# Patient Record
Sex: Female | Born: 1990 | Hispanic: No | Marital: Single | Smoking: Current every day smoker
Health system: Southern US, Community
[De-identification: ages and names within clinical notes are randomized; demographics above are authoritative.]

## PROBLEM LIST (undated history)

## (undated) DIAGNOSIS — Z87898 Personal history of other specified conditions: Secondary | ICD-10-CM

## (undated) DIAGNOSIS — F419 Anxiety disorder, unspecified: Secondary | ICD-10-CM

## (undated) DIAGNOSIS — K219 Gastro-esophageal reflux disease without esophagitis: Secondary | ICD-10-CM

## (undated) DIAGNOSIS — T7840XA Allergy, unspecified, initial encounter: Secondary | ICD-10-CM

## (undated) DIAGNOSIS — I459 Conduction disorder, unspecified: Secondary | ICD-10-CM

## (undated) DIAGNOSIS — J45909 Unspecified asthma, uncomplicated: Secondary | ICD-10-CM

## (undated) DIAGNOSIS — N12 Tubulo-interstitial nephritis, not specified as acute or chronic: Secondary | ICD-10-CM

## (undated) DIAGNOSIS — M1990 Unspecified osteoarthritis, unspecified site: Secondary | ICD-10-CM

## (undated) HISTORY — DX: Unspecified osteoarthritis, unspecified site: M19.90

## (undated) HISTORY — DX: Allergy, unspecified, initial encounter: T78.40XA

## (undated) HISTORY — DX: Personal history of other specified conditions: Z87.898

---

## 2015-01-01 ENCOUNTER — Emergency Department: Payer: Self-pay | Admitting: Emergency Medicine

## 2015-01-01 LAB — CBC WITH DIFFERENTIAL/PLATELET
BASOS ABS: 0 10*3/uL (ref 0.0–0.1)
BASOS PCT: 0.3 %
Eosinophil #: 0.1 10*3/uL (ref 0.0–0.7)
Eosinophil %: 1.1 %
HCT: 43.9 % (ref 35.0–47.0)
HGB: 14.1 g/dL (ref 12.0–16.0)
LYMPHS PCT: 11.7 %
Lymphocyte #: 1.1 10*3/uL (ref 1.0–3.6)
MCH: 26.8 pg (ref 26.0–34.0)
MCHC: 32 g/dL (ref 32.0–36.0)
MCV: 84 fL (ref 80–100)
Monocyte #: 0.8 x10 3/mm (ref 0.2–0.9)
Monocyte %: 8.2 %
NEUTROS PCT: 78.7 %
Neutrophil #: 7.4 10*3/uL — ABNORMAL HIGH (ref 1.4–6.5)
Platelet: 220 10*3/uL (ref 150–440)
RBC: 5.25 10*6/uL — ABNORMAL HIGH (ref 3.80–5.20)
RDW: 15.1 % — AB (ref 11.5–14.5)
WBC: 9.4 10*3/uL (ref 3.6–11.0)

## 2015-01-01 LAB — COMPREHENSIVE METABOLIC PANEL
ALBUMIN: 3.3 g/dL — AB (ref 3.4–5.0)
ALT: 34 U/L (ref 14–63)
AST: 24 U/L (ref 15–37)
Alkaline Phosphatase: 70 U/L (ref 46–116)
Anion Gap: 8 (ref 7–16)
BILIRUBIN TOTAL: 0.3 mg/dL (ref 0.2–1.0)
BUN: 10 mg/dL (ref 7–18)
CO2: 23 mmol/L (ref 21–32)
Calcium, Total: 8.6 mg/dL (ref 8.5–10.1)
Chloride: 107 mmol/L (ref 98–107)
Creatinine: 0.55 mg/dL — ABNORMAL LOW (ref 0.60–1.30)
EGFR (Non-African Amer.): >60
Glucose: 109 mg/dL — ABNORMAL HIGH (ref 65–99)
Osmolality: 275 (ref 275–301)
Potassium: 4.1 mmol/L (ref 3.5–5.1)
SODIUM: 138 mmol/L (ref 136–145)
Total Protein: 7.6 g/dL (ref 6.4–8.2)

## 2015-01-01 LAB — URINALYSIS, COMPLETE
Bacteria: NONE SEEN
Bilirubin,UR: NEGATIVE
Glucose,UR: NEGATIVE mg/dL (ref 0–75)
Ketone: NEGATIVE
NITRITE: NEGATIVE
Ph: 5 (ref 4.5–8.0)
Protein: 100
RBC,UR: 7779 /HPF (ref 0–5)
SPECIFIC GRAVITY: 1.032 (ref 1.003–1.030)
Squamous Epithelial: 6

## 2015-01-01 LAB — LIPASE, BLOOD: Lipase: 91 U/L (ref 73–393)

## 2015-04-30 ENCOUNTER — Emergency Department
Admission: EM | Admit: 2015-04-30 | Discharge: 2015-04-30 | Disposition: A | Discharge status: Home / Self Care | Payer: Self-pay | Attending: Emergency Medicine | Admitting: Emergency Medicine

## 2015-04-30 DIAGNOSIS — J029 Acute pharyngitis, unspecified: Secondary | ICD-10-CM | POA: Insufficient documentation

## 2015-04-30 DIAGNOSIS — R300 Dysuria: Secondary | ICD-10-CM | POA: Insufficient documentation

## 2015-04-30 DIAGNOSIS — Z3202 Encounter for pregnancy test, result negative: Secondary | ICD-10-CM | POA: Insufficient documentation

## 2015-04-30 DIAGNOSIS — Z79899 Other long term (current) drug therapy: Secondary | ICD-10-CM | POA: Insufficient documentation

## 2015-04-30 DIAGNOSIS — R109 Unspecified abdominal pain: Secondary | ICD-10-CM | POA: Insufficient documentation

## 2015-04-30 LAB — URINALYSIS COMPLETE WITH MICROSCOPIC (ARMC ONLY)
BILIRUBIN URINE: NEGATIVE
GLUCOSE, UA: NEGATIVE mg/dL
Hgb urine dipstick: NEGATIVE
Ketones, ur: NEGATIVE mg/dL
Nitrite: NEGATIVE
PH: 5 (ref 5.0–8.0)
Protein, ur: NEGATIVE mg/dL
SPECIFIC GRAVITY, URINE: 1.025 (ref 1.005–1.030)

## 2015-04-30 LAB — POCT RAPID STREP A: STREPTOCOCCUS, GROUP A SCREEN (DIRECT): NEGATIVE

## 2015-04-30 LAB — PREGNANCY, URINE: Preg Test, Ur: NEGATIVE

## 2015-04-30 MED ORDER — IBUPROFEN 800 MG PO TABS
800.0000 mg | ORAL_TABLET | Freq: Once | ORAL | Status: AC
  Administered 2015-04-30: 800 mg via ORAL

## 2015-04-30 MED ORDER — METHOCARBAMOL 500 MG PO TABS
1000.0000 mg | ORAL_TABLET | Freq: Once | ORAL | Status: AC
  Administered 2015-04-30: 1000 mg via ORAL

## 2015-04-30 MED ORDER — IBUPROFEN 800 MG PO TABS
ORAL_TABLET | ORAL | Status: AC
  Administered 2015-04-30: 800 mg via ORAL
  Filled 2015-04-30: qty 1

## 2015-04-30 MED ORDER — IBUPROFEN 800 MG PO TABS: 800.0000 mg | ORAL_TABLET | Freq: Three times a day (TID) | ORAL | Status: DC | PRN

## 2015-04-30 MED ORDER — METHOCARBAMOL 500 MG PO TABS
ORAL_TABLET | ORAL | Status: AC
  Administered 2015-04-30: 1000 mg via ORAL
  Filled 2015-04-30: qty 2

## 2015-04-30 MED ORDER — CYCLOBENZAPRINE HCL 10 MG PO TABS: 10.0000 mg | ORAL_TABLET | Freq: Three times a day (TID) | ORAL | Status: DC | PRN

## 2015-04-30 MED ORDER — MAGIC MOUTHWASH W/LIDOCAINE: 5.0000 mL | Freq: Four times a day (QID) | ORAL | Status: DC

## 2015-04-30 MED ORDER — MAGIC MOUTHWASH
10.0000 mL | Freq: Once | ORAL | Status: AC
  Administered 2015-04-30: 10 mL via ORAL
  Filled 2015-04-30: qty 10

## 2015-04-30 NOTE — ED Provider Notes (Signed)
[facility] Emergency Department Provider Note  ____________________________________________  Time seen: Approximately 10:42 PM  I have reviewed the triage vital signs and the nursing notes.   HISTORY  Chief Complaint Abdominal Pain    HPI Samantha Villegas is a 24 y.o. female patient complaining of sore throat chills right flank pain with urinary frequency and intermittent fever. Patient state preschool she is diagnosed bacterial vaginitis and treated. Patient states since then she isn't having increased frequency and mild dysuria. Patient states onset of sore throat for 2 days no other cold symptoms. She denies any nausea vomiting diarrhea. States sore throat increases with solid food but able to tolerate fluids.   No past medical history on file.  There are no active problems to display for this patient.   No past surgical history on file.  Current Outpatient Rx  Name  Route  Sig  Dispense  Refill  . Alum & Mag Hydroxide-Simeth (MAGIC MOUTHWASH W/LIDOCAINE) SOLN   Oral   Take 5 mLs by mouth 4 (four) times daily.   100 mL   0   . cyclobenzaprine (FLEXERIL) 10 MG tablet   Oral   Take 1 tablet (10 mg total) by mouth every 8 (eight) hours as needed for muscle spasms.   15 tablet   0   . ibuprofen (ADVIL,MOTRIN) 800 MG tablet   Oral   Take 1 tablet (800 mg total) by mouth every 8 (eight) hours as needed for moderate pain.   15 tablet   0     Allergies Review of patient's allergies indicates no known allergies.  No family history on file.  Social History History  Substance Use Topics  . Smoking status: Not on file  . Smokeless tobacco: Not on file  . Alcohol Use: Not on file    Review of Systems Constitutional: States chills. Eyes: No visual changes. ENT:  sore throat. Cardiovascular: Denies chest pain. Respiratory: Denies shortness of breath. Gastrointestinal: No abdominal pain.  No nausea, no vomiting.  No diarrhea.  No  constipation. Genitourinary: Positive for dysuria. Musculoskeletal: Positive for right flank pain. Skin: Negative for rash. Neurological: Negative for headaches, focal weakness or numbness. { 10-point ROS otherwise negative.  ____________________________________________   PHYSICAL EXAM:  VITAL SIGNS: ED Triage Vitals  Enc Vitals Group     BP 04/30/15 2102 126/99 mmHg     Pulse Rate 04/30/15 2102 94     Resp 04/30/15 2102 18     Temp 04/30/15 2102 98.9 F (37.2 C)     Temp src --      SpO2 04/30/15 2102 100 %     Weight 04/30/15 2102 263 lb (119.296 kg)     Height 04/30/15 2102  (1.676 m)     Head Cir --      Peak Flow --      Pain Score 04/30/15 2103 8     Pain Loc --      Pain Edu? --      Excl. in GC? --     Constitutional: Alert and oriented. Well appearing and in no acute distress. Eyes: Conjunctivae are normal. PERRL. EOMI. Head: Atraumatic. Nose: No congestion/rhinnorhea. Mouth/Throat: Mucous membranes are moist.  Oropharynx non-erythematous. Edematous tonsils without exudate. Neck: No stridor.  No spinal deformity no guarding with palpation free and equal range of motion. Hematological/Lymphatic/Immunilogical: No cervical lymphadenopathy. Cardiovascular: Normal rate, regular rhythm. Grossly normal heart sounds.  Good peripheral circulation. Respiratory: Normal respiratory effort.  No retractions. Lungs  CTAB. Gastrointestinal: Soft and nontender. No distention. No abdominal bruits. No CVA tenderness. Genitourinary: Mild guarding palpation right CVA area Musculoskeletal: No lower extremity tenderness nor edema.  No joint effusions. Neurologic:  Normal speech and language. No gross focal neurologic deficits are appreciated. Speech is normal. No gait instability. Skin:  Skin is warm, dry and intact. No rash noted. Psychiatric: Mood and affect are normal. Speech and behavior are normal.  ____________________________________________   LABS (all labs ordered  are listed, but only abnormal results are displayed)  Labs Reviewed  URINALYSIS COMPLETEWITH MICROSCOPIC ([facility])  - Abnormal; Notable for the following:    Color, Urine YELLOW (*)    APPearance CLEAR (*)    Leukocytes, UA 1+ (*)    Bacteria, UA RARE (*)    Squamous Epithelial / LPF 0-5 (*)    All other components within normal limits  PREGNANCY, URINE  POCT RAPID STREP A   ____________________________________________  EKG   ____________________________________________  RADIOLOGY   ____________________________________________   PROCEDURES  Procedure(s) performed: None  Critical Care performed: No  ____________________________________________   INITIAL IMPRESSION / ASSESSMENT AND PLAN / ED COURSE  Pertinent labs & imaging results that were available during my care of the patient were reviewed by me and considered in my medical decision making (see chart for details).  Pharyngitis Back pain ____________________________________________   FINAL CLINICAL IMPRESSION(S) / ED DIAGNOSES  Final diagnoses:  Acute pharyngitis, unspecified pharyngitis type  Right flank pain      Joni ReiningRonald K Smith, PA-C 04/30/15 2303  Loleta Roseory Forbach, MD 04/30/15 [phone]

## 2015-04-30 NOTE — ED Notes (Signed)
Pt in with co right flank pain, chills, sore throat, urinary frequency and fever.

## 2015-04-30 NOTE — Discharge Instructions (Signed)
Take medications as directed

## 2015-05-09 LAB — CULTURE, GROUP A STREP (THRC)

## 2015-07-11 LAB — HM HIV SCREENING LAB: HM HIV Screening: NEGATIVE

## 2016-08-23 ENCOUNTER — Emergency Department: Payer: Self-pay

## 2016-08-23 ENCOUNTER — Emergency Department
Admission: EM | Admit: 2016-08-23 | Discharge: 2016-08-23 | Disposition: A | Discharge status: Home / Self Care | Payer: Self-pay | Attending: Emergency Medicine | Admitting: Emergency Medicine

## 2016-08-23 ENCOUNTER — Encounter: Payer: Self-pay | Admitting: Emergency Medicine

## 2016-08-23 DIAGNOSIS — R0789 Other chest pain: Secondary | ICD-10-CM

## 2016-08-23 DIAGNOSIS — Z791 Long term (current) use of non-steroidal anti-inflammatories (NSAID): Secondary | ICD-10-CM | POA: Insufficient documentation

## 2016-08-23 DIAGNOSIS — F17210 Nicotine dependence, cigarettes, uncomplicated: Secondary | ICD-10-CM | POA: Insufficient documentation

## 2016-08-23 DIAGNOSIS — J40 Bronchitis, not specified as acute or chronic: Secondary | ICD-10-CM | POA: Insufficient documentation

## 2016-08-23 HISTORY — DX: Tubulo-interstitial nephritis, not specified as acute or chronic: N12

## 2016-08-23 LAB — BASIC METABOLIC PANEL
Anion gap: 5 (ref 5–15)
BUN: 10 mg/dL (ref 6–20)
CALCIUM: 8.4 mg/dL — AB (ref 8.9–10.3)
CHLORIDE: 108 mmol/L (ref 101–111)
CO2: 24 mmol/L (ref 22–32)
CREATININE: 0.71 mg/dL (ref 0.44–1.00)
GFR calc Af Amer: >60 mL/min (ref >60)
GFR calc non Af Amer: >60 mL/min (ref >60)
GLUCOSE: 102 mg/dL — AB (ref 65–99)
Potassium: 3.7 mmol/L (ref 3.5–5.1)
Sodium: 137 mmol/L (ref 135–145)

## 2016-08-23 LAB — CBC
HCT: 37.6 % (ref 35.0–47.0)
Hemoglobin: 13 g/dL (ref 12.0–16.0)
MCH: 27.4 pg (ref 26.0–34.0)
MCHC: 34.5 g/dL (ref 32.0–36.0)
MCV: 79.5 fL — AB (ref 80.0–100.0)
Platelets: 201 10*3/uL (ref 150–440)
RBC: 4.73 MIL/uL (ref 3.80–5.20)
RDW: 16.2 % — ABNORMAL HIGH (ref 11.5–14.5)
WBC: 4.5 10*3/uL (ref 3.6–11.0)

## 2016-08-23 LAB — TROPONIN I

## 2016-08-23 LAB — URINALYSIS COMPLETE WITH MICROSCOPIC (ARMC ONLY)
BILIRUBIN URINE: NEGATIVE
Glucose, UA: NEGATIVE mg/dL
HGB URINE DIPSTICK: NEGATIVE
Ketones, ur: NEGATIVE mg/dL
Nitrite: NEGATIVE
Protein, ur: NEGATIVE mg/dL
Specific Gravity, Urine: 1.024 (ref 1.005–1.030)
pH: 6 (ref 5.0–8.0)

## 2016-08-23 LAB — LIPASE, BLOOD: LIPASE: 22 U/L (ref 11–51)

## 2016-08-23 LAB — POCT PREGNANCY, URINE: Preg Test, Ur: NEGATIVE

## 2016-08-23 MED ORDER — AZITHROMYCIN 250 MG PO TABS: ORAL_TABLET | ORAL | 0 refills | Status: AC

## 2016-08-23 MED ORDER — KETOROLAC TROMETHAMINE 30 MG/ML IJ SOLN
30.0000 mg | Freq: Once | INTRAMUSCULAR | Status: AC
  Administered 2016-08-23: 30 mg via INTRAVENOUS
  Filled 2016-08-23: qty 1

## 2016-08-23 MED ORDER — ACETAMINOPHEN 325 MG PO TABS
ORAL_TABLET | ORAL | Status: AC
  Filled 2016-08-23: qty 2

## 2016-08-23 MED ORDER — TRAMADOL HCL 50 MG PO TABS: 50.0000 mg | ORAL_TABLET | Freq: Four times a day (QID) | ORAL | 0 refills | Status: DC | PRN

## 2016-08-23 MED ORDER — ACETAMINOPHEN 325 MG PO TABS
650.0000 mg | ORAL_TABLET | Freq: Once | ORAL | Status: AC | PRN
  Administered 2016-08-23: 650 mg via ORAL

## 2016-08-23 MED ORDER — OXYCODONE-ACETAMINOPHEN 5-325 MG PO TABS
1.0000 | ORAL_TABLET | Freq: Once | ORAL | Status: AC
  Administered 2016-08-23: 1 via ORAL
  Filled 2016-08-23: qty 1

## 2016-08-23 MED ORDER — SODIUM CHLORIDE 0.9 % IV BOLUS (SEPSIS)
1000.0000 mL | Freq: Once | INTRAVENOUS | Status: AC
  Administered 2016-08-23: 1000 mL via INTRAVENOUS

## 2016-08-23 NOTE — ED Notes (Signed)
Pt c/o chest pain, midsternal, worse with inspiration, and back pain crossing L to R over where pt states she had a spinal tap done in March 2017. Pt denies any c/o abd pain at this time.

## 2016-08-23 NOTE — ED Provider Notes (Signed)
[facility] Emergency Department Provider Note  ____________________________________________   I have reviewed the triage vital signs and the nursing notes.   HISTORY  Chief Complaint Back Pain and Abdominal Pain    HPI Samantha Villegas is a 25 y.o. female who is healthy aside from morbid obesity, is a smoker. States that she started having a cough a few days ago which is productive of greenish mucus and rhinorrhea. She started having a fever today. She states that when she coughed "hard" this morning she began to have pain in her ribs and the right back. She states that she has not no leg swelling no recent travel no personal or family history of PE or DVT she is not on birth control denies pregnancy.  The pain is sharp, began this point with a cough, to the right ribs. It is worse when she changes position.   Past Medical History:  Diagnosis Date  . Pyelonephritis     There are no active problems to display for this patient.   Past Surgical History:  Procedure Laterality Date  . CESAREAN SECTION      Prior to Admission medications   Medication Sig Start Date End Date Taking? Authorizing Provider  Alum & Mag Hydroxide-Simeth (MAGIC MOUTHWASH W/LIDOCAINE) SOLN Take 5 mLs by mouth 4 (four) times daily. 04/30/15   Joni Reiningonald K Smith, PA-C  cyclobenzaprine (FLEXERIL) 10 MG tablet Take 1 tablet (10 mg total) by mouth every 8 (eight) hours as needed for muscle spasms. 04/30/15   Joni Reiningonald K Smith, PA-C  ibuprofen (ADVIL,MOTRIN) 800 MG tablet Take 1 tablet (800 mg total) by mouth every 8 (eight) hours as needed for moderate pain. 04/30/15   Joni Reiningonald K Smith, PA-C    Allergies Review of patient's allergies indicates no known allergies.  No family history on file.  Social History Social History  Substance Use Topics  . Smoking status: Current Every Day Smoker    Packs/day: 1.00    Types: Cigarettes  . Smokeless tobacco: Never Used  . Alcohol use  No    Review of Systems Constitutional: Positive fever no chills  Eyes: No visual changes. ENT: No sore throat. No stiff neck no neck pain Cardiovascular: Positive chest pain Respiratory: Denies shortness of breath. Positive cough Gastrointestinal:   no vomiting.  No diarrhea.  No constipation. Genitourinary: Negative for dysuria. Musculoskeletal: Negative lower extremity swelling Skin: Negative for rash. Neurological: Negative for severe headaches, focal weakness or numbness. 10-point ROS otherwise negative.  ____________________________________________   PHYSICAL EXAM:  VITAL SIGNS: ED Triage Vitals  Enc Vitals Group     BP 08/23/16 1749 121/69     Pulse Rate 08/23/16 1747 (!) 105     Resp 08/23/16 1747 16     Temp 08/23/16 1747 (!) 103 F (39.4 C)     Temp Source 08/23/16 1747 Oral     SpO2 08/23/16 1747 100 %     Weight 08/23/16 1747 250 lb (113.4 kg)     Height 08/23/16 1747 5\' 5"  (1.651 m)     Head Circumference --      Peak Flow --      Pain Score 08/23/16 1748 9     Pain Loc --      Pain Edu? --      Excl. in GC? --     Constitutional: Alert and oriented. Well appearing and in no acute distress.Walking to the room playing her cell phone and when I enter Eyes: Conjunctivae are  normal. PERRL. EOMI. Head: Atraumatic. Nose: Mild clear congestion/rhinnorhea. Mouth/Throat: Mucous membranes are moist.  Oropharynx non-erythematous. Neck: No stridor.   Nontender with no meningismus Chest: Tender to palpation in the ribs on the right upper mid back. I touch this area patient states "ouch that's the pain right there". There is no CVA tenderness. There is no flail chest. There is no crepitus. Cardiovascular: Normal rate, regular rhythm. Grossly normal heart sounds.  Good peripheral circulation. Respiratory: Normal respiratory effort.  No retractions. Lungs CTAB. Abdominal: Soft and nontender. No distention. No guarding no rebound Back:  See above,  there is no midline  tenderness there are no lesions noted. there is no CVA tenderness Musculoskeletal: No lower extremity tenderness, no upper extremity tenderness. No joint effusions, no DVT signs strong distal pulses no edema Neurologic:  Normal speech and language. No gross focal neurologic deficits are appreciated.  Skin:  Skin is warm, dry and intact. No rash noted. Psychiatric: Mood and affect are normal. Speech and behavior are normal.  ____________________________________________   LABS (all labs ordered are listed, but only abnormal results are displayed)  Labs Reviewed  BASIC METABOLIC PANEL - Abnormal; Notable for the following:       Result Value   Glucose, Bld 102 (*)    Calcium 8.4 (*)    All other components within normal limits  CBC - Abnormal; Notable for the following:    MCV 79.5 (*)    RDW 16.2 (*)    All other components within normal limits  URINALYSIS COMPLETEWITH MICROSCOPIC ([facility] ONLY) - Abnormal; Notable for the following:    Color, Urine YELLOW (*)    APPearance CLEAR (*)    Leukocytes, UA TRACE (*)    Bacteria, UA RARE (*)    Squamous Epithelial / LPF 0-5 (*)    All other components within normal limits  URINE CULTURE  LIPASE, BLOOD  TROPONIN I  POC URINE PREG, ED  POCT PREGNANCY, URINE   ____________________________________________  EKG  I personally interpreted any EKGs ordered by me or triage Normal sinus rhythm at 92 bpm no acute ST elevation or acute ST depression normal axis unremarkable EKG ____________________________________________  RADIOLOGY  I reviewed any imaging ordered by me or triage that were performed during my shift and, if possible, patient and/or family made aware of any abnormal findings. ____________________________________________   PROCEDURES  Procedure(s) performed: None  Procedures  Critical Care performed: None  ____________________________________________   INITIAL IMPRESSION / ASSESSMENT AND PLAN / ED  COURSE  Pertinent labs & imaging results that were available during my care of the patient were reviewed by me and considered in my medical decision making (see chart for details).  Patient with very reproducible chest wall pain in the context of fever rhinorrhea and cough.At this time, there does not appear to be clinical evidence to support the diagnosis of pulmonary embolus, dissection, myocarditis, endocarditis, pericarditis, pericardial tamponade, acute coronary syndrome, pneumothorax, pneumonia, or any other acute intrathoracic pathology that will require admission or acute intervention. Nor is there evidence of any significant intra-abdominal pathology causing this discomfort. Chest x-ray is reassuring. No evidence of pyelonephritis. I do not believe the patient is a blood clot. She is very clear cough fever origin to this refusal chest wall pain. She does have a slight elevated heart rate without the context of a fever. No other obvious origin of fevers noted. Patient does not have any evidence of pyelonephritis her abdomen is completely benign to me and the nurses here  she adamantly denies having any abdominal pain. Serial abdominal exams are reassuring. She also denies vaginal discharge. She is a smoker with a productive cough and high fever will treat her for atypical pneumonia with Zithromax and fever control return precautions and follow-up given and understood.  Clinical Course   ____________________________________________   FINAL CLINICAL IMPRESSION(S) / ED DIAGNOSES  Final diagnoses:  None      This chart was dictated using voice recognition software.  Despite best efforts to proofread,  errors can occur which can change meaning.      [name], MD 08/23/16 [phone]

## 2016-08-23 NOTE — ED Triage Notes (Addendum)
Woke up this morning with low back pain and abdominal pain.  Denies dysuria.  Had urine checked at PCP for about 3 weeks ago, urine negative.  Also c/o cough and pain to chest with coughing and deep breathing.

## 2016-08-25 LAB — URINE CULTURE

## 2016-10-10 ENCOUNTER — Emergency Department
Admission: EM | Admit: 2016-10-10 | Discharge: 2016-10-10 | Disposition: A | Discharge status: Home / Self Care | Payer: Medicaid - Out of State | Attending: Emergency Medicine | Admitting: Emergency Medicine

## 2016-10-10 ENCOUNTER — Encounter: Payer: Self-pay | Admitting: Urgent Care

## 2016-10-10 DIAGNOSIS — N390 Urinary tract infection, site not specified: Secondary | ICD-10-CM | POA: Insufficient documentation

## 2016-10-10 DIAGNOSIS — N3289 Other specified disorders of bladder: Secondary | ICD-10-CM

## 2016-10-10 DIAGNOSIS — Z791 Long term (current) use of non-steroidal anti-inflammatories (NSAID): Secondary | ICD-10-CM | POA: Insufficient documentation

## 2016-10-10 DIAGNOSIS — Z79899 Other long term (current) drug therapy: Secondary | ICD-10-CM | POA: Insufficient documentation

## 2016-10-10 DIAGNOSIS — F17210 Nicotine dependence, cigarettes, uncomplicated: Secondary | ICD-10-CM | POA: Insufficient documentation

## 2016-10-10 LAB — PREGNANCY, URINE: Preg Test, Ur: NEGATIVE

## 2016-10-10 LAB — URINALYSIS COMPLETE WITH MICROSCOPIC (ARMC ONLY)
BILIRUBIN URINE: NEGATIVE
Bacteria, UA: NONE SEEN
GLUCOSE, UA: NEGATIVE mg/dL
NITRITE: NEGATIVE
Protein, ur: NEGATIVE mg/dL
Specific Gravity, Urine: 1.02 (ref 1.005–1.030)
pH: 5 (ref 5.0–8.0)

## 2016-10-10 LAB — GLUCOSE, CAPILLARY: Glucose-Capillary: 81 mg/dL (ref 65–99)

## 2016-10-10 MED ORDER — PHENAZOPYRIDINE HCL 100 MG PO TABS: 95.0000 mg | ORAL_TABLET | Freq: Once | ORAL | Status: DC

## 2016-10-10 MED ORDER — PHENAZOPYRIDINE HCL 200 MG PO TABS
ORAL_TABLET | ORAL | Status: AC
  Filled 2016-10-10: qty 1

## 2016-10-10 MED ORDER — AMOXICILLIN-POT CLAVULANATE 875-125 MG PO TABS: 1.0000 | ORAL_TABLET | Freq: Two times a day (BID) | ORAL | 0 refills | Status: DC

## 2016-10-10 MED ORDER — PHENAZOPYRIDINE HCL 200 MG PO TABS
200.0000 mg | ORAL_TABLET | Freq: Once | ORAL | Status: AC
  Administered 2016-10-10: 200 mg via ORAL

## 2016-10-10 MED ORDER — AMOXICILLIN-POT CLAVULANATE 875-125 MG PO TABS
1.0000 | ORAL_TABLET | Freq: Once | ORAL | Status: DC
  Filled 2016-10-10: qty 1

## 2016-10-10 NOTE — ED Notes (Signed)
Patient reports earlier this evening had lower abdominal pain and urinary pressure and dysuria.  Reports approximately 10 minutes after using the rest room had lower abdomen pain (similar as when she had her IUD placed) and lower back pain.  Patient denies nausea, vomiting or diarrhea.

## 2016-10-10 NOTE — ED Triage Notes (Signed)
Patient presents with dysuria that started yesterday; denies gross hematuria. (+) lower abdominal and back pain that worsened x 30 mins PTA.

## 2016-10-10 NOTE — ED Provider Notes (Signed)
[facility] Emergency Department Provider Note  ____________________________________________   First MD Initiated Contact with Patient 10/10/16 2217     (approximate)  I have reviewed the triage vital signs and the nursing notes.   HISTORY  Chief Complaint Dysuria    HPI Samantha Villegas is a 25 y.o. female this at work today, she went to urinate and noticed it burned when she urinated and then shortly thereafter she developed the severe pain in her lower abdomen with radiation towards her back. She began feeling a slight discomfort with urination yesterday. No blood in her urine. This went away after about 10 minutes, but she reports burning with urination and discomfort and feels she might have a "bladder infection"  Denies nausea or vomiting. No fevers or chills. Not having any pain or discomfort now  No chest pain or shortness of breath. No cough.   Past Medical History:  Diagnosis Date  . Pyelonephritis     There are no active problems to display for this patient.   Past Surgical History:  Procedure Laterality Date  . CESAREAN SECTION      Prior to Admission medications   Medication Sig Start Date End Date Taking? Authorizing Provider  Alum & Mag Hydroxide-Simeth (MAGIC MOUTHWASH W/LIDOCAINE) SOLN Take 5 mLs by mouth 4 (four) times daily. 04/30/15   Joni Reiningonald K Smith, PA-C  amoxicillin-clavulanate (AUGMENTIN) 875-125 MG tablet Take 1 tablet by mouth 2 (two) times daily. 10/10/16   Sharyn CreamerMark Kroy Sprung, MD  cyclobenzaprine (FLEXERIL) 10 MG tablet Take 1 tablet (10 mg total) by mouth every 8 (eight) hours as needed for muscle spasms. 04/30/15   Joni Reiningonald K Smith, PA-C  ibuprofen (ADVIL,MOTRIN) 800 MG tablet Take 1 tablet (800 mg total) by mouth every 8 (eight) hours as needed for moderate pain. 04/30/15   Joni Reiningonald K Smith, PA-C  traMADol (ULTRAM) 50 MG tablet Take 1 tablet (50 mg total) by mouth every 6 (six) hours as needed. 08/23/16 08/23/17  Jeanmarie PlantJames A McShane,  MD    Allergies Review of patient's allergies indicates no known allergies.  No family history on file.  Social History Social History  Substance Use Topics  . Smoking status: Current Every Day Smoker    Packs/day: 1.00    Types: Cigarettes  . Smokeless tobacco: Never Used  . Alcohol use No    Review of Systems Constitutional: No fever/chills Eyes: No visual changes. ENT: No sore throat. Cardiovascular: Denies chest pain. Respiratory: Denies shortness of breath. Gastrointestinal: No nausea, no vomiting.  No diarrhea.  No constipation. Genitourinary: See history of present illness Musculoskeletal: Negative for back pain. Skin: Negative for rash. Neurological: Negative for headaches, focal weakness or numbness.  10-point ROS otherwise negative.  ____________________________________________   PHYSICAL EXAM:  VITAL SIGNS: ED Triage Vitals  Enc Vitals Group     BP 10/10/16 2106 (!) 135/97     Pulse Rate 10/10/16 2106 75     Resp 10/10/16 2106 18     Temp 10/10/16 2106 98.6 F (37 C)     Temp Source 10/10/16 2106 Oral     SpO2 10/10/16 2106 100 %     Weight 10/10/16 2107 250 lb (113.4 kg)     Height 10/10/16 2107 5\' 4"  (1.626 m)     Head Circumference --      Peak Flow --      Pain Score 10/10/16 2107 6     Pain Loc --      Pain Edu? --  Excl. in GC? --     Constitutional: Alert and oriented. Well appearing and in no acute distress. Eyes: Conjunctivae are normal. PERRL. EOMI. Head: Atraumatic. Nose: No congestion/rhinnorhea. Mouth/Throat: Mucous membranes are moist.  Neck: No stridor.   Cardiovascular: Normal rate, regular rhythm. Grossly normal heart sounds.  Good peripheral circulation. Respiratory: Normal respiratory effort.  No retractions. Lungs CTAB. Gastrointestinal: Soft and nontender. No distention. No CVA tenderness. Negative Murphy. No pain at McBurney's point Musculoskeletal: No lower extremity tenderness nor edema.  No joint  effusions. Neurologic:  Normal speech and language. No gross focal neurologic deficits are appreciated. No gait instability. Skin:  Skin is warm, dry and intact. No rash noted. Psychiatric: Mood and affect are normal. Speech and behavior are normal.  ____________________________________________   LABS (all labs ordered are listed, but only abnormal results are displayed)  Labs Reviewed  URINALYSIS COMPLETEWITH MICROSCOPIC ([facility] ONLY) - Abnormal; Notable for the following:       Result Value   Color, Urine YELLOW (*)    APPearance HAZY (*)    Ketones, ur TRACE (*)    Hgb urine dipstick 1+ (*)    Leukocytes, UA 1+ (*)    Squamous Epithelial / LPF 0-5 (*)    All other components within normal limits  URINE CULTURE  PREGNANCY, URINE  GLUCOSE, CAPILLARY  CBG MONITORING, ED   ____________________________________________  EKG   ____________________________________________  RADIOLOGY   ____________________________________________   PROCEDURES  Procedure(s) performed: None  Procedures  Critical Care performed: No  ____________________________________________   INITIAL IMPRESSION / ASSESSMENT AND PLAN / ED COURSE  Pertinent labs & imaging results that were available during my care of the patient were reviewed by me and considered in my medical decision making (see chart for details).  Patient presents for evaluation after 1 day of dysuria, then after urinating today had an episode of severe pain lasted about 10 minutes in the lower abdomen. Appears most consistent with acute urinary tract infection, there is pyuria and the patient reports obvious dysuria. In addition, I suspect she likely had an episode of bladder spasm. No pain in all symptoms now resolved. We'll treat with Augmentin, Azo over-the-counter.  No CVA tenderness. Afebrile in no distress. No evidence of pyelonephritis, sepsis, or overwhelming infection. Patient presently not having any abdominal  pain.  Clinical Course     ____________________________________________   FINAL CLINICAL IMPRESSION(S) / ED DIAGNOSES  Final diagnoses:  Urinary tract infection, acute  Bladder spasm      NEW MEDICATIONS STARTED DURING THIS VISIT:  New Prescriptions   AMOXICILLIN-CLAVULANATE (AUGMENTIN) 875-125 MG TABLET    Take 1 tablet by mouth 2 (two) times daily.     Note:  This document was prepared using Dragon voice recognition software and may include unintentional dictation errors.     Sharyn CreamerMark Chesnee Floren, MD 10/10/16 2259

## 2016-10-10 NOTE — Discharge Instructions (Signed)
You have been seen in the Emergency Department (ED) today for pain when urinating.  Your workup today suggests that you have a urinary tract infection (UTI). ° ° °Call your regular doctor to schedule the next available appointment to follow up on today’s ED visit, or return immediately to the ED if your pain worsens, you have decreased urine production, develop fever, persistent vomiting, or other symptoms that concern you. ° °

## 2016-10-13 LAB — URINE CULTURE
Culture: >=100000 — AB
Special Requests: NORMAL

## 2017-01-04 ENCOUNTER — Emergency Department
Admission: EM | Admit: 2017-01-04 | Discharge: 2017-01-04 | Disposition: A | Discharge status: Home / Self Care | Payer: Self-pay | Attending: Emergency Medicine | Admitting: Emergency Medicine

## 2017-01-04 ENCOUNTER — Encounter: Payer: Self-pay | Admitting: Emergency Medicine

## 2017-01-04 DIAGNOSIS — H6981 Other specified disorders of Eustachian tube, right ear: Secondary | ICD-10-CM

## 2017-01-04 DIAGNOSIS — H6991 Unspecified Eustachian tube disorder, right ear: Secondary | ICD-10-CM | POA: Insufficient documentation

## 2017-01-04 DIAGNOSIS — F17210 Nicotine dependence, cigarettes, uncomplicated: Secondary | ICD-10-CM | POA: Insufficient documentation

## 2017-01-04 MED ORDER — FLUTICASONE PROPIONATE 50 MCG/ACT NA SUSP: 2.0000 | Freq: Every day | NASAL | 0 refills | Status: DC

## 2017-01-04 MED ORDER — PREDNISONE 10 MG PO TABS: ORAL_TABLET | ORAL | 0 refills | Status: DC

## 2017-01-04 NOTE — ED Provider Notes (Signed)
[facility] Emergency Department Provider Note   ____________________________________________   First MD Initiated Contact with Patient 01/04/17 1433     (approximate)  I have reviewed the triage vital signs and the nursing notes.   HISTORY  Chief Complaint Abscess   HPI Samantha Villegas is a 26 y.o. female  is here with complaint of right-sided neck pain. Patient states that she had a toothache approximately one week ago and that that resolved on its own. She continues to have right-sided neck pain. She denies any fever, chills, nausea or vomiting. There is been no sore throat. She has not taken any over-the-counter medication that has helped any. She denies any previous sensations such as this. She does have TMJ but states that this pain is different.   Past Medical History:  Diagnosis Date  . Pyelonephritis     There are no active problems to display for this patient.   Past Surgical History:  Procedure Laterality Date  . CESAREAN SECTION      Prior to Admission medications   Medication Sig Start Date End Date Taking? Authorizing Provider  fluticasone (FLONASE) 50 MCG/ACT nasal spray Place 2 sprays into both nostrils daily. 01/04/17 01/04/18  Tommi Rumps, PA-C  predniSONE (DELTASONE) 10 MG tablet Take 6 tablets  today, on day 2 take 5 tablets, day 3 take 4 tablets, day 4 take 3 tablets, day 5 take  2 tablets and 1 tablet the last day 01/04/17   Tommi Rumps, PA-C    Allergies Patient has no known allergies.  No family history on file.  Social History Social History  Substance Use Topics  . Smoking status: Current Every Day Smoker    Packs/day: 1.00    Types: Cigarettes  . Smokeless tobacco: Never Used  . Alcohol use No    Review of Systems Constitutional: No fever/chills ENT: No sore throat.Questionable right earache. Cardiovascular: Denies chest pain. Respiratory: Denies shortness of breath. Gastrointestinal:   No  nausea, no vomiting.  Skin: Negative for rash. Neurological: Negative for headaches, focal weakness or numbness.  10-point ROS otherwise negative.  ____________________________________________   PHYSICAL EXAM:  VITAL SIGNS: ED Triage Vitals  Enc Vitals Group     BP 01/04/17 1423 132/77     Pulse Rate 01/04/17 1423 78     Resp 01/04/17 1423 18     Temp 01/04/17 1423 97.4 F (36.3 C)     Temp Source 01/04/17 1423 Oral     SpO2 01/04/17 1423 99 %     Weight 01/04/17 1424 250 lb (113.4 kg)     Height 01/04/17 1424 5\' 5"  (1.651 m)     Head Circumference --      Peak Flow --      Pain Score --      Pain Loc --      Pain Edu? --      Excl. in GC? --     Constitutional: Alert and oriented. Well appearing and in no acute distress. Eyes: Conjunctivae are normal. PERRL. EOMI. Head: Atraumatic. Nose: No congestion/rhinnorhea. Left EAC and TM are clear. Right TM is dull with fluid level present. No erythema or injection seen. Patient was unable to get her ears to pop on the right side. Mouth/Throat: Mucous membranes are moist.  Oropharynx non-erythematous. No dental abscess present. Neck: No stridor.   Hematological/Lymphatic/Immunilogical: No cervical lymphadenopathy. Cardiovascular: Normal rate, regular rhythm. Grossly normal heart sounds.  Good peripheral circulation. Respiratory: Normal respiratory effort.  No retractions. Lungs CTAB. Musculoskeletal: Moves upper and lower extremities without any difficulty. Normal gait was noted. Neurologic:  Normal speech and language. No gross focal neurologic deficits are appreciated. No gait instability. Skin:  Skin is warm, dry and intact. No rash noted. Psychiatric: Mood and affect are normal. Speech and behavior are normal.  ____________________________________________   LABS (all labs ordered are listed, but only abnormal results are displayed)  Labs Reviewed - No data to display  PROCEDURES  Procedure(s) performed:  None  Procedures  Critical Care performed: No  ____________________________________________   INITIAL IMPRESSION / ASSESSMENT AND PLAN / ED COURSE  Pertinent labs & imaging results that were available during my care of the patient were reviewed by me and considered in my medical decision making (see chart for details).   Patient was started on prednisone taper along with Flonase nasal spray. Patient is also to get over-the-counter decongestant such as Sudafed if there is any congestion. She is to follow-up with [facility] if any continued problems.    ___________________________________________   FINAL CLINICAL IMPRESSION(S) / ED DIAGNOSES  Final diagnoses:  Eustachian tube dysfunction, right      NEW MEDICATIONS STARTED DURING THIS VISIT:  Discharge Medication List as of 01/04/2017  3:01 PM    START taking these medications   Details  fluticasone (FLONASE) 50 MCG/ACT nasal spray Place 2 sprays into both nostrils daily., Starting Mon 01/04/2017, Until Tue 01/04/2018, Print    predniSONE (DELTASONE) 10 MG tablet Take 6 tablets  today, on day 2 take 5 tablets, day 3 take 4 tablets, day 4 take 3 tablets, day 5 take  2 tablets and 1 tablet the last day, Print         Note:  This document was prepared using Dragon voice recognition software and may include unintentional dictation errors.    Tommi Rumpshonda L Summers, PA-C 01/04/17 1612    Jene Everyobert Kinner, MD 01/05/17 [phone]

## 2017-01-04 NOTE — ED Triage Notes (Signed)
States she had a toothache about 1 week ago  Tooth pain went away  But now having some tenderness to right side of neck

## 2017-01-04 NOTE — Discharge Instructions (Signed)
Follow up with DR. Juengel if any continued greater than 5-7 days Begin prednisone as directed today and flonase nasal spray as directed Tylenol as needed for pain

## 2017-01-16 ENCOUNTER — Emergency Department
Admission: EM | Admit: 2017-01-16 | Discharge: 2017-01-16 | Disposition: A | Discharge status: Home / Self Care | Payer: Self-pay | Attending: Emergency Medicine | Admitting: Emergency Medicine

## 2017-01-16 ENCOUNTER — Encounter: Payer: Self-pay | Admitting: Emergency Medicine

## 2017-01-16 DIAGNOSIS — K047 Periapical abscess without sinus: Secondary | ICD-10-CM | POA: Insufficient documentation

## 2017-01-16 DIAGNOSIS — K029 Dental caries, unspecified: Secondary | ICD-10-CM | POA: Insufficient documentation

## 2017-01-16 DIAGNOSIS — F17210 Nicotine dependence, cigarettes, uncomplicated: Secondary | ICD-10-CM | POA: Insufficient documentation

## 2017-01-16 MED ORDER — PENICILLIN V POTASSIUM 500 MG PO TABS: 500.0000 mg | ORAL_TABLET | Freq: Four times a day (QID) | ORAL | 0 refills | Status: DC

## 2017-01-16 MED ORDER — PENICILLIN V POTASSIUM 500 MG PO TABS
500.0000 mg | ORAL_TABLET | Freq: Once | ORAL | Status: AC
  Administered 2017-01-16: 500 mg via ORAL
  Filled 2017-01-16: qty 1

## 2017-01-16 NOTE — ED Notes (Signed)
Pt verbalized understanding of discharge instructions. NAD at this time. 

## 2017-01-16 NOTE — ED Triage Notes (Signed)
Pt to ed with c/o toothache,  States she was seen here last week for same.  Pt states she was started on prednisone and swelling went away but now swelling has returned.

## 2017-01-16 NOTE — Discharge Instructions (Signed)
Take the antibiotic as directed. Rinse with warm, salty water after every meal. Use a soft-bristled toothbrush twice daily. Consider using a temporary filling to close the broken tooth until you can be seen by one of the dental clinics listed below: OPTIONS FOR DENTAL FOLLOW UP CARE  [place] Department of Health and Human Services - Local Safety Net Dental Clinics TripDoors.comhttp://www.ncdhhs.gov/dph/oralhealth/services/safetynetclinics.htm   [facility] [phone])  [facility] [phone])  [facility] [phone]  [facility] [phone])  [facility] [phone]) This clinic caters to the indigent population and is on a lottery system. Location: [employer], [address], [address], [place] [facility] Hours: Wednesdays from 6pm - 9pm, patients seen by a lottery system. For dates, call or go to [email or web address] Services: Cleanings, fillings and simple extractions. Payment Options: DENTAL WORK IS FREE OF CHARGE. Bring proof of income or support. Best way to get seen: Arrive at 5:15 pm - this is a lottery, NOT first come/first serve, so arriving earlier will not increase your chances of being seen.     [facility] [phone] Select option 1 for emergencies   Location: [facility], [address], [place] [facility] Hours: No walk-ins accepted - call the day before to schedule an appointment. Check in times are 9:30 am and 1:30 pm. Services: Simple extractions, temporary fillings, pulpectomy/pulp debridement, uncomplicated abscess drainage. Payment Options: PAYMENT IS DUE AT THE TIME OF SERVICE.  Fee is usually $100-200, additional surgical procedures (e.g. abscess drainage) may be extra. Cash, checks, Visa/MasterCard accepted.  Can file Medicaid if patient is covered for dental - patient should call case worker  to check. No discount for [facility] patients. Best way to get seen: MUST call the day before and get onto the schedule. Can usually be seen the next 1-2 days. No walk-ins accepted.     [facility] [phone]   Location: [facility], [address], [place] [facility] Hours: M, W, Th, F 8am or 1:30pm, Tues 9a or 1:30 - first come/first served. Services: Simple extractions, temporary fillings, uncomplicated abscess drainage.  You do not need to be an [place] resident. Payment Options: PAYMENT IS DUE AT THE TIME OF SERVICE. Dental insurance, otherwise sliding scale - bring proof of income or support. Depending on income and treatment needed, cost is usually $50-200. Best way to get seen: Arrive early as it is first come/first served.     [facility] [phone]   Location: [address] Clinic Hours: Mon-Thu 8a-5p Services: Most basic dental services including extractions and fillings. Payment Options: PAYMENT IS DUE AT THE TIME OF SERVICE. Sliding scale, up to 50% off - bring proof if income or support. Medicaid with dental option accepted. Best way to get seen: Call to schedule an appointment, can usually be seen within 2 weeks OR they will try to see walk-ins - show up at 8a or 2p (you may have to wait).     [facility] [phone] [place] RESIDENTS ONLY   Location: [facility], [address], [place], [place] [address] Clinic Hours: By appointment only. Monday - Thursday 8am-5pm, Friday 8am-12pm Services: Cleanings, fillings, extractions. Payment Options: PAYMENT IS DUE AT THE TIME OF SERVICE. Cash, Visa or MasterCard. Sliding scale - $30 minimum per service. Best way to get seen: Come in to office, complete packet and make an appointment -  need proof of income or support monies for each household member and proof of [facility] [place]  [place] residence. Usually takes about a month to get in.     [facility] [phone]   Location: [address] [facility] Hours: Walk-in Urgent Care Dental Services are offered Monday-Friday mornings only. The numbers of emergencies accepted daily is limited to the number of providers available. Maximum 15 - Mondays, Wednesdays & Thursdays Maximum 10 - Tuesdays & Fridays Services: You do not need to be a [place] resident to be seen for a dental emergency. Emergencies are defined as pain, swelling, abnormal bleeding, or dental trauma. Walkins will receive x-rays if needed. NOTE: Dental cleaning is not an emergency. Payment Options: PAYMENT IS DUE AT THE TIME OF SERVICE. Minimum co-pay is $40.00 for uninsured patients. Minimum co-pay is $3.00 for Medicaid with dental coverage. Dental Insurance is accepted and must be presented at time of visit. Medicare does not cover dental. Forms of payment: Cash, credit card, checks. Best way to get seen: If not previously registered with the clinic, walk-in dental registration begins at 7:15 am and is on a first come/first serve basis. If previously registered with the clinic, call to make an appointment.     [facility] [phone] [place] RESIDENTS ONLY   Location: [address], [place], [place] Clinic Hours: Mon-Thu 10a-2p Services: Extractions only! Payment Options: FREE (donations accepted) - bring proof of income or support Best way to get seen: Call and schedule an appointment OR come at 8am on the 1st Monday of every month (except for holidays) when it is first come/first served.     [facility] [phone]   Location: [address], [place] [facility] Hours: Friday mornings Services, Payment Options, Best way to get seen: Call for info

## 2017-01-16 NOTE — ED Provider Notes (Signed)
[facility] Emergency Department Provider Note ____________________________________________  Time seen: 1035  I have reviewed the triage vital signs and the nursing notes.  HISTORY  Chief Complaint  Dental Pain   HPI Samantha Villegas is a 26 y.o. female presents to the ED for evalation of dental abscess to the right lower jaw. She confirms a chronically broken premolar. She has noted swelling to the lower jaw for 2 days. She recently completed a 5-day prednisone course, and the swelling started a day later. She denies fever, chills, or spontaneous drainage.   Past Medical History:  Diagnosis Date  . Pyelonephritis     There are no active problems to display for this patient.   Past Surgical History:  Procedure Laterality Date  . CESAREAN SECTION      Prior to Admission medications   Medication Sig Start Date End Date Taking? Authorizing Provider  fluticasone (FLONASE) 50 MCG/ACT nasal spray Place 2 sprays into both nostrils daily. 01/04/17 01/04/18  Tommi Rumpshonda L Summers, PA-C  penicillin v potassium (VEETID) 500 MG tablet Take 1 tablet (500 mg total) by mouth 4 (four) times daily. 01/16/17   Alexus Michael V Bacon Beckett Hickmon, PA-C  predniSONE (DELTASONE) 10 MG tablet Take 6 tablets  today, on day 2 take 5 tablets, day 3 take 4 tablets, day 4 take 3 tablets, day 5 take  2 tablets and 1 tablet the last day 01/04/17   Tommi Rumpshonda L Summers, PA-C    Allergies Patient has no known allergies.  History reviewed. No pertinent family history.  Social History Social History  Substance Use Topics  . Smoking status: Current Every Day Smoker    Packs/day: 1.00    Types: Cigarettes  . Smokeless tobacco: Never Used  . Alcohol use No    Review of Systems  Constitutional: Negative for fever. Eyes: Negative for visual changes. ENT: Negative for sore throat. Dental pain as above.  Cardiovascular: Negative for chest pain. Respiratory: Negative for shortness of  breath. Gastrointestinal: Negative for abdominal pain, vomiting and diarrhea. ____________________________________________  PHYSICAL EXAM:  VITAL SIGNS: ED Triage Vitals [01/16/17 0920]  Enc Vitals Group     BP 130/86     Pulse Rate 85     Resp 20     Temp 98.2 F (36.8 C)     Temp Source Oral     SpO2 100 %     Weight 250 lb (113.4 kg)     Height      Head Circumference      Peak Flow      Pain Score 7     Pain Loc      Pain Edu?      Excl. in GC?     Constitutional: Alert and oriented. Well appearing and in no distress. Head: Normocephalic and atraumatic. Eyes: Conjunctivae are normal. PERRL. Normal extraocular movements Ears: Canals clear. TMs intact bilaterally. Mouth/Throat: Mucous membranes are moist. Uvula is midline. Tonsils are flat. Oropharyngeal edema, erythema, or exudates. No brawny sublingual swelling. Right lower 2nd premolar with a chronic fracture noted. Local swelling noted to the buckle mucosa.  Neck: Supple. No thyromegaly. Hematological/Lymphatic/Immunological: No cervical lymphadenopathy. Cardiovascular: Normal rate, regular rhythm. Normal distal pulses. Respiratory: Normal respiratory effort. No wheezes/rales/rhonchi. Skin:  Skin is warm, dry and intact. No rash noted. ____________________________________________  PROCEDURES  Pen VK 500 mg PO ____________________________________________  INITIAL IMPRESSION / ASSESSMENT AND PLAN / ED COURSE  Patient with acute dental pain due to caries and local abscess. She is  discharged with a prescription for Pen VK. She will follow-up with one of the local dental clinic for definitive management. Return precautions are reviewed.  ____________________________________________  FINAL CLINICAL IMPRESSION(S) / ED DIAGNOSES  Final diagnoses:  Dental caries  Dental abscess     Lissa Hoard, PA-C 01/16/17 1114    Myrna Blazer, MD 01/16/17 [phone]

## 2017-12-09 ENCOUNTER — Emergency Department
Admission: EM | Admit: 2017-12-09 | Discharge: 2017-12-09 | Disposition: A | Discharge status: Home / Self Care | Payer: Self-pay | Attending: Emergency Medicine | Admitting: Emergency Medicine

## 2017-12-09 ENCOUNTER — Other Ambulatory Visit: Payer: Self-pay

## 2017-12-09 DIAGNOSIS — R55 Syncope and collapse: Secondary | ICD-10-CM | POA: Insufficient documentation

## 2017-12-09 DIAGNOSIS — F17210 Nicotine dependence, cigarettes, uncomplicated: Secondary | ICD-10-CM | POA: Insufficient documentation

## 2017-12-09 DIAGNOSIS — J45909 Unspecified asthma, uncomplicated: Secondary | ICD-10-CM | POA: Insufficient documentation

## 2017-12-09 DIAGNOSIS — N390 Urinary tract infection, site not specified: Secondary | ICD-10-CM | POA: Insufficient documentation

## 2017-12-09 HISTORY — DX: Unspecified asthma, uncomplicated: J45.909

## 2017-12-09 LAB — CBC
HEMATOCRIT: 42 % (ref 35.0–47.0)
HEMOGLOBIN: 13.9 g/dL (ref 12.0–16.0)
MCH: 28.1 pg (ref 26.0–34.0)
MCHC: 33.1 g/dL (ref 32.0–36.0)
MCV: 85.2 fL (ref 80.0–100.0)
Platelets: 280 10*3/uL (ref 150–440)
RBC: 4.94 MIL/uL (ref 3.80–5.20)
RDW: 14.2 % (ref 11.5–14.5)
WBC: 8.9 10*3/uL (ref 3.6–11.0)

## 2017-12-09 LAB — BASIC METABOLIC PANEL
ANION GAP: 7 (ref 5–15)
BUN: 8 mg/dL (ref 6–20)
CO2: 21 mmol/L — AB (ref 22–32)
Calcium: 8.7 mg/dL — ABNORMAL LOW (ref 8.9–10.3)
Chloride: 108 mmol/L (ref 101–111)
Creatinine, Ser: 0.54 mg/dL (ref 0.44–1.00)
GFR calc Af Amer: >60 mL/min (ref >60)
GFR calc non Af Amer: >60 mL/min (ref >60)
GLUCOSE: 93 mg/dL (ref 65–99)
POTASSIUM: 3.8 mmol/L (ref 3.5–5.1)
Sodium: 136 mmol/L (ref 135–145)

## 2017-12-09 LAB — URINALYSIS, COMPLETE (UACMP) WITH MICROSCOPIC
BACTERIA UA: NONE SEEN
Bilirubin Urine: NEGATIVE
Glucose, UA: NEGATIVE mg/dL
Hgb urine dipstick: NEGATIVE
Ketones, ur: NEGATIVE mg/dL
Nitrite: NEGATIVE
PH: 5 (ref 5.0–8.0)
Protein, ur: NEGATIVE mg/dL
RBC / HPF: NONE SEEN RBC/hpf (ref 0–5)
SPECIFIC GRAVITY, URINE: 1.017 (ref 1.005–1.030)

## 2017-12-09 LAB — POCT PREGNANCY, URINE: PREG TEST UR: NEGATIVE

## 2017-12-09 MED ORDER — CEPHALEXIN 500 MG PO CAPS: 500.0000 mg | ORAL_CAPSULE | Freq: Three times a day (TID) | ORAL | 0 refills | Status: DC

## 2017-12-09 NOTE — ED Notes (Signed)
Urine preg NEGATIVE. 

## 2017-12-09 NOTE — ED Triage Notes (Signed)
Pt states she was walking and remembers the entire event. States she had a near syncopal episode where she hit head on dryer. Alert, oriented, ambulatory. No distress noted. Pt states she ate and drank this morning. Pt also c/o chronic back pain that she thinks "might be work related."

## 2017-12-09 NOTE — ED Provider Notes (Signed)
[facility] Emergency Department Provider Note  Time seen: 3:31 PM  I have reviewed the triage vital signs and the nursing notes.   HISTORY  Chief Complaint Near Syncope    HPI Samantha Villegas is a 27 y.o. female with a past medical history of asthma who presents to the emergency department after near syncopal episode.  According to the patient she states she just tilted her head backwards to get the last few drops out of her drink, began walking and then felt very lightheaded, felt herself falling to the ground but was unable to prevent it did hit her head on the close dryer.  Patient states she never completely passed out and remembers the event.  Denies any history of near syncope or syncope in the past.  Patient has a largely negative review of systems otherwise, does state mild back pain over the past week or so.  Denies any vomiting, diarrhea, chest pain or shortness of breath.  States she did have a sinus infection proximal me 3 weeks ago which she took 1 week of amoxicillin.  States she had a headache after hitting her head but that resolved the proximal 1 hour after the incident which occurred around 11:00 this morning.  Denies any symptoms at this time.   Past Medical History:  Diagnosis Date  . Asthma   . Pyelonephritis     There are no active problems to display for this patient.   Past Surgical History:  Procedure Laterality Date  . CESAREAN SECTION      Prior to Admission medications   Medication Sig Start Date End Date Taking? Authorizing Provider  fluticasone (FLONASE) 50 MCG/ACT nasal spray Place 2 sprays into both nostrils daily. 01/04/17 01/04/18  Tommi Rumps, PA-C  penicillin v potassium (VEETID) 500 MG tablet Take 1 tablet (500 mg total) by mouth 4 (four) times daily. 01/16/17   Menshew, Charlesetta Ivory, PA-C  predniSONE (DELTASONE) 10 MG tablet Take 6 tablets  today, on day 2 take 5 tablets, day 3 take 4 tablets, day 4 take 3  tablets, day 5 take  2 tablets and 1 tablet the last day 01/04/17   Bridget Hartshorn L, PA-C    No Known Allergies  History reviewed. No pertinent family history.  Social History Social History   Tobacco Use  . Smoking status: Current Every Day Smoker    Packs/day: 1.00    Types: Cigarettes  . Smokeless tobacco: Never Used  Substance Use Topics  . Alcohol use: Yes  . Drug use: No    Review of Systems Constitutional: Negative for fever.  Positive for near syncopal event. Eyes: Negative for visual changes. ENT: Negative for congestion Cardiovascular: Negative for chest pain. Respiratory: Negative for shortness of breath. Gastrointestinal: Negative for abdominal pain, vomiting and diarrhea. Genitourinary: Negative for dysuria. Musculoskeletal: Mild lower back pain Skin: Negative for rash. Neurological: Moderate headache, now resolved.  Denies focal weakness or numbness. All other ROS negative  ____________________________________________   PHYSICAL EXAM:  VITAL SIGNS: ED Triage Vitals [12/09/17 1230]  Enc Vitals Group     BP 113/87     Pulse Rate 74     Resp 18     Temp 98.5 F (36.9 C)     Temp Source Oral     SpO2 98 %     Weight 250 lb (113.4 kg)     Height 5\' 5"  (1.651 m)     Head Circumference  Peak Flow      Pain Score 7     Pain Loc      Pain Edu?      Excl. in GC?     Constitutional: Alert and oriented. Well appearing and in no distress. Eyes: Normal exam ENT   Head: Normocephalic and atraumatic.   Mouth/Throat: Mucous membranes are moist. Cardiovascular: Normal rate, regular rhythm. No murmur Respiratory: Normal respiratory effort without tachypnea nor retractions. Breath sounds are clear  Gastrointestinal: Soft and nontender. No distention. Musculoskeletal: Nontender with normal range of motion in all extremities. No lower extremity tenderness Neurologic:  Normal speech and language. No gross focal neurologic deficits.  Normal grip  strength.  No pronator drift.  Cranial nerves intact. Skin:  Skin is warm, dry and intact.  Psychiatric: Mood and affect are normal. Speech and behavior are normal.   ____________________________________________    EKG  EKG reviewed and interpreted by myself shows normal sinus rhythm at 75 bpm with a narrow QRS, normal axis, normal intervals, no concerning ST changes.  Patient does appear to have Q waves in 1 and aVL however this is unchanged from last EKG 08/23/16.  ____________________________________________   INITIAL IMPRESSION / ASSESSMENT AND PLAN / ED COURSE  Pertinent labs & imaging results that were available during my care of the patient were reviewed by me and considered in my medical decision making (see chart for details).  Patient presents emergency department after near syncopal episode and hitting her head.  Differential would include near syncope, syncope, electrolyte or metabolic abnormality, infectious etiology, pregnancy.  Overall the patient appears very well, normal physical examination.  Patient's lab work has resulted largely within normal limits besides a possible mild urinary tract infection, we will cover with antibiotics and send a urine culture.  Patient's EKG is unchanged from prior.  Overall the patient appears very well I discussed with the patient increasing oral hydration taking her antibiotic and following up with the primary care doctor.  I also provided return precautions for any further lightheadedness or syncopal events.  Patient agreeable to plan of care.  ____________________________________________   FINAL CLINICAL IMPRESSION(S) / ED DIAGNOSES  Near syncope Urinary tract infection    Minna AntisPaduchowski, Abree Romick, MD 12/09/17 1534

## 2017-12-09 NOTE — Discharge Instructions (Signed)
As we discussed please drink plenty of fluids over the next 24-48 hours and obtain plenty of rest.  Please take antibiotics as prescribed for their entire course.  Return to the emergency department for any worsening symptoms, or if you feel like you are going to pass out or pass out again.  Otherwise please follow-up with the primary care doctor in the next 2-3 days for recheck/reevaluation.

## 2017-12-10 LAB — URINE CULTURE

## 2018-03-18 ENCOUNTER — Ambulatory Visit (INDEPENDENT_AMBULATORY_CARE_PROVIDER_SITE_OTHER): Payer: Managed Care, Other (non HMO) | Admitting: Family Medicine

## 2018-03-18 VITALS — BP 103/71 | HR 69 | Temp 97.8°F | Ht 66.0 in | Wt 293.1 lb

## 2018-03-18 DIAGNOSIS — B9689 Other specified bacterial agents as the cause of diseases classified elsewhere: Secondary | ICD-10-CM | POA: Diagnosis not present

## 2018-03-18 DIAGNOSIS — H0263 Xanthelasma of right eye, unspecified eyelid: Secondary | ICD-10-CM | POA: Diagnosis not present

## 2018-03-18 DIAGNOSIS — H0266 Xanthelasma of left eye, unspecified eyelid: Secondary | ICD-10-CM | POA: Diagnosis not present

## 2018-03-18 DIAGNOSIS — N760 Acute vaginitis: Secondary | ICD-10-CM

## 2018-03-18 DIAGNOSIS — J309 Allergic rhinitis, unspecified: Secondary | ICD-10-CM

## 2018-03-18 DIAGNOSIS — Z7689 Persons encountering health services in other specified circumstances: Secondary | ICD-10-CM | POA: Diagnosis not present

## 2018-03-18 LAB — WET PREP FOR TRICH, YEAST, CLUE
Clue Cell Exam: POSITIVE — AB
Trichomonas Exam: NEGATIVE
Yeast Exam: NEGATIVE

## 2018-03-18 MED ORDER — MONTELUKAST SODIUM 5 MG PO CHEW: 10.0000 mg | CHEWABLE_TABLET | Freq: Every day | ORAL | 11 refills | Status: DC

## 2018-03-18 MED ORDER — METRONIDAZOLE 500 MG PO TABS: 500.0000 mg | ORAL_TABLET | Freq: Two times a day (BID) | ORAL | 0 refills | Status: DC

## 2018-03-18 MED ORDER — FLUTICASONE PROPIONATE 50 MCG/ACT NA SUSP: 2.0000 | Freq: Two times a day (BID) | NASAL | 11 refills | Status: DC

## 2018-03-18 NOTE — Progress Notes (Signed)
BP 103/71 (BP Location: Left Arm, Patient Position: Sitting, Cuff Size: Large)   Pulse 69   Temp 97.8 F (36.6 C) (Oral)   Ht 5\' 6"  (1.676 m)   Wt 293 lb 1.6 oz (132.9 kg)   SpO2 99%   BMI 47.31 kg/m    Subjective:    Patient ID: Samantha Villegas, female    DOB: 01/06/1991, 27 y.o.   MRN: [id number]  HPI: Samantha Villegas is a 27 y.o. female  Chief Complaint  Patient presents with  . Establish Care    Patient needs new PCP. Also wants to /d for CPE as soon as possible.  . Bumps On Eyes    Went and saw odometrist. Written Rx that states Xunthelasma LUL-Check Lipid Levels   Pt here today to establish care.   Recently got off mirena as she was having lots of vaginal changes and issues with UTIs. Switching over to sprintec, started that a little over a month ago and so far doing well. Does not some lingering odor and vaginal discharge/irritation. Feels it's consistent with bouts she's had in the past with BV. Denies dysuria, hematuria, concern for STIs, fevers, abdominal pain.   Also wanting cholesterol checked as her eye doctor recently told her she had xanthelasma deposits on her eyelids. Is due for a CPE, wanting one ASAP to get all of her levels checked. Denies Cp, SOB, claudication, known hx of hyperlipidemia.   Only known medical hx is allergies, which are flaring currently with the pollen season. Not taking anything at the moment. Rhinorrhea, eye itching and drainage, post nasal drip.   Past Medical History:  Diagnosis Date  . Asthma   . History of itching   . Pyelonephritis    Social History   Socioeconomic History  . Marital status: Single    Spouse name: Not on file  . Number of children: Not on file  . Years of education: Not on file  . Highest education level: Not on file  Occupational History  . Not on file  Social Needs  . Financial resource strain: Not on file  . Food insecurity:    Worry: Not on file    Inability: Not on file  .  Transportation needs:    Medical: Not on file    Non-medical: Not on file  Tobacco Use  . Smoking status: Current Every Day Smoker    Packs/day: 1.00    Types: Cigarettes  . Smokeless tobacco: Never Used  Substance and Sexual Activity  . Alcohol use: Yes  . Drug use: No  . Sexual activity: Not on file  Lifestyle  . Physical activity:    Days per week: Not on file    Minutes per session: Not on file  . Stress: Not on file  Relationships  . Social connections:    Talks on phone: Not on file    Gets together: Not on file    Attends religious service: Not on file    Active member of club or organization: Not on file    Attends meetings of clubs or organizations: Not on file    Relationship status: Not on file  . Intimate partner violence:    Fear of current or ex partner: Not on file    Emotionally abused: Not on file    Physically abused: Not on file    Forced sexual activity: Not on file  Other Topics Concern  . Not on file  Social History Narrative  .  Not on file    Relevant past medical, surgical, family and social history reviewed and updated as indicated. Interim medical history since our last visit reviewed. Allergies and medications reviewed and updated.  Review of Systems  Per HPI unless specifically indicated above     Objective:    BP 103/71 (BP Location: Left Arm, Patient Position: Sitting, Cuff Size: Large)   Pulse 69   Temp 97.8 F (36.6 C) (Oral)   Ht 5\' 6"  (1.676 m)   Wt 293 lb 1.6 oz (132.9 kg)   SpO2 99%   BMI 47.31 kg/m   Wt Readings from Last 3 Encounters:  03/18/18 293 lb 1.6 oz (132.9 kg)  12/09/17 250 lb (113.4 kg)  01/16/17 250 lb (113.4 kg)    Physical Exam  Constitutional: She is oriented to person, place, and time. She appears well-developed and well-nourished. No distress.  HENT:  Head: Atraumatic.  Right Ear: External ear normal.  Left Ear: External ear normal.  Oropharynx and nasal mucosa injected Rhinorrhea present  Eyes:  Pupils are equal, round, and reactive to light. Conjunctivae are normal.  Xanthelasma spots present on b/l eyelids  Neck: Normal range of motion. Neck supple.  Cardiovascular: Normal rate and regular rhythm.  Pulmonary/Chest: Effort normal and breath sounds normal.  Abdominal: Soft. Bowel sounds are normal.  Genitourinary: Vaginal discharge found.  Musculoskeletal: Normal range of motion.  Lymphadenopathy:    She has no cervical adenopathy.  Neurological: She is alert and oriented to person, place, and time.  Skin: Skin is warm and dry.  Psychiatric: She has a normal mood and affect. Her behavior is normal.  Nursing note and vitals reviewed.   Results for orders placed or performed in visit on 03/18/18  WET PREP FOR TRICH, YEAST, CLUE  Result Value Ref Range   Trichomonas Exam Negative Negative   Yeast Exam Negative Negative   Clue Cell Exam Positive (A) Negative      Assessment & Plan:   Problem List Items Addressed This Visit      Respiratory   Allergic rhinitis    Start good allergy regimen with daily singulair, flonase, and OTC antihistamine. Sinus rinses, humidifier prn.        Other Visit Diagnoses    BV (bacterial vaginosis)    -  Primary   Wet prep pos, will tx with flagyl, probiotics, good vaginal hygiene. F/u if no improvement in sxs   Relevant Medications   metroNIDAZOLE (FLAGYL) 500 MG tablet   Other Relevant Orders   WET PREP FOR TRICH, YEAST, CLUE (Completed)   Encounter to establish care       Xanthelasma of eyelid, bilateral       Will check lipids at upcoming CPE and tx depending on results. Lifestyle habits reviewed with diet and exercise, fish oil supplements       Follow up plan: Return for CPE ASAP.

## 2018-03-20 DIAGNOSIS — J309 Allergic rhinitis, unspecified: Secondary | ICD-10-CM | POA: Insufficient documentation

## 2018-03-20 NOTE — Assessment & Plan Note (Signed)
Start good allergy regimen with daily singulair, flonase, and OTC antihistamine. Sinus rinses, humidifier prn.

## 2018-03-20 NOTE — Patient Instructions (Signed)
Follow up for CPE 

## 2018-03-25 ENCOUNTER — Encounter: Payer: Managed Care, Other (non HMO) | Admitting: Family Medicine

## 2018-03-28 ENCOUNTER — Ambulatory Visit (INDEPENDENT_AMBULATORY_CARE_PROVIDER_SITE_OTHER): Payer: Managed Care, Other (non HMO) | Admitting: Family Medicine

## 2018-03-28 ENCOUNTER — Encounter: Payer: Self-pay | Admitting: Family Medicine

## 2018-03-28 VITALS — BP 110/72 | HR 82 | Temp 98.7°F | Ht 65.5 in | Wt 293.0 lb

## 2018-03-28 DIAGNOSIS — J309 Allergic rhinitis, unspecified: Secondary | ICD-10-CM

## 2018-03-28 DIAGNOSIS — N941 Unspecified dyspareunia: Secondary | ICD-10-CM | POA: Diagnosis not present

## 2018-03-28 DIAGNOSIS — Z113 Encounter for screening for infections with a predominantly sexual mode of transmission: Secondary | ICD-10-CM | POA: Diagnosis not present

## 2018-03-28 DIAGNOSIS — R51 Headache: Secondary | ICD-10-CM | POA: Diagnosis not present

## 2018-03-28 DIAGNOSIS — R519 Headache, unspecified: Secondary | ICD-10-CM

## 2018-03-28 DIAGNOSIS — Z124 Encounter for screening for malignant neoplasm of cervix: Secondary | ICD-10-CM | POA: Diagnosis not present

## 2018-03-28 DIAGNOSIS — N898 Other specified noninflammatory disorders of vagina: Secondary | ICD-10-CM | POA: Diagnosis not present

## 2018-03-28 DIAGNOSIS — L0293 Carbuncle, unspecified: Secondary | ICD-10-CM | POA: Diagnosis not present

## 2018-03-28 DIAGNOSIS — Z0000 Encounter for general adult medical examination without abnormal findings: Secondary | ICD-10-CM

## 2018-03-28 DIAGNOSIS — Z0001 Encounter for general adult medical examination with abnormal findings: Secondary | ICD-10-CM | POA: Diagnosis not present

## 2018-03-28 LAB — UA/M W/RFLX CULTURE, ROUTINE
BILIRUBIN UA: NEGATIVE
Glucose, UA: NEGATIVE
Ketones, UA: NEGATIVE
Leukocytes, UA: NEGATIVE
NITRITE UA: NEGATIVE
PH UA: 6 (ref 5.0–7.5)
Protein, UA: NEGATIVE
RBC UA: NEGATIVE
Specific Gravity, UA: 1.02 (ref 1.005–1.030)
UUROB: 0.2 mg/dL (ref 0.2–1.0)

## 2018-03-28 LAB — WET PREP FOR TRICH, YEAST, CLUE
Clue Cell Exam: NEGATIVE
TRICHOMONAS EXAM: NEGATIVE
YEAST EXAM: NEGATIVE

## 2018-03-28 MED ORDER — CHLORHEXIDINE GLUCONATE 4 % EX LIQD: Freq: Every day | CUTANEOUS | 0 refills | Status: DC | PRN

## 2018-03-28 NOTE — Progress Notes (Signed)
BP 110/72 (BP Location: Right Arm, Patient Position: Sitting, Cuff Size: Normal)   Pulse 82   Temp 98.7 F (37.1 C) (Oral)   Ht 5' 5.5" (1.664 m)   Wt 293 lb (132.9 kg)   SpO2 100%   BMI 48.02 kg/m    Subjective:    Patient ID: Samantha Villegas, female    DOB: 01-Apr-1991, 27 y.o.   MRN: [id number]  HPI: Samantha Villegas is a 26 y.o. female presenting on 03/28/2018 for comprehensive medical examination. 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 Making her nauseated at times. Taking OTC pain relievers with no relief. Takes hours for it to go away.   Dysparuenia and lower abdominal pressure x 6 months. Has now been treated for UTI and BV with no improvement.   Also having frequent boils under breasts. No current infections. Not currently using anything OTC.   Depression Screen done today and results listed below:  Depression screen [facility] 2/9 03/28/2018  Decreased Interest 2  Down, Depressed, Hopeless 2  PHQ - 2 Score 4  Altered sleeping 2  Tired, decreased energy 2  Change in appetite 1  Feeling bad or failure about yourself  0  Trouble concentrating 0  Moving slowly or fidgety/restless 0  Suicidal thoughts 0  PHQ-9 Score 9    The patient does not have a history of falls. I did not complete a risk assessment for falls. A plan of care for falls was not documented.   Past Medical History:  Past Medical History:  Diagnosis Date  . Asthma   . History of itching   . Pyelonephritis     Surgical History:  Past Surgical History:  Procedure Laterality Date  . CESAREAN SECTION      Medications:  Current Outpatient Medications on File Prior to Visit  Medication Sig  . fluticasone (FLONASE) 50 MCG/ACT nasal spray Place 2 sprays into both nostrils 2 (two) times daily.  . montelukast (SINGULAIR) 5 MG chewable tablet Chew 2 tablets (10  mg total) by mouth at bedtime.  . SPRINTEC 28 0.25-35 MG-MCG tablet    No current facility-administered medications on file prior to visit.     Allergies:  No Known Allergies  Social History:  Social History   Socioeconomic History  . Marital status: Single    Spouse name: Not on file  . Number of children: Not on file  . Years of education: Not on file  . Highest education level: Not on file  Occupational History  . Not on file  Social Needs  . Financial resource strain: Not on file  . Food insecurity:    Worry: Not on file    Inability: Not on file  . Transportation needs:    Medical: Not on file    Non-medical: Not on file  Tobacco Use  . Smoking status: Current Every Day Smoker    Packs/day: 1.00    Types: Cigarettes  . Smokeless tobacco: Never Used  Substance and Sexual Activity  . Alcohol use: Yes  . Drug use: No  . Sexual activity: Not on file  Lifestyle  . Physical activity:    Days per week: Not on file    Minutes per session: Not on file  . Stress: Not on file  Relationships  . Social connections:    Talks on phone: Not on file  Gets together: Not on file    Attends religious service: Not on file    Active member of club or organization: Not on file    Attends meetings of clubs or organizations: Not on file    Relationship status: Not on file  . Intimate partner violence:    Fear of current or ex partner: Not on file    Emotionally abused: Not on file    Physically abused: Not on file    Forced sexual activity: Not on file  Other Topics Concern  . Not on file  Social History Narrative  . Not on file   Social History   Tobacco Use  Smoking Status Current Every Day Smoker  . Packs/day: 1.00  . Types: Cigarettes  Smokeless Tobacco Never Used   Social History   Substance and Sexual Activity  Alcohol Use Yes    Family History:  Family History  Problem Relation Age of Onset  . Arthritis Mother   . Asthma Mother   . Hyperlipidemia  Mother   . Hypertension Mother   . Stroke Mother   . Ulcers Mother   . Drug abuse Brother   . Early death Brother   . Cancer Maternal Grandmother   . Heart disease Paternal Grandmother   . Diabetes Paternal Aunt     Past medical history, surgical history, medications, allergies, family history and social history reviewed with patient today and changes made to appropriate areas of the chart.   Review of Systems - General ROS: negative Psychological ROS: negative Ophthalmic ROS: negative ENT ROS: negative Allergy and Immunology ROS: negative Breast ROS: negative for breast lumps Respiratory ROS: no cough, shortness of breath, or wheezing Cardiovascular ROS: no chest pain or dyspnea on exertion Gastrointestinal ROS: no abdominal pain, change in bowel habits, or black or bloody stools Genito-Urinary ROS: positive for - dyspareunia Musculoskeletal ROS: negative Neurological ROS: positive for - headaches and visual changes Dermatological ROS: positive for boils All other ROS negative except what is listed above and in the HPI.      Objective:    BP 110/72 (BP Location: Right Arm, Patient Position: Sitting, Cuff Size: Normal)   Pulse 82   Temp 98.7 F (37.1 C) (Oral)   Ht 5' 5.5" (1.664 m)   Wt 293 lb (132.9 kg)   SpO2 100%   BMI 48.02 kg/m   Wt Readings from Last 3 Encounters:  03/28/18 293 lb (132.9 kg)  03/18/18 293 lb 1.6 oz (132.9 kg)  12/09/17 250 lb (113.4 kg)    Physical Exam  Constitutional: She is oriented to person, place, and time. She appears well-developed and well-nourished. No distress.  HENT:  Head: Atraumatic.  Right Ear: External ear normal.  Left Ear: External ear normal.  Nose: Nose normal.  Mouth/Throat: Oropharynx is clear and moist. No oropharyngeal exudate.  Eyes: Pupils are equal, round, and reactive to light. Conjunctivae are normal. No scleral icterus.  Neck: Normal range of motion. Neck supple. No thyromegaly present.  Cardiovascular:  Normal rate, regular rhythm, normal heart sounds and intact distal pulses.  Pulmonary/Chest: Effort normal and breath sounds normal. No respiratory distress.  Abdominal: Soft. Bowel sounds are normal. She exhibits no mass. There is no tenderness.  Genitourinary: Vaginal discharge (minimal) found.  Genitourinary Comments: Vaginal mucosa mildly erythematous  Musculoskeletal: Normal range of motion. She exhibits no edema or tenderness.  Lymphadenopathy:    She has no cervical adenopathy.  Neurological: She is alert and oriented to person, place, and time.  No cranial nerve deficit.  Skin: Skin is warm and dry. No rash noted.  Psychiatric: She has a normal mood and affect. Her behavior is normal.  Nursing note and vitals reviewed.   Results for orders placed or performed in visit on 03/28/18  WET PREP FOR TRICH, YEAST, CLUE  Result Value Ref Range   Trichomonas Exam Negative Negative   Yeast Exam Negative Negative   Clue Cell Exam Negative Negative  GC/Chlamydia Probe Amp  Result Value Ref Range   Chlamydia trachomatis, NAA Negative Negative   Neisseria gonorrhoeae by PCR Negative Negative  CBC with Differential/Platelet  Result Value Ref Range   WBC 8.4 3.4 - 10.8 x10E3/uL   RBC 4.66 3.77 - 5.28 x10E6/uL   Hemoglobin 13.4 11.1 - 15.9 g/dL   Hematocrit 13.0 86.5 - 46.6 %   MCV 85 79 - 97 fL   MCH 28.8 26.6 - 33.0 pg   MCHC 33.8 31.5 - 35.7 g/dL   RDW 78.4 69.6 - 29.5 %   Platelets 283 150 - 379 x10E3/uL   Neutrophils 58 Not Estab. %   Lymphs 33 Not Estab. %   Monocytes 4 Not Estab. %   Eos 4 Not Estab. %   Basos 1 Not Estab. %   Neutrophils Absolute 4.8 1.4 - 7.0 x10E3/uL   Lymphocytes Absolute 2.8 0.7 - 3.1 x10E3/uL   Monocytes Absolute 0.3 0.1 - 0.9 x10E3/uL   EOS (ABSOLUTE) 0.3 0.0 - 0.4 x10E3/uL   Basophils Absolute 0.0 0.0 - 0.2 x10E3/uL   Immature Granulocytes 0 Not Estab. %   Immature Grans (Abs) 0.0 0.0 - 0.1 x10E3/uL  Comprehensive metabolic panel  Result Value Ref  Range   Glucose 91 65 - 99 mg/dL   BUN 9 6 - 20 mg/dL   Creatinine, Ser 2.84 0.57 - 1.00 mg/dL   GFR calc non Af Amer 119 >59 mL/min/1.73   GFR calc Af Amer 138 >59 mL/min/1.73   BUN/Creatinine Ratio 13 9 - 23   Sodium 142 134 - 144 mmol/L   Potassium 4.1 3.5 - 5.2 mmol/L   Chloride 106 96 - 106 mmol/L   CO2 21 20 - 29 mmol/L   Calcium 8.9 8.7 - 10.2 mg/dL   Total Protein 6.8 6.0 - 8.5 g/dL   Albumin 4.0 3.5 - 5.5 g/dL   Globulin, Total 2.8 1.5 - 4.5 g/dL   Albumin/Globulin Ratio 1.4 1.2 - 2.2   Bilirubin Total <0.2 0.0 - 1.2 mg/dL   Alkaline Phosphatase 54 39 - 117 IU/L   AST 13 0 - 40 IU/L   ALT 16 0 - 32 IU/L  Lipid Panel w/o Chol/HDL Ratio  Result Value Ref Range   Cholesterol, Total 161 100 - 199 mg/dL   Triglycerides 132 0 - 149 mg/dL   HDL 52 >44 mg/dL   VLDL Cholesterol Cal 24 5 - 40 mg/dL   LDL Calculated 85 0 - 99 mg/dL  TSH  Result Value Ref Range   TSH 1.360 0.450 - 4.500 uIU/mL  UA/M w/rflx Culture, Routine  Result Value Ref Range   Specific Gravity, UA 1.020 1.005 - 1.030   pH, UA 6.0 5.0 - 7.5   Color, UA Yellow Yellow   Appearance Ur Hazy (A) Clear   Leukocytes, UA Negative Negative   Protein, UA Negative Negative/Trace   Glucose, UA Negative Negative   Ketones, UA Negative Negative   RBC, UA Negative Negative   Bilirubin, UA Negative Negative   Urobilinogen, Ur 0.2 0.2 -  1.0 mg/dL   Nitrite, UA Negative Negative  HIV antibody  Result Value Ref Range   HIV Screen 4th Generation wRfx Non Reactive Non Reactive  RPR  Result Value Ref Range   RPR Ser Ql Non Reactive Non Reactive  HSV(herpes simplex vrs) 1+2 ab-IgG  Result Value Ref Range   HSV 1 Glycoprotein G Ab, IgG <0.91 0.00 - 0.90 index   HSV 2 IgG, Type Spec <0.91 0.00 - 0.90 index      Assessment & Plan:   Problem List Items Addressed This Visit      Respiratory   Allergic rhinitis    Just started on singulair and flonase, doing well so far on this regimen. Continue these medications,  humidifier and sinus rinses prn.        Other Visit Diagnoses    Recurrent boils    -  Primary   Hibiclens wash several times weekly for prevention, powders to keep area dry, and neosporin and warm compresses at first signs of a boil   Annual physical exam       Relevant Orders   CBC with Differential/Platelet (Completed)   Comprehensive metabolic panel (Completed)   Lipid Panel w/o Chol/HDL Ratio (Completed)   TSH (Completed)   UA/M w/rflx Culture, Routine (Completed)   Screening for cervical cancer       Relevant Orders   Pap Lb, rfx HPV ASCU   Vaginal discharge       BV appears to be resolved, and wet prep otherwise neg for yeast and trich. Probiotics, hyaluronic acid prn for hydration of vaginal tissue   Relevant Orders   WET PREP FOR TRICH, YEAST, CLUE (Completed)   Routine screening for STI (sexually transmitted infection)       Relevant Orders   HIV antibody (Completed)   RPR (Completed)   HSV(herpes simplex vrs) 1+2 ab-IgG (Completed)   GC/Chlamydia Probe Amp (Completed)   Dyspareunia, female       Will r/o current UTI or vaginal infections. Pt not wanting any imaging or referrals at this time for issue, just infection r/o. STI testing ordered as well   Nonintractable episodic headache, unspecified headache type       Pt feels it's related to staring at computer screen, awaiting glasses from eye doctor currently. Will try this first before trying any medications for her HAs       Follow up plan: Return in about 1 year (around 03/29/2019) for CPE.   LABORATORY TESTING:  - Pap smear: pap done  IMMUNIZATIONS:   - Tdap: Tetanus vaccination status reviewed: last tetanus booster within 10 years. - Influenza: Postponed to flu season  PATIENT COUNSELING:   Advised to take 1 mg of folate supplement per day if capable of pregnancy.   Sexuality: Discussed sexually transmitted diseases, partner selection, use of condoms, avoidance of unintended pregnancy  and contraceptive  alternatives.   Advised to avoid cigarette smoking.  I discussed with the patient that most people either abstain from alcohol or drink within safe limits (<=14/week and <=4 drinks/occasion for males, <=7/weeks and <= 3 drinks/occasion for females) and that the risk for alcohol disorders and other health effects rises proportionally with the number of drinks per week and how often a drinker exceeds daily limits.  Discussed cessation/primary prevention of drug use and availability of treatment for abuse.   Diet: Encouraged to adjust caloric intake to maintain  or achieve ideal body weight, to reduce intake of dietary saturated fat and total fat, to  limit sodium intake by avoiding high sodium foods and not adding table salt, and to maintain adequate dietary potassium and calcium preferably from fresh fruits, vegetables, and low-fat dairy products.    stressed the importance of regular exercise  Injury prevention: Discussed safety belts, safety helmets, smoke detector, smoking near bedding or upholstery.   Dental health: Discussed importance of regular tooth brushing, flossing, and dental visits.    NEXT PREVENTATIVE PHYSICAL DUE IN 1 YEAR. Return in about 1 year (around 03/29/2019) for CPE.

## 2018-03-29 LAB — CBC WITH DIFFERENTIAL/PLATELET
BASOS ABS: 0 10*3/uL (ref 0.0–0.2)
Basos: 1 %
EOS (ABSOLUTE): 0.3 10*3/uL (ref 0.0–0.4)
EOS: 4 %
HEMOGLOBIN: 13.4 g/dL (ref 11.1–15.9)
Hematocrit: 39.6 % (ref 34.0–46.6)
IMMATURE GRANULOCYTES: 0 %
Immature Grans (Abs): 0 10*3/uL (ref 0.0–0.1)
LYMPHS ABS: 2.8 10*3/uL (ref 0.7–3.1)
LYMPHS: 33 %
MCH: 28.8 pg (ref 26.6–33.0)
MCHC: 33.8 g/dL (ref 31.5–35.7)
MCV: 85 fL (ref 79–97)
MONOCYTES: 4 %
Monocytes Absolute: 0.3 10*3/uL (ref 0.1–0.9)
NEUTROS PCT: 58 %
Neutrophils Absolute: 4.8 10*3/uL (ref 1.4–7.0)
Platelets: 283 10*3/uL (ref 150–379)
RBC: 4.66 x10E6/uL (ref 3.77–5.28)
RDW: 14 % (ref 12.3–15.4)
WBC: 8.4 10*3/uL (ref 3.4–10.8)

## 2018-03-29 LAB — TSH: TSH: 1.36 u[IU]/mL (ref 0.450–4.500)

## 2018-03-29 LAB — RPR: RPR: NONREACTIVE

## 2018-03-29 LAB — COMPREHENSIVE METABOLIC PANEL
ALBUMIN: 4 g/dL (ref 3.5–5.5)
ALK PHOS: 54 IU/L (ref 39–117)
ALT: 16 IU/L (ref 0–32)
AST: 13 IU/L (ref 0–40)
Albumin/Globulin Ratio: 1.4 (ref 1.2–2.2)
BUN / CREAT RATIO: 13 (ref 9–23)
BUN: 9 mg/dL (ref 6–20)
Bilirubin Total: <0.2 mg/dL (ref 0.0–1.2)
CALCIUM: 8.9 mg/dL (ref 8.7–10.2)
CO2: 21 mmol/L (ref 20–29)
CREATININE: 0.68 mg/dL (ref 0.57–1.00)
Chloride: 106 mmol/L (ref 96–106)
GFR calc Af Amer: 138 mL/min/{1.73_m2} (ref >59)
GFR, EST NON AFRICAN AMERICAN: 119 mL/min/{1.73_m2} (ref >59)
GLUCOSE: 91 mg/dL (ref 65–99)
Globulin, Total: 2.8 g/dL (ref 1.5–4.5)
Potassium: 4.1 mmol/L (ref 3.5–5.2)
Sodium: 142 mmol/L (ref 134–144)
Total Protein: 6.8 g/dL (ref 6.0–8.5)

## 2018-03-29 LAB — LIPID PANEL W/O CHOL/HDL RATIO
CHOLESTEROL TOTAL: 161 mg/dL (ref 100–199)
HDL: 52 mg/dL (ref >39)
LDL Calculated: 85 mg/dL (ref 0–99)
TRIGLYCERIDES: 120 mg/dL (ref 0–149)
VLDL CHOLESTEROL CAL: 24 mg/dL (ref 5–40)

## 2018-03-29 LAB — HSV(HERPES SIMPLEX VRS) I + II AB-IGG: HSV 1 Glycoprotein G Ab, IgG: <0.91 index (ref 0.00–0.90)

## 2018-03-29 LAB — GC/CHLAMYDIA PROBE AMP
CHLAMYDIA, DNA PROBE: NEGATIVE
NEISSERIA GONORRHOEAE BY PCR: NEGATIVE

## 2018-03-29 LAB — HIV ANTIBODY (ROUTINE TESTING W REFLEX): HIV SCREEN 4TH GENERATION: NONREACTIVE

## 2018-03-31 ENCOUNTER — Telehealth: Payer: Self-pay | Admitting: Family Medicine

## 2018-03-31 ENCOUNTER — Telehealth: Payer: Self-pay

## 2018-03-31 NOTE — Telephone Encounter (Signed)
Called yesterday to give patient her results. Will try again today.

## 2018-03-31 NOTE — Telephone Encounter (Signed)
Routed results to RL to release through MyChart for patient.

## 2018-03-31 NOTE — Telephone Encounter (Signed)
Copied from CRM [id number]. Topic: Inquiry >> Mar 30, 2018  6:03 PM Alexander BergeronBarksdale, Harvey B wrote: Reason for CRM: pt called stating that a missed phone call from the practice was made and she was returning the call

## 2018-03-31 NOTE — Telephone Encounter (Signed)
See other telephone encounter for 03/31/2018

## 2018-03-31 NOTE — Assessment & Plan Note (Signed)
Just started on singulair and flonase, doing well so far on this regimen. Continue these medications, humidifier and sinus rinses prn.

## 2018-03-31 NOTE — Telephone Encounter (Signed)
Patient called to get lab results. She said she missed a call yesterday. She needs to be called before 3 if at all possible.  Thank you

## 2018-03-31 NOTE — Telephone Encounter (Signed)
Pt. Is calling again, has to go to work and can not answer her phone.  Please call before 4:15 today or can put on my chart

## 2018-03-31 NOTE — Patient Instructions (Signed)
Follow up for CPE 

## 2018-04-01 ENCOUNTER — Encounter: Payer: Self-pay | Admitting: Family Medicine

## 2018-04-01 LAB — PAP LB, RFX HPV ASCU: PAP SMEAR COMMENT: 0

## 2018-04-05 ENCOUNTER — Other Ambulatory Visit: Payer: Self-pay | Admitting: Family Medicine

## 2018-04-05 DIAGNOSIS — N941 Unspecified dyspareunia: Secondary | ICD-10-CM

## 2018-04-07 ENCOUNTER — Telehealth: Payer: Self-pay | Admitting: Obstetrics & Gynecology

## 2018-04-07 NOTE — Telephone Encounter (Signed)
[facility] referring Dyspareunia. Called and left voicemail for patient to call back.Marland KitchenMarland Kitchen

## 2018-04-15 ENCOUNTER — Encounter: Payer: Self-pay | Admitting: Obstetrics and Gynecology

## 2018-04-25 ENCOUNTER — Encounter: Payer: Self-pay | Admitting: Obstetrics and Gynecology

## 2018-05-05 ENCOUNTER — Encounter: Payer: Self-pay | Admitting: Unknown Physician Specialty

## 2018-05-05 ENCOUNTER — Ambulatory Visit (INDEPENDENT_AMBULATORY_CARE_PROVIDER_SITE_OTHER): Payer: Managed Care, Other (non HMO) | Admitting: Unknown Physician Specialty

## 2018-05-05 VITALS — BP 103/72 | HR 89 | Temp 98.5°F | Wt 289.6 lb

## 2018-05-05 DIAGNOSIS — R197 Diarrhea, unspecified: Secondary | ICD-10-CM | POA: Diagnosis not present

## 2018-05-05 MED ORDER — CIPROFLOXACIN HCL 500 MG PO TABS: 500.0000 mg | ORAL_TABLET | Freq: Two times a day (BID) | ORAL | 0 refills | Status: DC

## 2018-05-05 MED ORDER — METRONIDAZOLE 500 MG PO TABS: 500.0000 mg | ORAL_TABLET | Freq: Two times a day (BID) | ORAL | 0 refills | Status: DC

## 2018-05-05 NOTE — Progress Notes (Signed)
BP 103/72 (BP Location: Right Wrist, Patient Position: Sitting, Cuff Size: Normal)   Pulse 89   Temp 98.5 F (36.9 C) (Oral)   Wt 289 lb 9.6 oz (131.4 kg)   SpO2 97%   BMI 47.46 kg/m    Subjective:    Patient ID: [name], female    DOB: Jul 21, 1991, 27 y.o.   MRN: [id number]  HPI: Daysie Helf is a 27 y.o. female  Chief Complaint  Patient presents with  . Diarrhea    Patient states she's had diarrhea ongoing for 10 days.   . Hemorrhoids    States because of diarrhea, her hemorrhoids are painful.    Pt with 10 days of diarrhea.  States this started after eating [place] food.  Having multiple episodes of watery stool/fay.  Pt states she woke up 3 times last night with diarrhea.   No fever.  No nausea or vomiting.  No abdominal pain besides gas. Would prefer I don't look at her hemorrhoids.   No recent antibiotics.  No new medications besides Sprintec.    Relevant past medical, surgical, family and social history reviewed and updated as indicated. Interim medical history since our last visit reviewed. Allergies and medications reviewed and updated.  Review of Systems  Per HPI unless specifically indicated above     Objective:    BP 103/72 (BP Location: Right Wrist, Patient Position: Sitting, Cuff Size: Normal)   Pulse 89   Temp 98.5 F (36.9 C) (Oral)   Wt 289 lb 9.6 oz (131.4 kg)   SpO2 97%   BMI 47.46 kg/m   Wt Readings from Last 3 Encounters:  05/05/18 289 lb 9.6 oz (131.4 kg)  03/28/18 293 lb (132.9 kg)  03/18/18 293 lb 1.6 oz (132.9 kg)    Physical Exam  Constitutional: She is oriented to person, place, and time. She appears well-developed and well-nourished. No distress.  HENT:  Head: Normocephalic and atraumatic.  Eyes: Conjunctivae and lids are normal. Right eye exhibits no discharge. Left eye exhibits no discharge. No scleral icterus.  Neck: Normal range of motion. Neck supple. No JVD present. Carotid bruit is not present.    Cardiovascular: Normal rate, regular rhythm and normal heart sounds.  Pulmonary/Chest: Effort normal and breath sounds normal.  Abdominal: Soft. Normal appearance. She exhibits no distension. There is no splenomegaly or hepatomegaly. There is no tenderness. There is no guarding.  Musculoskeletal: Normal range of motion.  Neurological: She is alert and oriented to person, place, and time.  Skin: Skin is warm, dry and intact. No rash noted. No pallor.  Psychiatric: She has a normal mood and affect. Her behavior is normal. Judgment and thought content normal.    Results for orders placed or performed in visit on 03/28/18  WET PREP FOR TRICH, YEAST, CLUE  Result Value Ref Range   Trichomonas Exam Negative Negative   Yeast Exam Negative Negative   Clue Cell Exam Negative Negative  GC/Chlamydia Probe Amp  Result Value Ref Range   Chlamydia trachomatis, NAA Negative Negative   Neisseria gonorrhoeae by PCR Negative Negative  CBC with Differential/Platelet  Result Value Ref Range   WBC 8.4 3.4 - 10.8 x10E3/uL   RBC 4.66 3.77 - 5.28 x10E6/uL   Hemoglobin 13.4 11.1 - 15.9 g/dL   Hematocrit 40.9 81.1 - 46.6 %   MCV 85 79 - 97 fL   MCH 28.8 26.6 - 33.0 pg   MCHC 33.8 31.5 - 35.7 g/dL   RDW 91.4 78.2 -  15.4 %   Platelets 283 150 - 379 x10E3/uL   Neutrophils 58 Not Estab. %   Lymphs 33 Not Estab. %   Monocytes 4 Not Estab. %   Eos 4 Not Estab. %   Basos 1 Not Estab. %   Neutrophils Absolute 4.8 1.4 - 7.0 x10E3/uL   Lymphocytes Absolute 2.8 0.7 - 3.1 x10E3/uL   Monocytes Absolute 0.3 0.1 - 0.9 x10E3/uL   EOS (ABSOLUTE) 0.3 0.0 - 0.4 x10E3/uL   Basophils Absolute 0.0 0.0 - 0.2 x10E3/uL   Immature Granulocytes 0 Not Estab. %   Immature Grans (Abs) 0.0 0.0 - 0.1 x10E3/uL  Comprehensive metabolic panel  Result Value Ref Range   Glucose 91 65 - 99 mg/dL   BUN 9 6 - 20 mg/dL   Creatinine, Ser 1.61 0.57 - 1.00 mg/dL   GFR calc non Af Amer 119 >59 mL/min/1.73   GFR calc Af Amer 138 >59  mL/min/1.73   BUN/Creatinine Ratio 13 9 - 23   Sodium 142 134 - 144 mmol/L   Potassium 4.1 3.5 - 5.2 mmol/L   Chloride 106 96 - 106 mmol/L   CO2 21 20 - 29 mmol/L   Calcium 8.9 8.7 - 10.2 mg/dL   Total Protein 6.8 6.0 - 8.5 g/dL   Albumin 4.0 3.5 - 5.5 g/dL   Globulin, Total 2.8 1.5 - 4.5 g/dL   Albumin/Globulin Ratio 1.4 1.2 - 2.2   Bilirubin Total <0.2 0.0 - 1.2 mg/dL   Alkaline Phosphatase 54 39 - 117 IU/L   AST 13 0 - 40 IU/L   ALT 16 0 - 32 IU/L  Lipid Panel w/o Chol/HDL Ratio  Result Value Ref Range   Cholesterol, Total 161 100 - 199 mg/dL   Triglycerides 096 0 - 149 mg/dL   HDL 52 >04 mg/dL   VLDL Cholesterol Cal 24 5 - 40 mg/dL   LDL Calculated 85 0 - 99 mg/dL  TSH  Result Value Ref Range   TSH 1.360 0.450 - 4.500 uIU/mL  UA/M w/rflx Culture, Routine  Result Value Ref Range   Specific Gravity, UA 1.020 1.005 - 1.030   pH, UA 6.0 5.0 - 7.5   Color, UA Yellow Yellow   Appearance Ur Hazy (A) Clear   Leukocytes, UA Negative Negative   Protein, UA Negative Negative/Trace   Glucose, UA Negative Negative   Ketones, UA Negative Negative   RBC, UA Negative Negative   Bilirubin, UA Negative Negative   Urobilinogen, Ur 0.2 0.2 - 1.0 mg/dL   Nitrite, UA Negative Negative  HIV antibody  Result Value Ref Range   HIV Screen 4th Generation wRfx Non Reactive Non Reactive  RPR  Result Value Ref Range   RPR Ser Ql Non Reactive Non Reactive  HSV(herpes simplex vrs) 1+2 ab-IgG  Result Value Ref Range   HSV 1 Glycoprotein G Ab, IgG <0.91 0.00 - 0.90 index   HSV 2 IgG, Type Spec <0.91 0.00 - 0.90 index  Pap Lb, rfx HPV ASCU  Result Value Ref Range   DIAGNOSIS: Comment    Specimen adequacy: Comment    Clinician Provided ICD10 Comment    Performed by: Comment    QC reviewed by: Comment    PAP Smear Comment .    Note: Comment    PAP Reflex Comment       Assessment & Plan:   Problem List Items Addressed This Visit    None    Visit Diagnoses    Diarrhea of presumed  infectious origin    -  Primary   Presumed infectious due to night time episodes.  Rx given for Cipro 500 mg BID and Metronidazole 500 mg BID for 7 days.         Follow up plan: Return if symptoms worsen or fail to improve.

## 2018-05-08 ENCOUNTER — Emergency Department
Admission: EM | Admit: 2018-05-08 | Discharge: 2018-05-08 | Disposition: A | Discharge status: Home / Self Care | Payer: Managed Care, Other (non HMO) | Attending: Emergency Medicine | Admitting: Emergency Medicine

## 2018-05-08 ENCOUNTER — Emergency Department: Payer: Managed Care, Other (non HMO)

## 2018-05-08 ENCOUNTER — Other Ambulatory Visit: Payer: Self-pay

## 2018-05-08 DIAGNOSIS — Z79899 Other long term (current) drug therapy: Secondary | ICD-10-CM | POA: Insufficient documentation

## 2018-05-08 DIAGNOSIS — R300 Dysuria: Secondary | ICD-10-CM | POA: Diagnosis not present

## 2018-05-08 DIAGNOSIS — R109 Unspecified abdominal pain: Secondary | ICD-10-CM | POA: Insufficient documentation

## 2018-05-08 DIAGNOSIS — N390 Urinary tract infection, site not specified: Secondary | ICD-10-CM | POA: Insufficient documentation

## 2018-05-08 DIAGNOSIS — R197 Diarrhea, unspecified: Secondary | ICD-10-CM

## 2018-05-08 DIAGNOSIS — J45909 Unspecified asthma, uncomplicated: Secondary | ICD-10-CM | POA: Insufficient documentation

## 2018-05-08 DIAGNOSIS — F17210 Nicotine dependence, cigarettes, uncomplicated: Secondary | ICD-10-CM | POA: Insufficient documentation

## 2018-05-08 LAB — URINALYSIS, COMPLETE (UACMP) WITH MICROSCOPIC
BACTERIA UA: NONE SEEN
BILIRUBIN URINE: NEGATIVE
Glucose, UA: NEGATIVE mg/dL
KETONES UR: NEGATIVE mg/dL
NITRITE: NEGATIVE
Protein, ur: 30 mg/dL — AB
RBC / HPF: >50 RBC/hpf — ABNORMAL HIGH (ref 0–5)
Specific Gravity, Urine: 1.029 (ref 1.005–1.030)
WBC, UA: >50 WBC/hpf — ABNORMAL HIGH (ref 0–5)
pH: 6 (ref 5.0–8.0)

## 2018-05-08 LAB — GASTROINTESTINAL PANEL BY PCR, STOOL (REPLACES STOOL CULTURE)
ASTROVIRUS: NOT DETECTED
Adenovirus F40/41: NOT DETECTED
CAMPYLOBACTER SPECIES: NOT DETECTED
CYCLOSPORA CAYETANENSIS: NOT DETECTED
Cryptosporidium: NOT DETECTED
ENTEROAGGREGATIVE E COLI (EAEC): NOT DETECTED
ENTEROTOXIGENIC E COLI (ETEC): NOT DETECTED
Entamoeba histolytica: NOT DETECTED
Enteropathogenic E coli (EPEC): NOT DETECTED
GIARDIA LAMBLIA: NOT DETECTED
Norovirus GI/GII: NOT DETECTED
PLESIMONAS SHIGELLOIDES: NOT DETECTED
ROTAVIRUS A: NOT DETECTED
Salmonella species: NOT DETECTED
Sapovirus (I, II, IV, and V): NOT DETECTED
Shiga like toxin producing E coli (STEC): NOT DETECTED
Shigella/Enteroinvasive E coli (EIEC): NOT DETECTED
VIBRIO SPECIES: NOT DETECTED
Vibrio cholerae: NOT DETECTED
YERSINIA ENTEROCOLITICA: NOT DETECTED

## 2018-05-08 LAB — COMPREHENSIVE METABOLIC PANEL
ALT: 23 U/L (ref 14–54)
AST: 22 U/L (ref 15–41)
Albumin: 3.6 g/dL (ref 3.5–5.0)
Alkaline Phosphatase: 56 U/L (ref 38–126)
Anion gap: 9 (ref 5–15)
BUN: 10 mg/dL (ref 6–20)
CHLORIDE: 109 mmol/L (ref 101–111)
CO2: 21 mmol/L — ABNORMAL LOW (ref 22–32)
Calcium: 8.6 mg/dL — ABNORMAL LOW (ref 8.9–10.3)
Creatinine, Ser: 0.61 mg/dL (ref 0.44–1.00)
Glucose, Bld: 100 mg/dL — ABNORMAL HIGH (ref 65–99)
POTASSIUM: 3.7 mmol/L (ref 3.5–5.1)
Sodium: 139 mmol/L (ref 135–145)
Total Bilirubin: 0.2 mg/dL — ABNORMAL LOW (ref 0.3–1.2)
Total Protein: 7.5 g/dL (ref 6.5–8.1)

## 2018-05-08 LAB — POCT PREGNANCY, URINE: PREG TEST UR: NEGATIVE

## 2018-05-08 LAB — CBC
HEMATOCRIT: 40.9 % (ref 35.0–47.0)
Hemoglobin: 13.8 g/dL (ref 12.0–16.0)
MCH: 29 pg (ref 26.0–34.0)
MCHC: 33.9 g/dL (ref 32.0–36.0)
MCV: 85.8 fL (ref 80.0–100.0)
Platelets: 308 10*3/uL (ref 150–440)
RBC: 4.76 MIL/uL (ref 3.80–5.20)
RDW: 13.5 % (ref 11.5–14.5)
WBC: 9 10*3/uL (ref 3.6–11.0)

## 2018-05-08 LAB — LIPASE, BLOOD: LIPASE: 50 U/L (ref 11–51)

## 2018-05-08 MED ORDER — LOPERAMIDE HCL 2 MG PO TABS: 2.0000 mg | ORAL_TABLET | Freq: Four times a day (QID) | ORAL | 0 refills | Status: DC | PRN

## 2018-05-08 MED ORDER — ACETAMINOPHEN 325 MG PO TABS
650.0000 mg | ORAL_TABLET | Freq: Once | ORAL | Status: AC
  Administered 2018-05-08: 650 mg via ORAL
  Filled 2018-05-08: qty 2

## 2018-05-08 MED ORDER — LOPERAMIDE HCL 2 MG PO CAPS
4.0000 mg | ORAL_CAPSULE | Freq: Once | ORAL | Status: AC
  Administered 2018-05-08: 4 mg via ORAL
  Filled 2018-05-08: qty 2

## 2018-05-08 MED ORDER — OXYCODONE-ACETAMINOPHEN 5-325 MG PO TABS
2.0000 | ORAL_TABLET | Freq: Once | ORAL | Status: AC
  Administered 2018-05-08: 2 via ORAL
  Filled 2018-05-08: qty 2

## 2018-05-08 MED ORDER — CEPHALEXIN 500 MG PO CAPS: 500.0000 mg | ORAL_CAPSULE | Freq: Four times a day (QID) | ORAL | 0 refills | Status: AC

## 2018-05-08 NOTE — ED Notes (Signed)
NAD noted at time of D/C. Pt denies questions or concerns. Pt ambulatory to the lobby at this time. Pt's friend in lobby to pick her up.

## 2018-05-08 NOTE — ED Provider Notes (Signed)
[facility] Emergency Department Provider Note ____________________________________________   First MD Initiated Contact with Patient 05/08/18 1241     (approximate)  I have reviewed the triage vital signs and the nursing notes.   HISTORY  Chief Complaint Diarrhea    HPI Samantha Villegas is a 27 y.o. female with PMH as noted below who presents with diarrhea, as well as dysuria and flank pain.  Patient states that she initially had diarrhea for approximately 10 days, described as nonbloody, and sometimes with a consistency of mud.  She denies any abdominal pain, fever, or nausea or vomiting before today.  The patient saw her primary care 3 days ago and the doctor felt that she likely had Salmonella because of the timing of the diarrhea.  The patient was started on Cipro and Flagyl.  Patient states that the diarrhea has possibly slightly improved, but she now had some nausea and vomiting, right flank pain, and dysuria.   Past Medical History:  Diagnosis Date  . Asthma   . History of itching   . Pyelonephritis     Patient Active Problem List   Diagnosis Date Noted  . Allergic rhinitis 03/20/2018    Past Surgical History:  Procedure Laterality Date  . CESAREAN SECTION      Prior to Admission medications   Medication Sig Start Date End Date Taking? Authorizing Provider  ciprofloxacin (CIPRO) 500 MG tablet Take 1 tablet (500 mg total) by mouth 2 (two) times daily. 05/05/18   Gabriel Cirri, NP  fluticasone (FLONASE) 50 MCG/ACT nasal spray Place 2 sprays into both nostrils 2 (two) times daily. 03/18/18   Particia Nearing, PA-C  metroNIDAZOLE (FLAGYL) 500 MG tablet Take 1 tablet (500 mg total) by mouth 2 (two) times daily. 05/05/18   Gabriel Cirri, NP  montelukast (SINGULAIR) 5 MG chewable tablet Chew 2 tablets (10 mg total) by mouth at bedtime. 03/18/18   Particia Nearing, PA-C  SPRINTEC 28 0.25-35 MG-MCG tablet  03/13/18   [provider]    Allergies Patient has no known allergies.  Family History  Problem Relation Age of Onset  . Arthritis Mother   . Asthma Mother   . Hyperlipidemia Mother   . Hypertension Mother   . Stroke Mother   . Ulcers Mother   . Drug abuse Brother   . Early death Brother   . Cancer Maternal Grandmother   . Heart disease Paternal Grandmother   . Diabetes Paternal Aunt     Social History Social History   Tobacco Use  . Smoking status: Current Every Day Smoker    Packs/day: 1.00    Types: Cigarettes  . Smokeless tobacco: Never Used  Substance Use Topics  . Alcohol use: Yes  . Drug use: No    Review of Systems  Constitutional: No fever. Eyes: No redness. ENT: No sore throat. Cardiovascular: Denies chest pain. Respiratory: Denies shortness of breath. Gastrointestinal: Positive for nausea and vomiting. Genitourinary: Positive for dysuria.  Musculoskeletal: Negative for back pain. Skin: Negative for rash. Neurological: Negative for headache.   ____________________________________________   PHYSICAL EXAM:  VITAL SIGNS: ED Triage Vitals  Enc Vitals Group     BP 05/08/18 1204 (!) 143/84     Pulse Rate 05/08/18 1204 83     Resp 05/08/18 1204 17     Temp 05/08/18 1204 98.3 F (36.8 C)     Temp Source 05/08/18 1204 Oral     SpO2 05/08/18 1204 99 %  Weight 05/08/18 1205 289 lb (131.1 kg)     Height 05/08/18 1205 5\' 6"  (1.676 m)     Head Circumference --      Peak Flow --      Pain Score 05/08/18 1205 8     Pain Loc --      Pain Edu? --      Excl. in GC? --     Constitutional: Alert and oriented.  Relatively well appearing and in no acute distress. Eyes: Conjunctivae are normal.  Head: Atraumatic. Nose: No congestion/rhinnorhea. Mouth/Throat: Mucous membranes are moist.   Neck: Normal range of motion.  Cardiovascular:  Good peripheral circulation. Respiratory: Normal respiratory effort.  No retractions.  Gastrointestinal: Soft and nontender.  No distention.  Genitourinary: No CVA tenderness.  Mild right flank tenderness. Musculoskeletal: No lower extremity edema.  Extremities warm and well perfused.  Neurologic:  Normal speech and language. No gross focal neurologic deficits are appreciated.  Skin:  Skin is warm and dry. No rash noted. Psychiatric: Mood and affect are normal. Speech and behavior are normal.  ____________________________________________   LABS (all labs ordered are listed, but only abnormal results are displayed)  Labs Reviewed  COMPREHENSIVE METABOLIC PANEL - Abnormal; Notable for the following components:      Result Value   CO2 21 (*)    Glucose, Bld 100 (*)    Calcium 8.6 (*)    Total Bilirubin 0.2 (*)    All other components within normal limits  URINALYSIS, COMPLETE (UACMP) WITH MICROSCOPIC - Abnormal; Notable for the following components:   Color, Urine YELLOW (*)    APPearance HAZY (*)    Hgb urine dipstick LARGE (*)    Protein, ur 30 (*)    Leukocytes, UA TRACE (*)    RBC / HPF >50 (*)    WBC, UA >50 (*)    All other components within normal limits  GASTROINTESTINAL PANEL BY PCR, STOOL (REPLACES STOOL CULTURE)  LIPASE, BLOOD  CBC  POC URINE PREG, ED  POCT PREGNANCY, URINE   ____________________________________________  EKG   ____________________________________________  RADIOLOGY    ____________________________________________   PROCEDURES  Procedure(s) performed: No  Procedures  Critical Care performed: No ____________________________________________   INITIAL IMPRESSION / ASSESSMENT AND PLAN / ED COURSE  Pertinent labs & imaging results that were available during my care of the patient were reviewed by me and considered in my medical decision making (see chart for details).  27 year old female with PMH as noted above presents with diarrhea over approximately the last 2 weeks, initially with no abdominal pain or vomiting.  She states that after being started on  Cipro and Flagyl a few days ago, she is now developed dysuria, right flank pain, and vomiting today.  I reviewed the past medical records in epic; the patient has a note by her primary care from 05/05/2018 stating that she was diagnosing the patient with presumed infectious diarrhea.  On exam, the patient is well-appearing, vital signs are normal, the abdomen is soft and nontender.  The remainder of the exam is as described above.  In terms of the diarrhea, it is possibly infectious, however I have a lower suspicion for bacterial etiology given that the patient has no fever, no blood in the stool, and no abdominal pain or tenderness to suggest colitis.  It is also not significantly improved after the antibiotics.  Urinalysis is consistent with UTI, and the patient's presentation is consistent with UTI/early pyelonephritis.  This may be related to the  frequent diarrhea and bacteria getting into the urinary tract.  I will have the patient discontinue the Cipro and Flagyl, and change her to an antibiotic appropriate for UTI (given the high Cipro resistance in the area).  We will attempt to get a stool sample from the patient to send for PCR.  ----------------------------------------- 3:01 PM on 05/08/2018 -----------------------------------------  Patient has given a stool sample and it has been sent to the lab.  I will await the results of the stool sample before proceeding further, the patient is fine with waiting.  If the stool does not show any acute bacterial etiology, the plan will be to discharge with Keflex for UTI and PMD follow-up.  She continues to appear very comfortable and is tolerating p.o.  I signed the patient out to the oncoming physician Dr. Mayford KnifeWilliams. ____________________________________________   FINAL CLINICAL IMPRESSION(S) / ED DIAGNOSES  Final diagnoses:  Urinary tract infection without hematuria, site unspecified  Diarrhea, unspecified type      NEW MEDICATIONS STARTED  DURING THIS VISIT:  New Prescriptions   No medications on file     Note:  This document was prepared using Dragon voice recognition software and may include unintentional dictation errors.    Dionne BucySiadecki, Alyssah Algeo, MD 05/08/18 [id number]

## 2018-05-08 NOTE — ED Provider Notes (Signed)
Patient is in no distress, no clear etiology for her diarrhea.  We will plan for discharge as previously planned by Dr. Franne GripSiadecki   Samantha Villegas, Cecille AmsterdamJonathan E, MD 05/08/18 [phone]

## 2018-05-08 NOTE — ED Notes (Signed)
Pt presents to ED via POV with c/o diarrhea x 10 days that has progressed to RLQ abdominal pain, and dysuria. Pt states was seen by PCP 3 days ago and started on Cipro and Flagyl for Salmonella, states no stool samples were sent by PCP. Pt states diarrhea worse at night. Pt states rectal pain due to hemorrhoids being irritated from the diarrhea. Pt also c/o burning with urination at this time and c/o lower abdomen/pelvic pain, no change with palpation.

## 2018-05-08 NOTE — ED Triage Notes (Signed)
Pt states she has been sick with diarrhea for over 10 days, states she was seen by her PCP on Thursday and started her on flagyl and cipro for salmonella. Pt states now she is having painful urination with right flank pain..Marland Kitchen

## 2018-05-10 ENCOUNTER — Telehealth: Payer: Self-pay | Admitting: Unknown Physician Specialty

## 2018-05-10 NOTE — Telephone Encounter (Signed)
Happy to provide a note.  Needs to be seen for FMLA due to multiple providers

## 2018-05-10 NOTE — Telephone Encounter (Signed)
Please advise.   Copied from CRM [phone]. Topic: General - Other >> May 10, 2018  8:30 AM Oneal GroutSebastian, Jennifer S wrote: Reason for CRM: Patient seen on 05/05/18 by Gabriel Cirriheryl Wicker, went to ER on Sunday, has appt to follow up with GI and GYN tomorrow and Friday. Requesting work note due to still having diarrhea and UTI. Please advise. Would like to know price for FMLA paperwork.

## 2018-05-10 NOTE — Telephone Encounter (Signed)
Call pt 

## 2018-05-11 ENCOUNTER — Ambulatory Visit (INDEPENDENT_AMBULATORY_CARE_PROVIDER_SITE_OTHER): Payer: Managed Care, Other (non HMO) | Admitting: Unknown Physician Specialty

## 2018-05-11 ENCOUNTER — Other Ambulatory Visit: Payer: Self-pay

## 2018-05-11 ENCOUNTER — Encounter: Payer: Self-pay | Admitting: Unknown Physician Specialty

## 2018-05-11 ENCOUNTER — Ambulatory Visit: Payer: Managed Care, Other (non HMO) | Admitting: Gastroenterology

## 2018-05-11 ENCOUNTER — Ambulatory Visit (INDEPENDENT_AMBULATORY_CARE_PROVIDER_SITE_OTHER): Payer: Managed Care, Other (non HMO) | Admitting: Gastroenterology

## 2018-05-11 ENCOUNTER — Encounter: Payer: Self-pay | Admitting: Gastroenterology

## 2018-05-11 VITALS — BP 115/75 | HR 97 | Resp 18 | Ht 65.5 in | Wt 291.6 lb

## 2018-05-11 VITALS — BP 107/75 | HR 81 | Temp 98.5°F | Ht 65.5 in | Wt 291.0 lb

## 2018-05-11 DIAGNOSIS — R300 Dysuria: Secondary | ICD-10-CM

## 2018-05-11 DIAGNOSIS — N83201 Unspecified ovarian cyst, right side: Secondary | ICD-10-CM | POA: Insufficient documentation

## 2018-05-11 DIAGNOSIS — R197 Diarrhea, unspecified: Secondary | ICD-10-CM | POA: Diagnosis not present

## 2018-05-11 LAB — MICROSCOPIC EXAMINATION: Bacteria, UA: NONE SEEN

## 2018-05-11 LAB — UA/M W/RFLX CULTURE, ROUTINE
BILIRUBIN UA: NEGATIVE
GLUCOSE, UA: NEGATIVE
Nitrite, UA: NEGATIVE
PH UA: 6 (ref 5.0–7.5)
PROTEIN UA: NEGATIVE
SPEC GRAV UA: 1.02 (ref 1.005–1.030)
Urobilinogen, Ur: 0.2 mg/dL (ref 0.2–1.0)

## 2018-05-11 NOTE — Assessment & Plan Note (Signed)
Persistent.  Seeing GI.  Still giving her problems

## 2018-05-11 NOTE — Progress Notes (Signed)
BP 107/75   Pulse 81   Temp 98.5 F (36.9 C) (Oral)   Ht 5' 5.5" (1.664 m)   Wt 291 lb (132 kg)   LMP 05/06/2018 (Exact Date)   SpO2 98%   BMI 47.69 kg/m    Subjective:    Patient ID: Samantha Villegas, female    DOB: 02-24-1991, 27 y.o.   MRN: [id number]  HPI: Samantha Villegas is a 27 y.o. female  Chief Complaint  Patient presents with  . Paperwork   Pt presented here for diarrhea.  Started on antibioitcs.  Not improvement.  Went to the ER.  Stopped antibiotics and referred to GI and cyst on ovary found during evaluation.  Needs FMLA/ADA forms for her job. She was also diagnosed with a UTI and has been on Cephalexen.  Still on Metronidazole.      Currently pt has pelvic pressure and pain with urination.  States she continues to have soft stool twice a day with Immodium.  She saw GI who agrees with food poisoning.  Encouraged to continue Immodium as tolerated.  Out of work 5/30 to 6/10.  Needs to be out of work this week due to f/u visits throughout the week with GI and Gyn.  Unable to work with a UTI due to lack of breaks.    Relevant past medical, surgical, family and social history reviewed and updated as indicated. Interim medical history since our last visit reviewed. Allergies and medications reviewed and updated.  Review of Systems  Constitutional: Negative.   HENT: Negative.   Respiratory: Negative.   Cardiovascular: Negative.   Psychiatric/Behavioral: Negative.     Per HPI unless specifically indicated above     Objective:    BP 107/75   Pulse 81   Temp 98.5 F (36.9 C) (Oral)   Ht 5' 5.5" (1.664 m)   Wt 291 lb (132 kg)   LMP 05/06/2018 (Exact Date)   SpO2 98%   BMI 47.69 kg/m   Wt Readings from Last 3 Encounters:  05/11/18 291 lb (132 kg)  05/11/18 291 lb 9.6 oz (132.3 kg)  05/08/18 289 lb (131.1 kg)    Physical Exam  Constitutional: She is oriented to person, place, and time. She appears well-developed and well-nourished. No distress.    HENT:  Head: Normocephalic and atraumatic.  Eyes: Conjunctivae and lids are normal. Right eye exhibits no discharge. Left eye exhibits no discharge. No scleral icterus.  Neck: Normal range of motion. Neck supple. No JVD present. Carotid bruit is not present.  Cardiovascular: Normal rate, regular rhythm and normal heart sounds.  Pulmonary/Chest: Effort normal and breath sounds normal.  Abdominal: Soft. Normal appearance. There is no splenomegaly or hepatomegaly. There is no tenderness.  Musculoskeletal: Normal range of motion.  Neurological: She is alert and oriented to person, place, and time.  Skin: Skin is warm, dry and intact. No rash noted. No pallor.  Psychiatric: She has a normal mood and affect. Her behavior is normal. Judgment and thought content normal.    Results for orders placed or performed during the hospital encounter of 05/08/18  Gastrointestinal Panel by PCR , Stool  Result Value Ref Range   Campylobacter species NOT DETECTED NOT DETECTED   Plesimonas shigelloides NOT DETECTED NOT DETECTED   Salmonella species NOT DETECTED NOT DETECTED   Yersinia enterocolitica NOT DETECTED NOT DETECTED   Vibrio species NOT DETECTED NOT DETECTED   Vibrio cholerae NOT DETECTED NOT DETECTED   Enteroaggregative E coli (EAEC) NOT DETECTED  NOT DETECTED   Enteropathogenic E coli (EPEC) NOT DETECTED NOT DETECTED   Enterotoxigenic E coli (ETEC) NOT DETECTED NOT DETECTED   Shiga like toxin producing E coli (STEC) NOT DETECTED NOT DETECTED   Shigella/Enteroinvasive E coli (EIEC) NOT DETECTED NOT DETECTED   Cryptosporidium NOT DETECTED NOT DETECTED   Cyclospora cayetanensis NOT DETECTED NOT DETECTED   Entamoeba histolytica NOT DETECTED NOT DETECTED   Giardia lamblia NOT DETECTED NOT DETECTED   Adenovirus F40/41 NOT DETECTED NOT DETECTED   Astrovirus NOT DETECTED NOT DETECTED   Norovirus GI/GII NOT DETECTED NOT DETECTED   Rotavirus A NOT DETECTED NOT DETECTED   Sapovirus (I, II, IV, and V)  NOT DETECTED NOT DETECTED  Lipase, blood  Result Value Ref Range   Lipase 50 11 - 51 U/L  Comprehensive metabolic panel  Result Value Ref Range   Sodium 139 135 - 145 mmol/L   Potassium 3.7 3.5 - 5.1 mmol/L   Chloride 109 101 - 111 mmol/L   CO2 21 (L) 22 - 32 mmol/L   Glucose, Bld 100 (H) 65 - 99 mg/dL   BUN 10 6 - 20 mg/dL   Creatinine, Ser 9.140.61 0.44 - 1.00 mg/dL   Calcium 8.6 (L) 8.9 - 10.3 mg/dL   Total Protein 7.5 6.5 - 8.1 g/dL   Albumin 3.6 3.5 - 5.0 g/dL   AST 22 15 - 41 U/L   ALT 23 14 - 54 U/L   Alkaline Phosphatase 56 38 - 126 U/L   Total Bilirubin 0.2 (L) 0.3 - 1.2 mg/dL   GFR calc non Af Amer >60 >60 mL/min   GFR calc Af Amer >60 >60 mL/min   Anion gap 9 5 - 15  CBC  Result Value Ref Range   WBC 9.0 3.6 - 11.0 K/uL   RBC 4.76 3.80 - 5.20 MIL/uL   Hemoglobin 13.8 12.0 - 16.0 g/dL   HCT 78.240.9 95.635.0 - 21.347.0 %   MCV 85.8 80.0 - 100.0 fL   MCH 29.0 26.0 - 34.0 pg   MCHC 33.9 32.0 - 36.0 g/dL   RDW 08.613.5 57.811.5 - 46.914.5 %   Platelets 308 150 - 440 K/uL  Urinalysis, Complete w Microscopic  Result Value Ref Range   Color, Urine YELLOW (A) YELLOW   APPearance HAZY (A) CLEAR   Specific Gravity, Urine 1.029 1.005 - 1.030   pH 6.0 5.0 - 8.0   Glucose, UA NEGATIVE NEGATIVE mg/dL   Hgb urine dipstick LARGE (A) NEGATIVE   Bilirubin Urine NEGATIVE NEGATIVE   Ketones, ur NEGATIVE NEGATIVE mg/dL   Protein, ur 30 (A) NEGATIVE mg/dL   Nitrite NEGATIVE NEGATIVE   Leukocytes, UA TRACE (A) NEGATIVE   RBC / HPF >50 (H) 0 - 5 RBC/hpf   WBC, UA >50 (H) 0 - 5 WBC/hpf   Bacteria, UA NONE SEEN NONE SEEN   Squamous Epithelial / LPF 0-5 0 - 5   Mucus PRESENT   Pregnancy, urine POC  Result Value Ref Range   Preg Test, Ur NEGATIVE NEGATIVE      Assessment & Plan:   Problem List Items Addressed This Visit      Unprioritized   Acute diarrhea    Persistent.  Seeing GI.  Still giving her problems      Right ovarian cyst    Noted on CT stone study.  Referred to OB-GYN.  I suspect the  cyst is responsible for symptoms.         Other Visit Diagnoses  Dysuria    -  Primary   Urine is normal.  I suspect urinary symptoms related to cyst on ovaries.  Will send urine for a culture.    Relevant Orders   UA/M w/rflx Culture, Routine   Urine Culture      Further discussion indicates that she has had similar right sided fullness and dysuria for quite some time.  Previous recommendations have been to see gyn as a negative w/u  Will bring in paperwork to fill out.    Follow up plan: Return if symptoms worsen or fail to improve. and with GI and Gyn

## 2018-05-11 NOTE — Progress Notes (Signed)
Arlyss Repress, MD [address]  [address]  [place], [place] [address]  Main: [phone]  Fax: [phone]    Gastroenterology Consultation  Referring Provider:     Particia Nearing,* Primary Care Physician:  Particia Nearing, PA-C Primary Gastroenterologist:  Dr. Arlyss Repress Reason for Consultation: acute diarrhea        HPI:   Samantha Villegas is a 27 y.o. female referred by Dr. Maurice March, Salley Hews, PA-C  for consultation & management of acute diarrhea. She reports that the last 2 weeks, she has been experiencing several episodes of nonbloody diarrhea. She reports that she had hamburger before the onset of symptoms. She was told that she may have Salmonella and was started on Cipro and Flagyl by her primary care provider. However, her symptoms got worse associated with lower abdominal cramps, nausea and went to the ER on 05/08/2018. She was told that she has UTI and was recommended to stop ciprofloxacin but continue Flagyl, she was also discharged on Keflex for UTI. She had GI pathogen panel done at that time and it was negative. However, C. Difficile was not checked. She was discharged on Imodium needed for the last 2 days she is taking Imodium up to 2 pills daily and reports that she has been having 1 or 2 bowel movements, soft in consistency and nonbloody. She reports mild nausea but otherwise denies vomiting. She reports mild right upper quadrant pain radiating to the back. She also had a CT yesterday protocol in the ER and showed mild enlarged right lobe of the liver. Otherwise normal gallbladder and normal CBD. She had possible thickening of the ascending colon.  She used to drink carbonated beverages and stopped about 3 days ago. She used to consume red meat regularly which she discontinued a few weeks ago. She denies fever, chills, weight loss Her labs including CBC, CMP, lipase was unremarkable in the ER She reports being asymptomatic without any GI  symptoms prior to this episode. She used to have 1 or 2 formed bowel movements daily  NSAIDs: none  Antiplts/Anticoagulants/Anti thrombotics: none  GI Procedures: none She denies family history of inflammatory bowel disease, GI malignancy  Past Medical History:  Diagnosis Date  . Asthma   . History of itching   . Pyelonephritis     Past Surgical History:  Procedure Laterality Date  . CESAREAN SECTION      Current Outpatient Medications:  .  cephALEXin (KEFLEX) 500 MG capsule, Take 1 capsule (500 mg total) by mouth 4 (four) times daily for 10 days., Disp: 40 capsule, Rfl: 0 .  ciprofloxacin (CIPRO) 500 MG tablet, Take 1 tablet (500 mg total) by mouth 2 (two) times daily. (Patient not taking: Reported on 05/11/2018), Disp: 14 tablet, Rfl: 0 .  fluticasone (FLONASE) 50 MCG/ACT nasal spray, Place 2 sprays into both nostrils 2 (two) times daily. (Patient taking differently: Place 2 sprays into both nostrils 2 (two) times daily as needed for allergies. ), Disp: 16 g, Rfl: 11 .  loperamide (IMODIUM A-D) 2 MG tablet, Take 1 tablet (2 mg total) by mouth 4 (four) times daily as needed for diarrhea or loose stools., Disp: 30 tablet, Rfl: 0 .  metroNIDAZOLE (FLAGYL) 500 MG tablet, Take 1 tablet (500 mg total) by mouth 2 (two) times daily., Disp: 14 tablet, Rfl: 0 .  montelukast (SINGULAIR) 5 MG chewable tablet, Chew 2 tablets (10 mg total) by mouth at bedtime., Disp: 60 tablet, Rfl: 11 .  SPRINTEC  28 0.25-35 MG-MCG tablet, Take 1 tablet by mouth daily. Three weeks active tablets- 7 days inactive tablets., Disp: , Rfl:    Family History  Problem Relation Age of Onset  . Arthritis Mother   . Asthma Mother   . Hyperlipidemia Mother   . Hypertension Mother   . Stroke Mother   . Ulcers Mother   . Drug abuse Brother   . Early death Brother   . Cancer Maternal Grandmother   . Heart disease Paternal Grandmother   . Diabetes Paternal Aunt      Social History   Tobacco Use  . Smoking status:  Current Every Day Smoker    Packs/day: 1.00    Types: Cigarettes  . Smokeless tobacco: Never Used  Substance Use Topics  . Alcohol use: Yes  . Drug use: No    Allergies as of 05/11/2018  . (No Known Allergies)    Review of Systems:    All systems reviewed and negative except where noted in HPI.   Physical Exam:  BP 115/75 (BP Location: Left Arm, Patient Position: Sitting, Cuff Size: Large)   Pulse 97   Resp 18   Ht 5' 5.5" (1.664 m)   Wt 291 lb 9.6 oz (132.3 kg)   LMP 05/06/2018 (Exact Date)   BMI 47.79 kg/m  Patient's last menstrual period was 05/06/2018 (exact date).  General:   Alert, obese, Well-developed, well-nourished, pleasant and cooperative in NAD Head:  Normocephalic and atraumatic. Eyes:  Sclera clear, no icterus.   Conjunctiva pink. Ears:  Normal auditory acuity. Nose:  No deformity, discharge, or lesions. Mouth:  No deformity or lesions,oropharynx pink & moist. Neck:  Supple; no masses or thyromegaly. Lungs:  Respirations even and unlabored.  Clear throughout to auscultation.   No wheezes, crackles, or rhonchi. No acute distress. Heart:  Regular rate and rhythm; no murmurs, clicks, rubs, or gallops. Abdomen:  Normal bowel sounds. Soft, orbidly obese, non-tender and non-distended, limited abdominal exam due to body habitus.  No guarding or rebound tenderness.   Rectal: Not performed Msk:  Symmetrical without gross deformities. Good, equal movement & strength bilaterally. Pulses:  Normal pulses noted. Extremities:  No clubbing or edema.  No cyanosis. Neurologic:  Alert and oriented x3;  grossly normal neurologically. Skin:  Intact without significant lesions or rashes. No jaundice. Lymph Nodes:  No significant cervical adenopathy. Psych:  Alert and cooperative. Normal mood and affect.  Imaging Studies: reviewed  Assessment and Plan:   [name] is a 27 y.o. female with morbid obesity, with 2 weeks history of nonbloody diarrhea, GI pathogen  panel negative, empirically on metronidazole. Currently, her diarrhea is controlled on Imodium. CT with nonspecific thickening of ascending colon. Her diarrheal episode is self-limited.  - Continue Imodium as needed - trial of probiotics VSL #3, samples provided - complete course of metronidazole - No further workup at this time unless diarrhea recurs   Follow up in 3 weeks   Arlyss Repressohini R Vanga, MD

## 2018-05-11 NOTE — Assessment & Plan Note (Signed)
Noted on CT stone study.  Referred to OB-GYN.  I suspect the cyst is responsible for symptoms.

## 2018-05-13 ENCOUNTER — Encounter: Payer: Self-pay | Admitting: Obstetrics and Gynecology

## 2018-05-13 ENCOUNTER — Ambulatory Visit (INDEPENDENT_AMBULATORY_CARE_PROVIDER_SITE_OTHER): Payer: Managed Care, Other (non HMO) | Admitting: Obstetrics and Gynecology

## 2018-05-13 VITALS — BP 112/78 | HR 68 | Ht 66.0 in | Wt 288.0 lb

## 2018-05-13 DIAGNOSIS — N83201 Unspecified ovarian cyst, right side: Secondary | ICD-10-CM

## 2018-05-13 DIAGNOSIS — R102 Pelvic and perineal pain: Secondary | ICD-10-CM

## 2018-05-13 DIAGNOSIS — A09 Infectious gastroenteritis and colitis, unspecified: Secondary | ICD-10-CM

## 2018-05-13 NOTE — Progress Notes (Signed)
Patient ID: Samantha Villegas, female   DOB: 05/30/1991, 27 y.o.   MRN: [id number]  Reason for Consult: Referral (dyspareunia)   Referred by Particia NearingLane, Rachel Elizabeth,*  Subjective:     HPI:  Samantha Villegas is a 27 y.o. female. She has been having pelvic pain. Recent renal CT showed a 5 cm ovarian cyst.   Gynecological History  Patient's last menstrual period was 05/06/2018 (exact date).   History of fibroids, polyps, or ovarian cysts? : no  History of PCOS? no Hstory of Endometriosis? no History of abnormal pap smears? no Have you had any sexually transmitted infections in the past? no  Past Medical History:  Diagnosis Date  . Anxiety    no meds  . Asthma    h/o no inhalers  . GERD (gastroesophageal reflux disease)    no meds  . History of itching   . Pyelonephritis   . Skipped heart beats    pt states this happens occ but is asymptomatic   Family History  Problem Relation Age of Onset  . Arthritis Mother   . Asthma Mother   . Hyperlipidemia Mother   . Hypertension Mother   . Stroke Mother   . Ulcers Mother   . Drug abuse Brother   . Early death Brother   . Cancer Maternal Grandmother   . Heart disease Paternal Grandmother   . Diabetes Paternal Aunt    Past Surgical History:  Procedure Laterality Date  . CESAREAN SECTION    . LAPAROSCOPIC OVARIAN CYSTECTOMY Right 05/26/2018   Procedure: LAPAROSCOPIC RIGHT OVARIAN CYSTECTOMY;  Surgeon: Natale MilchSchuman, Christanna R, MD;  Location: [facility] ORS;  Service: Gynecology;  Laterality: Right;    Short Social History:  Social History   Tobacco Use  . Smoking status: Current Every Day Smoker    Packs/day: 1.00    Years: 10.00    Pack years: 10.00    Types: Cigarettes    Last attempt to quit: 08/08/2018    Years since quitting: 2.6  . Smokeless tobacco: Never Used  Substance Use Topics  . Alcohol use: Yes    Comment: social    Allergies  Allergen Reactions  . Latex Other (See Comments)    With latex  condoms--uti/bacterial infections.    Current Outpatient Medications  Medication Sig Dispense Refill  . acetaminophen (TYLENOL) 500 MG tablet Take 1,000 mg by mouth every 6 (six) hours as needed (for pain.).    Marland Kitchen. albuterol (PROAIR HFA) 108 (90 Base) MCG/ACT inhaler Inhale 2 puffs into the lungs every 6 (six) hours as needed for wheezing or shortness of breath. 18 g 3  . buPROPion (WELLBUTRIN SR) 150 MG 12 hr tablet START OUT TAKING 150 MG (1 TABLET) ONCE DAILY BY MOUTH FOR 3 DAYS THEN INCREASE TO 150 MG (1 TABLET) BY MOUTH TWICE DAILY. (Patient not taking: Reported on 03/20/2021) 180 tablet 0  . fluticasone (FLONASE) 50 MCG/ACT nasal spray Place 2 sprays into both nostrils 2 (two) times daily. 16 g 11  . ibuprofen (ADVIL) 600 MG tablet TAKE 1 TABLET (600 MG TOTAL) BY MOUTH EVERY SIX (6) HOURS.    . metroNIDAZOLE (METROGEL VAGINAL) 0.75 % vaginal gel Place 1 Applicatorful vaginally at bedtime. 70 g 0  . montelukast (SINGULAIR) 10 MG tablet Take 1 tablet (10 mg total) by mouth at bedtime. 30 tablet 3  . pantoprazole (PROTONIX) 40 MG tablet Take 1 tablet (40 mg total) by mouth daily. (Patient not taking: Reported on 03/20/2021) 30 tablet 1  .  triamcinolone (KENALOG) 0.025 % cream Apply 1 application topically 2 (two) times daily. 30 g 0   No current facility-administered medications for this visit.    REVIEW OF SYSTEMS      Objective:  Objective   Vitals:   05/13/18 1000  BP: 112/78  Pulse: 68  Weight: 288 lb (130.6 kg)  Height: 5\' 6"  (1.676 m)   Body mass index is 46.48 kg/m.  Physical Exam  Assessment/Plan:    27 yo with pelvic pain and ovarian cyst Pain, ovarian cyst, 5cm Treatment with 2 weeks of flagyl, possibly for suspected C.Diff colitits. Will obtain pelvic US to characterize the ovarian cyst. Can plan to remove cyst laparoscopically.    Adelene Idler MD [facility], [facility] 05/13/18 10:53 AM

## 2018-05-14 ENCOUNTER — Encounter: Payer: Self-pay | Admitting: Unknown Physician Specialty

## 2018-05-16 ENCOUNTER — Encounter: Payer: Self-pay | Admitting: Unknown Physician Specialty

## 2018-05-16 ENCOUNTER — Ambulatory Visit (INDEPENDENT_AMBULATORY_CARE_PROVIDER_SITE_OTHER): Payer: Managed Care, Other (non HMO)

## 2018-05-16 ENCOUNTER — Encounter: Payer: Self-pay | Admitting: Obstetrics and Gynecology

## 2018-05-16 ENCOUNTER — Ambulatory Visit (INDEPENDENT_AMBULATORY_CARE_PROVIDER_SITE_OTHER): Payer: Managed Care, Other (non HMO) | Admitting: Obstetrics and Gynecology

## 2018-05-16 VITALS — BP 136/78 | HR 87 | Ht 66.0 in | Wt 288.0 lb

## 2018-05-16 DIAGNOSIS — N83201 Unspecified ovarian cyst, right side: Secondary | ICD-10-CM

## 2018-05-16 DIAGNOSIS — R102 Pelvic and perineal pain: Secondary | ICD-10-CM

## 2018-05-16 NOTE — H&P (View-Only) (Signed)
Patient ID: Samantha Villegas, female   DOB: [birth year], 27 y.o.   MRN: [id number]  Reason for Consult: No chief complaint on file.   Referred by Particia Nearing,*  Subjective:     HPI:  Samantha Villegas is a 27 y.o. female She presents today for follow up of an ovarian cyst which was seen on a recent CT scan.   Past Medical History:  Diagnosis Date  . Asthma   . History of itching   . Pyelonephritis    Family History  Problem Relation Age of Onset  . Arthritis Mother   . Asthma Mother   . Hyperlipidemia Mother   . Hypertension Mother   . Stroke Mother   . Ulcers Mother   . Drug abuse Brother   . Early death Brother   . Cancer Maternal Grandmother   . Heart disease Paternal Grandmother   . Diabetes Paternal Aunt    Past Surgical History:  Procedure Laterality Date  . CESAREAN SECTION      Short Social History:  Social History   Tobacco Use  . Smoking status: Current Every Day Smoker    Packs/day: 1.00    Types: Cigarettes  . Smokeless tobacco: Never Used  Substance Use Topics  . Alcohol use: Yes    No Known Allergies  Current Outpatient Medications  Medication Sig Dispense Refill  . cephALEXin (KEFLEX) 500 MG capsule Take 1 capsule (500 mg total) by mouth 4 (four) times daily for 10 days. 40 capsule 0  . fluticasone (FLONASE) 50 MCG/ACT nasal spray Place 2 sprays into both nostrils 2 (two) times daily. (Patient taking differently: Place 2 sprays into both nostrils 2 (two) times daily as needed for allergies. ) 16 g 11  . loperamide (IMODIUM A-D) 2 MG tablet Take 1 tablet (2 mg total) by mouth 4 (four) times daily as needed for diarrhea or loose stools. 30 tablet 0  . montelukast (SINGULAIR) 5 MG chewable tablet Chew 2 tablets (10 mg total) by mouth at bedtime. 60 tablet 11  . SPRINTEC 28 0.25-35 MG-MCG tablet Take 1 tablet by mouth daily. Three weeks active tablets- 7 days inactive tablets.     No current facility-administered medications  for this visit.     Review of Systems  Constitutional: Negative for chills, fatigue, fever and unexpected weight change.  HENT: Negative for trouble swallowing.  Eyes: Negative for loss of vision.  Respiratory: Negative for cough, shortness of breath and wheezing.  Cardiovascular: Negative for chest pain, leg swelling, palpitations and syncope.  GI: Negative for abdominal pain, blood in stool, diarrhea, nausea and vomiting.  GU: Negative for difficulty urinating, dysuria, frequency and hematuria.  Musculoskeletal: Negative for back pain, leg pain and joint pain.  Skin: Negative for rash.  Neurological: Negative for dizziness, headaches, light-headedness, numbness and seizures.  Psychiatric: Negative for behavioral problem, confusion, depressed mood and sleep disturbance.        Objective:  Objective   There were no vitals filed for this visit. There is no height or weight on file to calculate BMI.  Physical Exam  Constitutional: She is oriented to person, place, and time. She appears well-developed and well-nourished.  HENT:  Head: Normocephalic and atraumatic.  Eyes: EOM are normal.  Cardiovascular: Normal rate, regular rhythm and normal heart sounds.  Pulmonary/Chest: Effort normal and breath sounds normal.  Neurological: She is alert and oriented to person, place, and time.  Skin: Skin is warm and dry.  Psychiatric: She has a  normal mood and affect. Her behavior is normal. Judgment and thought content normal.  Nursing note and vitals reviewed.   [place] Pelvis Transvanginal Non-ob (tv Only)  Result Date: 05/16/2018 Patient Name: Samantha Villegas DOB: 11/27/1991 MRN: [id number] ULTRASOUND REPORT Location: [facility] Date of Service: 05/16/2018 Indications:Pelvic Pain, Cyst of Right Ovary Findings: The uterus is anteverted and measures 7.69 x 5.27 x 3.57 cm. Echo texture is homogenous without evidence of focal masses. The Endometrium measures 6.08 mm. Right Ovary measures  5.45 x 5.55 x 4.11 cm. It contain simple cyst measures 4.4 x 3.0 x 4.8 cm. Left Ovary measures 4.08 x 3.22 x 2.95 cm. It is normal in appearance. Survey of the adnexa demonstrates no adnexal masses. There is no free fluid in the cul de sac. Impression: 1. Right ovary contain simple cyst measures 4.4 x 3.0 x 4.8 cm. Recommendations: 1.Clinical correlation with the patient's History and Physical Exam. 2. Simple cyst, repeat ultrasound in 8-12 weeks. Mital bahen Leodis BinetP Patel, RDMS I have reviewed this ultrasound and the report. I agree with the above assessment and plan. Adelene Idlerhristanna Lynley Killilea MD [facility] 05/16/18 2:18 PM         Assessment/Plan:     27 yo with right ovarian cyst.  Discussed options of expectant management vs. laparoscopic right cystectomy.  Patient will consider her options and decide. This pain has been persistent for 4 months and caused her significant emotional distress.   More than 25 minutes were spent face to face with the patient in the room with more than 50% of the time spent providing counseling and discussing the plan of management.     Adelene Idlerhristanna Jersee Winiarski MD [facility], [facility] 05/16/18 12:07 PM

## 2018-05-16 NOTE — Progress Notes (Signed)
Patient ID: Samantha Villegas, female   DOB: [birth year], 27 y.o.   MRN: [id number]  Reason for Consult: No chief complaint on file.   Referred by Particia Nearing,*  Subjective:     HPI:  Samantha Villegas is a 27 y.o. female She presents today for follow up of an ovarian cyst which was seen on a recent CT scan.   Past Medical History:  Diagnosis Date  . Asthma   . History of itching   . Pyelonephritis    Family History  Problem Relation Age of Onset  . Arthritis Mother   . Asthma Mother   . Hyperlipidemia Mother   . Hypertension Mother   . Stroke Mother   . Ulcers Mother   . Drug abuse Brother   . Early death Brother   . Cancer Maternal Grandmother   . Heart disease Paternal Grandmother   . Diabetes Paternal Aunt    Past Surgical History:  Procedure Laterality Date  . CESAREAN SECTION      Short Social History:  Social History   Tobacco Use  . Smoking status: Current Every Day Smoker    Packs/day: 1.00    Types: Cigarettes  . Smokeless tobacco: Never Used  Substance Use Topics  . Alcohol use: Yes    No Known Allergies  Current Outpatient Medications  Medication Sig Dispense Refill  . cephALEXin (KEFLEX) 500 MG capsule Take 1 capsule (500 mg total) by mouth 4 (four) times daily for 10 days. 40 capsule 0  . fluticasone (FLONASE) 50 MCG/ACT nasal spray Place 2 sprays into both nostrils 2 (two) times daily. (Patient taking differently: Place 2 sprays into both nostrils 2 (two) times daily as needed for allergies. ) 16 g 11  . loperamide (IMODIUM A-D) 2 MG tablet Take 1 tablet (2 mg total) by mouth 4 (four) times daily as needed for diarrhea or loose stools. 30 tablet 0  . montelukast (SINGULAIR) 5 MG chewable tablet Chew 2 tablets (10 mg total) by mouth at bedtime. 60 tablet 11  . SPRINTEC 28 0.25-35 MG-MCG tablet Take 1 tablet by mouth daily. Three weeks active tablets- 7 days inactive tablets.     No current facility-administered medications  for this visit.     Review of Systems  Constitutional: Negative for chills, fatigue, fever and unexpected weight change.  HENT: Negative for trouble swallowing.  Eyes: Negative for loss of vision.  Respiratory: Negative for cough, shortness of breath and wheezing.  Cardiovascular: Negative for chest pain, leg swelling, palpitations and syncope.  GI: Negative for abdominal pain, blood in stool, diarrhea, nausea and vomiting.  GU: Negative for difficulty urinating, dysuria, frequency and hematuria.  Musculoskeletal: Negative for back pain, leg pain and joint pain.  Skin: Negative for rash.  Neurological: Negative for dizziness, headaches, light-headedness, numbness and seizures.  Psychiatric: Negative for behavioral problem, confusion, depressed mood and sleep disturbance.        Objective:  Objective   There were no vitals filed for this visit. There is no height or weight on file to calculate BMI.  Physical Exam  Constitutional: She is oriented to person, place, and time. She appears well-developed and well-nourished.  HENT:  Head: Normocephalic and atraumatic.  Eyes: EOM are normal.  Cardiovascular: Normal rate, regular rhythm and normal heart sounds.  Pulmonary/Chest: Effort normal and breath sounds normal.  Neurological: She is alert and oriented to person, place, and time.  Skin: Skin is warm and dry.  Psychiatric: She has a  normal mood and affect. Her behavior is normal. Judgment and thought content normal.  Nursing note and vitals reviewed.   [place] Pelvis Transvanginal Non-ob (tv Only)  Result Date: 05/16/2018 Patient Name: Samantha Villegas DOB: 11/27/1991 MRN: [id number] ULTRASOUND REPORT Location: [facility] Date of Service: 05/16/2018 Indications:Pelvic Pain, Cyst of Right Ovary Findings: The uterus is anteverted and measures 7.69 x 5.27 x 3.57 cm. Echo texture is homogenous without evidence of focal masses. The Endometrium measures 6.08 mm. Right Ovary measures  5.45 x 5.55 x 4.11 cm. It contain simple cyst measures 4.4 x 3.0 x 4.8 cm. Left Ovary measures 4.08 x 3.22 x 2.95 cm. It is normal in appearance. Survey of the adnexa demonstrates no adnexal masses. There is no free fluid in the cul de sac. Impression: 1. Right ovary contain simple cyst measures 4.4 x 3.0 x 4.8 cm. Recommendations: 1.Clinical correlation with the patient's History and Physical Exam. 2. Simple cyst, repeat ultrasound in 8-12 weeks. Mital bahen Leodis BinetP Patel, RDMS I have reviewed this ultrasound and the report. I agree with the above assessment and plan. Adelene Idlerhristanna Jaymin Waln MD [facility] 05/16/18 2:18 PM         Assessment/Plan:     27 yo with right ovarian cyst.  Discussed options of expectant management vs. laparoscopic right cystectomy.  Patient will consider her options and decide. This pain has been persistent for 4 months and caused her significant emotional distress.   More than 25 minutes were spent face to face with the patient in the room with more than 50% of the time spent providing counseling and discussing the plan of management.     Adelene Idlerhristanna Jetson Pickrel MD [facility], [facility] 05/16/18 12:07 PM

## 2018-05-18 ENCOUNTER — Ambulatory Visit: Payer: Self-pay | Admitting: *Deleted

## 2018-05-18 ENCOUNTER — Telehealth: Payer: Self-pay | Admitting: Family Medicine

## 2018-05-18 ENCOUNTER — Encounter: Payer: Self-pay | Admitting: Obstetrics and Gynecology

## 2018-05-18 LAB — NUSWAB VAGINITIS PLUS (VG+)
CANDIDA ALBICANS, NAA: NEGATIVE
Candida glabrata, NAA: NEGATIVE
Chlamydia trachomatis, NAA: NEGATIVE
Neisseria gonorrhoeae, NAA: NEGATIVE
Trich vag by NAA: NEGATIVE

## 2018-05-18 NOTE — Progress Notes (Signed)
Negative, released to mychart

## 2018-05-18 NOTE — Telephone Encounter (Unsigned)
Copied from CRM [id number]. Topic: Quick Communication - Lab Results >> May 18, 2018 10:05 AM Mare LoanBurton, Donna F wrote: Pt is needing to get lab results from 05/11/18 visit   Best number to call [phone]

## 2018-05-18 NOTE — Telephone Encounter (Signed)
Looks like Elnita MaxwellCheryl released her labs on Beul[facility]le[facility]. Can you please relay them to patient.

## 2018-05-18 NOTE — Telephone Encounter (Signed)
Pt reports severe constant 10/10 pain right lower back to flank area, "From ribs on down." Onset 1 week ago, was intermittent, now constant. Presented to ED 05/08/18 with similar symptoms as well as diarrhea. Referred to GI, right ovarian cyst found on scan. Saw OB/GYN 05/16/18; pt states she was told pain is not related to cyst. Also reports abdominal pain below umbilicus, dysuria and urinary frequency.Denies any visible hematuria.  Unsure if febrile but reports "hot flashes." Pt states she has completed all antibiotics she has been RX since onset. Taking motrin, ineffective.  Pt tearful during call. Directed to ED. Pt states will follow disposition.  Reason for Disposition . [1] SEVERE pain (e.g., excruciating, scale 8-10) AND [2] present > 1 hour  Answer Assessment - Initial Assessment Questions 1. LOCATION: "Where does it hurt?" (e.g., left, right)     Right back to side, entire side 2. ONSET: "When did the pain start?"     1 week ago 3. SEVERITY: "How bad is the pain?" (e.g., Scale 1-10; mild, moderate, or severe)   - MILD (1-3): doesn't interfere with normal activities    - MODERATE (4-7): interferes with normal activities or awakens from sleep    - SEVERE (8-10): excruciating pain and patient unable to do normal activities (stays in bed)       10/10 constant last 2 days 4. PATTERN: "Does the pain come and go, or is it constant?"      constant 5. CAUSE: "What do you think is causing the pain?"     Has ovarian cyst 6. OTHER SYMPTOMS:  "Do you have any other symptoms?" (e.g., fever, abdominal pain, vomiting, leg weakness, burning with urination, blood in urine)     Abdominal pain below umbilicus, pressure 7. PREGNANCY:  "Is there any chance you are pregnant?" "When was your last menstrual period?"     no  Protocols used: FLANK PAIN-A-AH

## 2018-05-18 NOTE — Telephone Encounter (Signed)
Tried to relay message to patient, and she stated [facility] Ovarian [facility] and is getting her labs repeated.

## 2018-05-19 ENCOUNTER — Encounter: Payer: Self-pay | Admitting: Obstetrics and Gynecology

## 2018-05-19 ENCOUNTER — Ambulatory Visit: Payer: Managed Care, Other (non HMO) | Admitting: Physician Assistant

## 2018-05-19 ENCOUNTER — Telehealth: Payer: Self-pay | Admitting: Obstetrics and Gynecology

## 2018-05-19 NOTE — Telephone Encounter (Signed)
-----   Message from Natale Milchhristanna R Schuman, MD sent at 05/18/2018  7:33 PM EDT ----- Surgery Booking Request Patient Full Name:  Barrie DunkerVanessa Taneisha Janvrin  MRN: [id number]  DOB: 02/08/1991  Surgeon: Natale Milchhristanna R Schuman, MD  Requested Surgery Date and Time: next week Primary Diagnosis AND Code:  Ovarian cyst Secondary Diagnosis and Code:  Surgical Procedure: laparoscopic right ovarian cystectomy, possible right oophorectomy L&D Notification: No Admission Status: same day surgery Length of Surgery: 1.5 hour Special Case Needs: non H&P: can do in pacu (date) Phone Interview???: yes Interpreter: Language:  Medical Clearance: no Special Scheduling Instructions: none

## 2018-05-19 NOTE — Telephone Encounter (Signed)
Patient is aware of consents day of surgery, Pre-admit Testing phone interview to be scheduled, and OR on 05/26/18. Patient will watch for the Pre-admit Testing appointment on MyChart and understands this will be by phone. Patient is aware she my [facility] and [facility]. Patient confirmed [employer], and secondary Medicaid FPW, and is aware FPW will not pay. Ext given.

## 2018-05-23 ENCOUNTER — Other Ambulatory Visit: Payer: Self-pay

## 2018-05-23 ENCOUNTER — Encounter
Admission: RE | Admit: 2018-05-23 | Discharge: 2018-05-23 | Disposition: A | Discharge status: Home / Self Care | Payer: Managed Care, Other (non HMO) | Source: Ambulatory Visit | Attending: Obstetrics and Gynecology | Admitting: Obstetrics and Gynecology

## 2018-05-23 HISTORY — DX: Gastro-esophageal reflux disease without esophagitis: K21.9

## 2018-05-23 HISTORY — DX: Anxiety disorder, unspecified: F41.9

## 2018-05-23 HISTORY — DX: Conduction disorder, unspecified: I45.9

## 2018-05-23 NOTE — Patient Instructions (Signed)
Your procedure is scheduled on: 05-26-18 THURSDAY Report to Same Day Surgery 2nd floor medical mall [facility](Medical Mall Entrance-take elevator on left to 2nd floor.  Check in with surgery information desk.) To find out your arrival time please call [phone] between 1PM - 3PM on [facility]  Remember: Instructions that are not followed completely may result in serious medical risk, up to and including death, or upon the discretion of your surgeon and anesthesiologist your surgery may need to be rescheduled.    _x___ 1. Do not eat food after midnight the night before your procedure. NO GUM OR CANDY AFTER MIDNIGHT.  You may drink clear liquids up to 2 hours before you are scheduled to arrive at the hospital for your procedure.  Do not drink clear liquids within 2 hours of your scheduled arrival to the hospital.  Clear liquids include  --Water or Apple juice without pulp  --Clear carbohydrate beverage such as ClearFast or Gatorade  --Black Coffee or Clear Tea (No milk, no creamers, do not add anything to the coffee or Tea     __x__ 2. No Alcohol for 24 hours before or after surgery.   __x__3. No Smoking or e-cigarettes for 24 prior to surgery.  Do not use any chewable tobacco products for at least 6 hour prior to surgery   ____  4. Bring all medications with you on the day of surgery if instructed.    __x__ 5. Notify your doctor if there is any change in your medical condition     (cold, fever, infections).    x___6. On the morning of surgery brush your teeth with toothpaste and water.  You may rinse your mouth with mouth wash if you wish.  Do not swallow any toothpaste or mouthwash.   Do not wear jewelry, make-up, hairpins, clips or nail polish.  Do not wear lotions, powders, or perfumes. You may wear deodorant.  Do not shave 48 hours prior to surgery. Men may shave face and neck.  Do not bring valuables to the hospital.    [facility] any belongings or  valuables.               Contacts, dentures or bridgework may not be worn into surgery.  Leave your suitcase in the car. After surgery it may be brought to your room.  For patients admitted to the hospital, discharge time is determined by your treatment team.  _  Patients discharged the day of surgery will not be allowed to drive home.  You will need someone to drive you home and stay with you the night of your procedure.    Please read over the following fact sheets that you were given:   [facility]   ____ Take anti-hypertensive listed below, cardiac, seizure, asthma, anti-reflux and psychiatric medicines. These include:  1. NONE  2.  3.  4.  5.  6.  ____Fleets enema or Magnesium Citrate as directed.   _x___ Use CHG Soap or sage wipes as directed on instruction sheet   ____ Use inhalers on the day of surgery and bring to hospital day of surgery  ____ Stop Metformin and Janumet 2 days prior to surgery.    ____ Take 1/2 of usual insulin dose the night before surgery and none on the morning surgery.   ____ Follow recommendations from Cardiologist, Pulmonologist or PCP regarding stopping Aspirin, Coumadin, Plavix ,Eliquis, Effient, or Pradaxa, and Pletal.  X____Stop Anti-inflammatories such as Advil,  Aleve, Ibuprofen, Motrin, Naproxen, Naprosyn, Goodies powders or aspirin products NOW-OK to take Tylenol    ____ Stop supplements until after surgery.    ____ Bring C-Pap to the hospital.

## 2018-05-25 ENCOUNTER — Telehealth: Payer: Self-pay

## 2018-05-25 ENCOUNTER — Encounter
Admission: RE | Admit: 2018-05-25 | Discharge: 2018-05-25 | Disposition: A | Discharge status: Home / Self Care | Payer: Managed Care, Other (non HMO) | Source: Ambulatory Visit | Attending: Obstetrics and Gynecology | Admitting: Obstetrics and Gynecology

## 2018-05-25 DIAGNOSIS — N83201 Unspecified ovarian cyst, right side: Secondary | ICD-10-CM | POA: Diagnosis present

## 2018-05-25 DIAGNOSIS — J45909 Unspecified asthma, uncomplicated: Secondary | ICD-10-CM | POA: Diagnosis not present

## 2018-05-25 DIAGNOSIS — Z79899 Other long term (current) drug therapy: Secondary | ICD-10-CM | POA: Diagnosis not present

## 2018-05-25 DIAGNOSIS — F17210 Nicotine dependence, cigarettes, uncomplicated: Secondary | ICD-10-CM | POA: Diagnosis not present

## 2018-05-25 DIAGNOSIS — N8301 Follicular cyst of right ovary: Secondary | ICD-10-CM | POA: Diagnosis not present

## 2018-05-25 LAB — CBC
HCT: 39.4 % (ref 35.0–47.0)
HEMOGLOBIN: 13.3 g/dL (ref 12.0–16.0)
MCH: 29.1 pg (ref 26.0–34.0)
MCHC: 33.7 g/dL (ref 32.0–36.0)
MCV: 86.3 fL (ref 80.0–100.0)
PLATELETS: 251 10*3/uL (ref 150–440)
RBC: 4.57 MIL/uL (ref 3.80–5.20)
RDW: 13.8 % (ref 11.5–14.5)
WBC: 8.4 10*3/uL (ref 3.6–11.0)

## 2018-05-25 LAB — TYPE AND SCREEN
ABO/RH(D): O POS
Antibody Screen: NEGATIVE

## 2018-05-25 NOTE — Telephone Encounter (Signed)
FMLA/DISABILITY form for [employer] filled, signature obtained, and given to [place] for processing.

## 2018-05-26 ENCOUNTER — Ambulatory Visit
Admission: RE | Admit: 2018-05-26 | Discharge: 2018-05-26 | Disposition: A | Discharge status: Home / Self Care | Payer: Managed Care, Other (non HMO) | Source: Ambulatory Visit | Attending: Obstetrics and Gynecology | Admitting: Obstetrics and Gynecology

## 2018-05-26 ENCOUNTER — Ambulatory Visit: Payer: Managed Care, Other (non HMO) | Admitting: Anesthesiology

## 2018-05-26 ENCOUNTER — Encounter
Admission: RE | Disposition: A | Discharge status: Home / Self Care | Payer: Self-pay | Source: Ambulatory Visit | Attending: Obstetrics and Gynecology

## 2018-05-26 DIAGNOSIS — N8301 Follicular cyst of right ovary: Secondary | ICD-10-CM | POA: Insufficient documentation

## 2018-05-26 DIAGNOSIS — J45909 Unspecified asthma, uncomplicated: Secondary | ICD-10-CM | POA: Insufficient documentation

## 2018-05-26 DIAGNOSIS — Z79899 Other long term (current) drug therapy: Secondary | ICD-10-CM | POA: Insufficient documentation

## 2018-05-26 DIAGNOSIS — F17210 Nicotine dependence, cigarettes, uncomplicated: Secondary | ICD-10-CM | POA: Insufficient documentation

## 2018-05-26 HISTORY — PX: LAPAROSCOPIC OVARIAN CYSTECTOMY: SHX6248

## 2018-05-26 LAB — URINE DRUG SCREEN, QUALITATIVE (ARMC ONLY)
AMPHETAMINES, UR SCREEN: NOT DETECTED
Benzodiazepine, Ur Scrn: NOT DETECTED
CANNABINOID 50 NG, UR ~~LOC~~: POSITIVE — AB
Cocaine Metabolite,Ur ~~LOC~~: NOT DETECTED
MDMA (Ecstasy)Ur Screen: NOT DETECTED
METHADONE SCREEN, URINE: NOT DETECTED
Opiate, Ur Screen: NOT DETECTED
Phencyclidine (PCP) Ur S: NOT DETECTED
Tricyclic, Ur Screen: NOT DETECTED

## 2018-05-26 LAB — POCT PREGNANCY, URINE: Preg Test, Ur: NEGATIVE

## 2018-05-26 LAB — ABO/RH: ABO/RH(D): O POS

## 2018-05-26 SURGERY — EXCISION, CYST, OVARY, LAPAROSCOPIC
Anesthesia: General | Laterality: Right | Wound class: Clean Contaminated

## 2018-05-26 MED ORDER — ROCURONIUM BROMIDE 100 MG/10ML IV SOLN
INTRAVENOUS | Status: DC | PRN
  Administered 2018-05-26: 5 mg via INTRAVENOUS
  Administered 2018-05-26: 25 mg via INTRAVENOUS

## 2018-05-26 MED ORDER — ACETAMINOPHEN 10 MG/ML IV SOLN
INTRAVENOUS | Status: DC | PRN
  Administered 2018-05-26: 1000 mg via INTRAVENOUS

## 2018-05-26 MED ORDER — LIDOCAINE HCL (PF) 2 % IJ SOLN
INTRAMUSCULAR | Status: AC
  Filled 2018-05-26: qty 10

## 2018-05-26 MED ORDER — SEVOFLURANE IN SOLN
RESPIRATORY_TRACT | Status: AC
  Filled 2018-05-26: qty 250

## 2018-05-26 MED ORDER — FENTANYL CITRATE (PF) 100 MCG/2ML IJ SOLN
INTRAMUSCULAR | Status: DC | PRN
  Administered 2018-05-26 (×2): 50 ug via INTRAVENOUS

## 2018-05-26 MED ORDER — GLYCOPYRROLATE 0.2 MG/ML IJ SOLN
INTRAMUSCULAR | Status: AC
  Filled 2018-05-26: qty 1

## 2018-05-26 MED ORDER — PROPOFOL 10 MG/ML IV BOLUS
INTRAVENOUS | Status: AC
  Filled 2018-05-26: qty 20

## 2018-05-26 MED ORDER — PENTAFLUOROPROP-TETRAFLUOROETH EX AERO
INHALATION_SPRAY | CUTANEOUS | Status: AC
  Filled 2018-05-26: qty 103.5

## 2018-05-26 MED ORDER — IBUPROFEN 800 MG PO TABS: 800.0000 mg | ORAL_TABLET | Freq: Three times a day (TID) | ORAL | 0 refills | Status: DC | PRN

## 2018-05-26 MED ORDER — MIDAZOLAM HCL 2 MG/2ML IJ SOLN
INTRAMUSCULAR | Status: DC | PRN
  Administered 2018-05-26: 2 mg via INTRAVENOUS

## 2018-05-26 MED ORDER — ONDANSETRON HCL 4 MG/2ML IJ SOLN
INTRAMUSCULAR | Status: DC | PRN
  Administered 2018-05-26: 4 mg via INTRAVENOUS

## 2018-05-26 MED ORDER — HYDROCODONE-ACETAMINOPHEN 5-325 MG PO TABS: 1.0000 | ORAL_TABLET | Freq: Four times a day (QID) | ORAL | 0 refills | Status: DC | PRN

## 2018-05-26 MED ORDER — FAMOTIDINE 20 MG PO TABS
ORAL_TABLET | ORAL | Status: AC
  Administered 2018-05-26: 20 mg via ORAL
  Filled 2018-05-26: qty 1

## 2018-05-26 MED ORDER — KETOROLAC TROMETHAMINE 30 MG/ML IJ SOLN
INTRAMUSCULAR | Status: DC | PRN
  Administered 2018-05-26: 30 mg via INTRAVENOUS

## 2018-05-26 MED ORDER — PROPOFOL 10 MG/ML IV BOLUS
INTRAVENOUS | Status: DC | PRN
  Administered 2018-05-26: 200 mg via INTRAVENOUS

## 2018-05-26 MED ORDER — LIDOCAINE HCL (CARDIAC) PF 100 MG/5ML IV SOSY
PREFILLED_SYRINGE | INTRAVENOUS | Status: DC | PRN
Start: 2018-05-26 — End: 2018-05-26
  Administered 2018-05-26: 100 mg via INTRAVENOUS

## 2018-05-26 MED ORDER — FENTANYL CITRATE (PF) 100 MCG/2ML IJ SOLN
INTRAMUSCULAR | Status: AC
Start: 2018-05-26 — End: ?
  Filled 2018-05-26: qty 2

## 2018-05-26 MED ORDER — BUPIVACAINE HCL 0.5 % IJ SOLN
INTRAMUSCULAR | Status: DC | PRN
  Administered 2018-05-26: 19 mL

## 2018-05-26 MED ORDER — SUCCINYLCHOLINE CHLORIDE 20 MG/ML IJ SOLN
INTRAMUSCULAR | Status: DC | PRN
  Administered 2018-05-26: 140 mg via INTRAVENOUS

## 2018-05-26 MED ORDER — FAMOTIDINE 20 MG PO TABS
20.0000 mg | ORAL_TABLET | Freq: Once | ORAL | Status: AC
  Administered 2018-05-26: 20 mg via ORAL

## 2018-05-26 MED ORDER — SUCCINYLCHOLINE CHLORIDE 20 MG/ML IJ SOLN
INTRAMUSCULAR | Status: AC
  Filled 2018-05-26: qty 1

## 2018-05-26 MED ORDER — ONDANSETRON HCL 4 MG/2ML IJ SOLN
INTRAMUSCULAR | Status: AC
  Filled 2018-05-26: qty 2

## 2018-05-26 MED ORDER — DEXAMETHASONE SODIUM PHOSPHATE 10 MG/ML IJ SOLN
INTRAMUSCULAR | Status: AC
  Filled 2018-05-26: qty 1

## 2018-05-26 MED ORDER — LACTATED RINGERS IV SOLN: INTRAVENOUS | Status: DC

## 2018-05-26 MED ORDER — SUGAMMADEX SODIUM 500 MG/5ML IV SOLN
INTRAVENOUS | Status: AC
  Filled 2018-05-26: qty 5

## 2018-05-26 MED ORDER — EPHEDRINE SULFATE 50 MG/ML IJ SOLN
INTRAMUSCULAR | Status: AC
  Filled 2018-05-26: qty 1

## 2018-05-26 MED ORDER — BUPIVACAINE HCL (PF) 0.5 % IJ SOLN
INTRAMUSCULAR | Status: AC
  Filled 2018-05-26: qty 30

## 2018-05-26 MED ORDER — ACETAMINOPHEN NICU IV SYRINGE 10 MG/ML
INTRAVENOUS | Status: AC
  Filled 2018-05-26: qty 1

## 2018-05-26 MED ORDER — FENTANYL CITRATE (PF) 100 MCG/2ML IJ SOLN: 25.0000 ug | INTRAMUSCULAR | Status: DC | PRN

## 2018-05-26 MED ORDER — OXYCODONE HCL 5 MG/5ML PO SOLN: 5.0000 mg | Freq: Once | ORAL | Status: DC | PRN

## 2018-05-26 MED ORDER — LACTATED RINGERS IV SOLN
INTRAVENOUS | Status: DC
  Administered 2018-05-26: 07:00:00 via INTRAVENOUS

## 2018-05-26 MED ORDER — MIDAZOLAM HCL 2 MG/2ML IJ SOLN
INTRAMUSCULAR | Status: AC
Start: 2018-05-26 — End: ?
  Filled 2018-05-26: qty 2

## 2018-05-26 MED ORDER — OXYCODONE HCL 5 MG PO TABS: 5.0000 mg | ORAL_TABLET | Freq: Once | ORAL | Status: DC | PRN

## 2018-05-26 MED ORDER — SUGAMMADEX SODIUM 200 MG/2ML IV SOLN
INTRAVENOUS | Status: DC | PRN
  Administered 2018-05-26: 300 mg via INTRAVENOUS

## 2018-05-26 MED ORDER — DEXAMETHASONE SODIUM PHOSPHATE 10 MG/ML IJ SOLN
INTRAMUSCULAR | Status: DC | PRN
  Administered 2018-05-26: 10 mg via INTRAVENOUS

## 2018-05-26 MED ORDER — KETOROLAC TROMETHAMINE 30 MG/ML IJ SOLN
INTRAMUSCULAR | Status: AC
  Filled 2018-05-26: qty 1

## 2018-05-26 MED ORDER — ROCURONIUM BROMIDE 50 MG/5ML IV SOLN
INTRAVENOUS | Status: AC
Start: 2018-05-26 — End: ?
  Filled 2018-05-26: qty 1

## 2018-05-26 MED ORDER — PHENYLEPHRINE HCL 10 MG/ML IJ SOLN
INTRAMUSCULAR | Status: AC
  Filled 2018-05-26: qty 1

## 2018-05-26 MED ORDER — GLYCOPYRROLATE 0.2 MG/ML IJ SOLN
INTRAMUSCULAR | Status: DC | PRN
  Administered 2018-05-26: 0.2 mg via INTRAVENOUS

## 2018-05-26 SURGICAL SUPPLY — 39 items
ANCHOR TIS RET SYS 235ML (MISCELLANEOUS) ×3 IMPLANT
BAG COUNTER SPONGE EZ (MISCELLANEOUS) ×2 IMPLANT
BLADE SURG SZ11 CARB STEEL (BLADE) ×3 IMPLANT
CANISTER SUCT 1200ML W/VALVE (MISCELLANEOUS) ×3 IMPLANT
CATH FOLEY 2WAY SIL 16X30 (CATHETERS) ×3 IMPLANT
CATH ROBINSON RED A/P 16FR (CATHETERS) IMPLANT
CHLORAPREP W/TINT 26ML (MISCELLANEOUS) ×3 IMPLANT
COUNTER SPONGE BAG EZ (MISCELLANEOUS) ×1
DERMABOND ADVANCED (GAUZE/BANDAGES/DRESSINGS) ×2
DERMABOND ADVANCED .7 DNX12 (GAUZE/BANDAGES/DRESSINGS) ×1 IMPLANT
DRSG TEGADERM 2-3/8X2-3/4 SM (GAUZE/BANDAGES/DRESSINGS) ×9 IMPLANT
GLOVE SKINSENSE NS SZ6.5 (GLOVE) ×2
GLOVE SKINSENSE STRL SZ6.5 (GLOVE) ×1 IMPLANT
GLOVE SURG SYN 6.5 ES PF (GLOVE) ×3 IMPLANT
GOWN STRL REUS W/ TWL LRG LVL3 (GOWN DISPOSABLE) ×2 IMPLANT
GOWN STRL REUS W/TWL LRG LVL3 (GOWN DISPOSABLE) ×4
GRASPER SUT TROCAR 14GX15 (MISCELLANEOUS) ×3 IMPLANT
IRRIGATION STRYKERFLOW (MISCELLANEOUS) ×2 IMPLANT
IRRIGATOR STRYKERFLOW (MISCELLANEOUS) ×6
IV LACTATED RINGERS 1000ML (IV SOLUTION) ×3 IMPLANT
LABEL OR SOLS (LABEL) ×3 IMPLANT
LIGASURE VESSEL 5MM BLUNT TIP (ELECTROSURGICAL) ×3 IMPLANT
NEEDLE VERESS 14GA 120MM (NEEDLE) ×3 IMPLANT
NS IRRIG 500ML POUR BTL (IV SOLUTION) ×3 IMPLANT
PACK GYN LAPAROSCOPIC (MISCELLANEOUS) ×3 IMPLANT
PAD OB MATERNITY 4.3X12.25 (PERSONAL CARE ITEMS) ×3 IMPLANT
PAD PREP 24X41 OB/GYN DISP (PERSONAL CARE ITEMS) ×3 IMPLANT
SCISSORS METZENBAUM CVD 33 (INSTRUMENTS) IMPLANT
SHEARS HARMONIC ACE PLUS 36CM (ENDOMECHANICALS) ×3 IMPLANT
SLEEVE ENDOPATH XCEL 5M (ENDOMECHANICALS) ×6 IMPLANT
SPONGE GAUZE 2X2 8PLY STER LF (GAUZE/BANDAGES/DRESSINGS) ×2
SPONGE GAUZE 2X2 8PLY STRL LF (GAUZE/BANDAGES/DRESSINGS) ×4 IMPLANT
SUT VIC AB 2-0 UR6 27 (SUTURE) ×3 IMPLANT
SUT VIC AB 4-0 FS2 27 (SUTURE) ×6 IMPLANT
SUT VIC AB 4-0 PS2 18 (SUTURE) ×6 IMPLANT
SYR 10ML LL (SYRINGE) ×6 IMPLANT
TROCAR ENDO BLADELESS 11MM (ENDOMECHANICALS) IMPLANT
TROCAR XCEL NON-BLD 5MMX100MML (ENDOMECHANICALS) ×3 IMPLANT
TUBING INSUFFLATION (TUBING) ×3 IMPLANT

## 2018-05-26 NOTE — Anesthesia Procedure Notes (Signed)
Procedure Name: Intubation Date/Time: 05/26/2018 7:44 AM Performed by: Ginger CarneMichelet, Shrihaan Porzio, CRNA Pre-anesthesia Checklist: Patient identified, Emergency Drugs available, Suction available, Timeout performed and Patient being monitored Patient Re-evaluated:Patient Re-evaluated prior to induction Oxygen Delivery Method: Circle system utilized Preoxygenation: Pre-oxygenation with 100% oxygen Induction Type: IV induction Ventilation: Mask ventilation without difficulty and Oral airway inserted - appropriate to patient size Laryngoscope Size: McGraph and 3 Tube type: Oral Tube size: 7.5 mm Number of attempts: 1 Airway Equipment and Method: Stylet and Video-laryngoscopy Placement Confirmation: ETT inserted through vocal cords under direct vision,  positive ETCO2 and breath sounds checked- equal and bilateral Secured at: 22 cm Tube secured with: Tape Dental Injury: Teeth and Oropharynx as per pre-operative assessment  Difficulty Due To: Difficulty was anticipated, Difficult Airway- due to large tongue, Difficult Airway- due to anterior larynx and Difficult Airway- due to limited oral opening

## 2018-05-26 NOTE — Discharge Instructions (Signed)
Laparoscopy, Care After Refer to this sheet in the next few weeks. These instructions provide you with information about caring for yourself after your procedure. Your health care provider may also give you more specific instructions. Your treatment has been planned according to current medical practices, but problems sometimes occur. Call your health care provider if you have any problems or questions after your procedure. What can I expect after the procedure? After your procedure, it is common to have mild discomfort in the throat and abdomen. Follow these instructions at home:  Take over-the-counter and prescription medicines only as told by your health care provider.  Do not drive for 24 hours if you received a sedative.  Return to your normal activities as told by your health care provider.  Do not take baths, swim, or use a hot tub until your health care provider approves. You may shower.  Follow instructions from your health care provider about how to take care of your incision. Make sure you: ? Wash your hands with soap and water before you change your bandage (dressing). If soap and water are not available, use hand sanitizer. ? Change your dressing as told by your health care provider. ? Leave stitches (sutures), skin glue, or adhesive strips in place. These skin closures may need to stay in place for 2 weeks or longer. If adhesive strip edges start to loosen and curl up, you may trim the loose edges. Do not remove adhesive strips completely unless your health care provider tells you to do that.  Check your incision area every day for signs of infection. Check for: ? More redness, swelling, or pain. ? More fluid or blood. ? Warmth. ? Pus or a bad smell.  It is your responsibility to get the results of your procedure. Ask your health care provider or the department performing the procedure when your results will be ready. Contact a health care provider if:  There is new pain in  your shoulders.  You feel light-headed or faint.  You are unable to pass gas or unable to have a bowel movement.  You feel nauseous or you vomit.  You develop a rash.  You have more redness, swelling, or pain around your incision.  You have more fluid or blood coming from your incision.  Your incision feels warm to the touch.  You have pus or a bad smell coming from your incision.  You have a fever or chills. Get help right away if:  Your pain is getting worse.  You have ongoing vomiting.  The edges of your incision open up.  You have trouble breathing.  You have chest pain. This information is not intended to replace advice given to you by your health care provider. Make sure you discuss any questions you have with your health care provider. Document Released: 11/04/2015 Document Revised: 04/30/2016 Document Reviewed: 08/06/2015 Elsevier Interactive Patient Education  2018 Elsevier Inc.  AMBULATORY SURGERY  DISCHARGE INSTRUCTIONS   1) The drugs that you were given will stay in your system until tomorrow so for the next 24 hours you should not:  A) Drive an automobile B) Make any legal decisions C) Drink any alcoholic beverage   2) You may resume regular meals tomorrow.  Today it is better to start with liquids and gradually work up to solid foods.  You may eat anything you prefer, but it is better to start with liquids, then soup and crackers, and gradually work up to solid foods.   3) Please  notify your doctor immediately if you have any unusual bleeding, trouble breathing, redness and pain at the surgery site, drainage, fever, or pain not relieved by medication.    4) Additional Instructions:        Please contact your physician with any problems or Same Day Surgery at [phone], Monday through Friday 6 am to 4 pm, or [place] at [facility] number at [phone].

## 2018-05-26 NOTE — Progress Notes (Signed)
Heart rate in low 50's at times   Pt stable   Dr piscitello aware  No new orders

## 2018-05-26 NOTE — Anesthesia Preprocedure Evaluation (Signed)
Anesthesia Evaluation  Patient identified by MRN, date of birth, ID band Patient awake    Reviewed: Allergy & Precautions, H&P , NPO status , Patient's Chart, lab work & pertinent test results  History of Anesthesia Complications Negative for: history of anesthetic complications  Airway Mallampati: III  TM Distance: >3 FB Neck ROM: full    Dental  (+) Chipped, Poor Dentition   Pulmonary neg shortness of breath, asthma , Current Smoker,  Signs and symptoms suggestive of sleep apnea            Cardiovascular Exercise Tolerance: Good (-) angina(-) Past MI and (-) DOE negative cardio ROS       Neuro/Psych PSYCHIATRIC DISORDERS Anxiety negative neurological ROS     GI/Hepatic Neg liver ROS, GERD  Medicated and Controlled,  Endo/Other  negative endocrine ROS  Renal/GU      Musculoskeletal   Abdominal   Peds  Hematology negative hematology ROS (+)   Anesthesia Other Findings Past Medical History: No date: Anxiety     Comment:  no meds No date: Asthma     Comment:  h/o no inhalers No date: GERD (gastroesophageal reflux disease)     Comment:  no meds No date: History of itching No date: Pyelonephritis No date: Skipped heart beats     Comment:  pt states this happens occ but is asymptomatic  Past Surgical History: No date: CESAREAN SECTION     Reproductive/Obstetrics negative OB ROS                             Anesthesia Physical Anesthesia Plan  ASA: III  Anesthesia Plan: General ETT   Post-op Pain Management:    Induction: Intravenous  PONV Risk Score and Plan: Ondansetron, Dexamethasone and Midazolam  Airway Management Planned: Oral ETT  Additional Equipment:   Intra-op Plan:   Post-operative Plan: Extubation in OR  Informed Consent: I have reviewed the patients History and Physical, chart, labs and discussed the procedure including the risks, benefits and  alternatives for the proposed anesthesia with the patient or authorized representative who has indicated his/her understanding and acceptance.   Dental Advisory Given  Plan Discussed with: Anesthesiologist, CRNA and Surgeon  Anesthesia Plan Comments: (Patient consented for risks of anesthesia including but not limited to:  - adverse reactions to medications - damage to teeth, lips or other oral mucosa - sore throat or hoarseness - Damage to heart, brain, lungs or loss of life  Patient voiced understanding.)        Anesthesia Quick Evaluation

## 2018-05-26 NOTE — Anesthesia Post-op Follow-up Note (Signed)
Anesthesia QCDR form completed.        

## 2018-05-26 NOTE — Op Note (Signed)
  Operative Note   05/26/2018  PRE-OP DIAGNOSIS: Right ovarian cyst    POST-OP DIAGNOSIS: same   PROCEDURE: Procedure(s): LAPAROSCOPIC RIGHT OVARIAN CYSTECTOMY   SURGEON: Kaelon Weekes MD   ANESTHESIA: General  ESTIMATED BLOOD LOSS: 5 cc  COMPLICATIONS: None  DISPOSITION: PACU - hemodynamically stable.  CONDITION: stable  FINDINGS: Laparoscopic survey of the abdomen revealed a grossly normal uterus, tubes, liver edge, and appendix. Gallbladder edge not visualized. Small intra-abdominal adhesions were noted between the anterior surface of the uterus and the abdominal wall.  Likely normal postoperative scarring from her cesarean section. Left ovary grossly normal. Right ovary enlarged with simple ovarian cyst.   PROCEDURE IN DETAIL: The patient was taken to the OR where anesthesia was administed. The patient was positioned in dorsal lithotomy in the [place] stirrups. The patient was then examined under anesthesia with the above noted findings. The patient was prepped and draped in the normal sterile fashion and foley catheter was placed. A weighted speculum was placed in the vagina and the anterior lip of the cervix was grasped with a single toothed tenaculum. A Hulka uterine manipulator was then inserted in the uterus. Uterine mobility was found to be satisfactory. The speculum was then removed.  Attention was turned to the patient's abdomen where a 5 mm skin incision was made in the umbilical fold, after injection of local anesthesia. The 5mm trocar was introduced under direct visualization using the operative laparoscope into the abdominal cavity. Pneumoperitoneum was obtained.  Trendelenburg positioning. Diagnostic laparoscopy was performed. The right ovarian cyst was visualized.   Additional 5 mm trocar was then placed in the RLQ lateral to the inferior epigastric blood vessels under direct visualization with the laparoscope. The procedure was then repeated on the Left lower  quadrant. Instrumentation to visualize complete pelvic anatomy performed.  The right ovarian cyst was ruptured and drained of fluid. The cyst wall was removed with the harmonic scalpel. The edges were noted to be hemostatic. The cyst wall was sent to pathology.  The pelvic was suction irrigated. No adhesions were noted in the posterior cul de sac. There were no signs of endometriosis or extensive adhesions.  The procedure was completed.  Instruments and trocars removed, gas expelled, and skin closed with 4-0 vicryl and skin adhesive glue.  Instrument, needle, and sponge counts correct x2 at the conclusion of the case. All instruments removed from the vagina. Hulka sites hemostatic.  Pt goes to recovery room in stable condition.  Adelene Idlerhristanna Lattie Cervi MD [facility], [facility] 05/26/18  9:19 AM

## 2018-05-26 NOTE — Anesthesia Postprocedure Evaluation (Signed)
Anesthesia Post Note  Patient: Samantha Villegas  Procedure(s) Performed: LAPAROSCOPIC RIGHT OVARIAN CYSTECTOMY (Right )  Patient location during evaluation: PACU Anesthesia Type: General Level of consciousness: awake and alert Pain management: pain level controlled Vital Signs Assessment: post-procedure vital signs reviewed and stable Respiratory status: spontaneous breathing, nonlabored ventilation, respiratory function stable and patient connected to nasal cannula oxygen Cardiovascular status: blood pressure returned to baseline and stable Postop Assessment: no apparent nausea or vomiting Anesthetic complications: no     Last Vitals:  Vitals:   05/26/18 1010 05/26/18 1046  BP: 122/71 125/76  Pulse: (!) 53 65  Resp: 14 16  Temp: 36.5 C   SpO2: 100% 99%    Last Pain:  Vitals:   05/26/18 1010  TempSrc: Oral  PainSc: 0-No pain                 Cleda MccreedyJoseph K Piscitello

## 2018-05-26 NOTE — Transfer of Care (Signed)
Immediate Anesthesia Transfer of Care Note  Patient: Barrie DunkerVanessa Taneisha Buttermore  Procedure(s) Performed: LAPAROSCOPIC RIGHT OVARIAN CYSTECTOMY (Right )  Patient Location: PACU  Anesthesia Type:General  Level of Consciousness: awake, alert  and drowsy  Airway & Oxygen Therapy: Patient Spontanous Breathing and Patient connected to face mask oxygen  Post-op Assessment: Report given to RN and Post -op Vital signs reviewed and stable  Post vital signs: Reviewed and stable  Last Vitals:  Vitals Value Taken Time  BP 124/67 05/26/2018  9:21 AM  Temp 36.8 C 05/26/2018  9:21 AM  Pulse 63 05/26/2018  9:21 AM  Resp 14 05/26/2018  9:21 AM  SpO2 100 % 05/26/2018  9:21 AM  Vitals shown include unvalidated device data.  Last Pain:  Vitals:   05/26/18 0921  TempSrc: Temporal  PainSc:          Complications: No apparent anesthesia complications

## 2018-05-26 NOTE — Interval H&P Note (Signed)
History and Physical Interval Note:  05/26/2018 7:18 AM  Barrie DunkerVanessa Taneisha Corriveau  has presented today for surgery, with the diagnosis of OVARIAN CYST  The various methods of treatment have been discussed with the patient and family. After consideration of risks, benefits and other options for treatment, the patient has consented to  Procedure(s): LAPAROSCOPIC RIGHT OVARIAN CYSTECTOMY, POSSIBLE RIGHT OOPHORECTOMY (Right) as a surgical intervention .  The patient's history has been reviewed, patient examined, no change in status, stable for surgery.  I have reviewed the patient's chart and labs.  Questions were answered to the patient's satisfaction.     Desean Heemstra R Melchizedek Espinola

## 2018-05-27 ENCOUNTER — Encounter: Payer: Self-pay | Admitting: Obstetrics and Gynecology

## 2018-05-27 ENCOUNTER — Telehealth: Payer: Self-pay

## 2018-05-27 NOTE — Telephone Encounter (Signed)
[name] calling from [employer] regarding a return to work date. The dates on from is not same as when she actually returned. CB# [phone] Claim # [id number]

## 2018-05-27 NOTE — Telephone Encounter (Signed)
Questions answered.

## 2018-05-30 LAB — SURGICAL PATHOLOGY

## 2018-06-01 ENCOUNTER — Telehealth: Payer: Self-pay

## 2018-06-01 NOTE — Telephone Encounter (Signed)
FMLA/DISABILITY additional info filled out for [employer], signature obtained, and given to [place] for processing.

## 2018-06-02 ENCOUNTER — Ambulatory Visit (INDEPENDENT_AMBULATORY_CARE_PROVIDER_SITE_OTHER): Payer: Managed Care, Other (non HMO) | Admitting: Obstetrics and Gynecology

## 2018-06-02 ENCOUNTER — Encounter: Payer: Self-pay | Admitting: Obstetrics and Gynecology

## 2018-06-02 VITALS — BP 120/80 | HR 78 | Ht 66.0 in | Wt 282.0 lb

## 2018-06-02 DIAGNOSIS — Z9889 Other specified postprocedural states: Secondary | ICD-10-CM

## 2018-06-02 DIAGNOSIS — N761 Subacute and chronic vaginitis: Secondary | ICD-10-CM

## 2018-06-02 DIAGNOSIS — N83201 Unspecified ovarian cyst, right side: Secondary | ICD-10-CM

## 2018-06-02 NOTE — Patient Instructions (Signed)
Lactobacillus

## 2018-06-02 NOTE — Progress Notes (Signed)
Patient ID: Samantha Villegas Mensch, female   DOB: 06/10/1991, 27 y.o.   MRN: [id number]  Reason for Consult: Post-op Follow-up (ovarian cyst removal)   Referred by Particia NearingLane, Rachel Elizabeth,*  Subjective:     HPI:  Samantha Villegas Madero is a 27 y.o. female she is one week out from a laparoscopic ovarian cystectomy. She is feeling well, mild right side ache. Pain is greatly improved, she is not taking any pain medication. Incisions are healing well. She was being treated before the surgery for bacterial vaginosis and she feels like the symptoms have returned. She would like to be tested for the infection today. She also began having loose stools again.   Past Medical History:  Diagnosis Date  . Anxiety    no meds  . Asthma    h/o no inhalers  . GERD (gastroesophageal reflux disease)    no meds  . History of itching   . Pyelonephritis   . Skipped heart beats    pt states this happens occ but is asymptomatic   Family History  Problem Relation Age of Onset  . Arthritis Mother   . Asthma Mother   . Hyperlipidemia Mother   . Hypertension Mother   . Stroke Mother   . Ulcers Mother   . Drug abuse Brother   . Early death Brother   . Cancer Maternal Grandmother   . Heart disease Paternal Grandmother   . Diabetes Paternal Aunt    Past Surgical History:  Procedure Laterality Date  . CESAREAN SECTION    . LAPAROSCOPIC OVARIAN CYSTECTOMY Right 05/26/2018   Procedure: LAPAROSCOPIC RIGHT OVARIAN CYSTECTOMY;  Surgeon: Natale MilchSchuman, Makayla Confer R, MD;  Location: [facility] ORS;  Service: Gynecology;  Laterality: Right;    Short Social History:  Social History   Tobacco Use  . Smoking status: Current Every Day Smoker    Packs/day: 1.00    Years: 10.00    Pack years: 10.00    Types: Cigarettes  . Smokeless tobacco: Never Used  Substance Use Topics  . Alcohol use: Yes    Comment: social    Allergies  Allergen Reactions  . Latex Other (See Comments)    With latex condoms--uti/bacterial  infections.    Current Outpatient Medications  Medication Sig Dispense Refill  . acetaminophen (TYLENOL) 500 MG tablet Take 1,000 mg by mouth every 6 (six) hours as needed (for pain.).    Marland Kitchen. fluticasone (FLONASE) 50 MCG/ACT nasal spray Place 2 sprays into both nostrils 2 (two) times daily. (Patient taking differently: Place 2 sprays into both nostrils 2 (two) times daily as needed for allergies. ) 16 g 11  . HYDROcodone-acetaminophen (NORCO/VICODIN) 5-325 MG tablet Take 1 tablet by mouth every 6 (six) hours as needed for severe pain. 5 tablet 0  . ibuprofen (ADVIL,MOTRIN) 800 MG tablet Take 1 tablet (800 mg total) by mouth every 8 (eight) hours as needed. 30 tablet 0  . montelukast (SINGULAIR) 5 MG chewable tablet Chew 2 tablets (10 mg total) by mouth at bedtime. (Patient taking differently: Chew 10 mg by mouth at bedtime as needed (for allergies.). ) 60 tablet 11  . metroNIDAZOLE (FLAGYL) 500 MG tablet Take 500 mg by mouth 2 (two) times daily.     No current facility-administered medications for this visit.     Review of Systems  Constitutional: Negative for chills, fatigue, fever and unexpected weight change.  HENT: Negative for trouble swallowing.  Eyes: Negative for loss of vision.  Respiratory: Negative for cough, shortness of breath  and wheezing.  Cardiovascular: Negative for chest pain, leg swelling, palpitations and syncope.  GI: Negative for abdominal pain, blood in stool, diarrhea, nausea and vomiting.  GU: Negative for difficulty urinating, dysuria, frequency and hematuria.  Musculoskeletal: Negative for back pain, leg pain and joint pain.  Skin: Negative for rash.  Neurological: Negative for dizziness, headaches, light-headedness, numbness and seizures.  Psychiatric: Negative for behavioral problem, confusion, depressed mood and sleep disturbance.        Objective:  Objective   Vitals:   06/02/18 1159  BP: 120/80  Pulse: 78  Weight: 282 lb (127.9 kg)  Height: 5\' 6"   (1.676 m)   Body mass index is 45.52 kg/m.  Physical Exam  Constitutional: She is oriented to person, place, and time. She appears well-developed and well-nourished.  HENT:  Head: Normocephalic and atraumatic.  Eyes: EOM are normal.  Cardiovascular: Normal rate, regular rhythm and normal heart sounds.  Pulmonary/Chest: Effort normal and breath sounds normal.  Abdominal: Soft. Bowel sounds are normal. She exhibits no distension. There is no tenderness.  Incisions all healing well, no erythema or induration. All incision sites intact  Genitourinary: Vagina normal and uterus normal.  Genitourinary Comments: Normal appearing cervical discharge  Neurological: She is alert and oriented to person, place, and time.  Skin: Skin is warm and dry.  Psychiatric: She has a normal mood and affect. Her behavior is normal. Judgment and thought content normal.  Nursing note and vitals reviewed.       Assessment/Plan:    27 yo s/p laparoscopic ovarian cystectomy No postoperative complications.  Nuswab sent today for persistent vaginal irritation Follow up as needed.   Adelene Idler MD [facility], [facility] 06/02/18 1:33 PM

## 2018-06-03 ENCOUNTER — Emergency Department
Admission: EM | Admit: 2018-06-03 | Discharge: 2018-06-04 | Disposition: A | Discharge status: Home / Self Care | Payer: Managed Care, Other (non HMO) | Attending: Emergency Medicine | Admitting: Emergency Medicine

## 2018-06-03 ENCOUNTER — Encounter: Payer: Self-pay | Admitting: Emergency Medicine

## 2018-06-03 ENCOUNTER — Ambulatory Visit: Payer: Managed Care, Other (non HMO) | Admitting: Gastroenterology

## 2018-06-03 ENCOUNTER — Other Ambulatory Visit: Payer: Self-pay

## 2018-06-03 DIAGNOSIS — F17210 Nicotine dependence, cigarettes, uncomplicated: Secondary | ICD-10-CM | POA: Insufficient documentation

## 2018-06-03 DIAGNOSIS — J45909 Unspecified asthma, uncomplicated: Secondary | ICD-10-CM | POA: Diagnosis not present

## 2018-06-03 DIAGNOSIS — R197 Diarrhea, unspecified: Secondary | ICD-10-CM

## 2018-06-03 LAB — COMPREHENSIVE METABOLIC PANEL
ALBUMIN: 3.9 g/dL (ref 3.5–5.0)
ALT: 37 U/L (ref 0–44)
ANION GAP: 9 (ref 5–15)
AST: 32 U/L (ref 15–41)
Alkaline Phosphatase: 71 U/L (ref 38–126)
BUN: 14 mg/dL (ref 6–20)
CHLORIDE: 108 mmol/L (ref 98–111)
CO2: 19 mmol/L — AB (ref 22–32)
Calcium: 9.2 mg/dL (ref 8.9–10.3)
Creatinine, Ser: 0.62 mg/dL (ref 0.44–1.00)
GFR calc non Af Amer: >60 mL/min (ref >60)
Glucose, Bld: 102 mg/dL — ABNORMAL HIGH (ref 70–99)
Potassium: 4.2 mmol/L (ref 3.5–5.1)
SODIUM: 136 mmol/L (ref 135–145)
Total Bilirubin: 0.2 mg/dL — ABNORMAL LOW (ref 0.3–1.2)
Total Protein: 7.7 g/dL (ref 6.5–8.1)

## 2018-06-03 LAB — URINALYSIS, COMPLETE (UACMP) WITH MICROSCOPIC
Bilirubin Urine: NEGATIVE
GLUCOSE, UA: NEGATIVE mg/dL
KETONES UR: NEGATIVE mg/dL
Nitrite: NEGATIVE
PH: 5 (ref 5.0–8.0)
Protein, ur: NEGATIVE mg/dL
Specific Gravity, Urine: 1.029 (ref 1.005–1.030)

## 2018-06-03 LAB — CBC
HCT: 42 % (ref 35.0–47.0)
HEMOGLOBIN: 14.1 g/dL (ref 12.0–16.0)
MCH: 28.9 pg (ref 26.0–34.0)
MCHC: 33.4 g/dL (ref 32.0–36.0)
MCV: 86.3 fL (ref 80.0–100.0)
Platelets: 289 10*3/uL (ref 150–440)
RBC: 4.87 MIL/uL (ref 3.80–5.20)
RDW: 13.5 % (ref 11.5–14.5)
WBC: 12.2 10*3/uL — ABNORMAL HIGH (ref 3.6–11.0)

## 2018-06-03 LAB — LIPASE, BLOOD: LIPASE: 51 U/L (ref 11–51)

## 2018-06-03 LAB — POCT PREGNANCY, URINE: Preg Test, Ur: NEGATIVE

## 2018-06-03 MED ORDER — SODIUM CHLORIDE 0.9 % IV BOLUS
1000.0000 mL | Freq: Once | INTRAVENOUS | Status: AC
  Administered 2018-06-03: 1000 mL via INTRAVENOUS

## 2018-06-03 NOTE — ED Provider Notes (Signed)
[facility] Emergency Department Provider Note   ____________________________________________   I have reviewed the triage vital signs and the nursing notes.   HISTORY  Chief Complaint Diarrhea   History limited by: Not Limited   HPI Samantha Villegas is a 27 y.o. female who presents to the emergency department today because of concerns for diarrhea.  Is been going on for the past 3 days.  It is frequent.  She describes as watery.  She has not noticed any blood in it.  It is accompanied by some lower abdominal discomfort.  Patient had similar symptoms for a few months although it cleared up after being put on antibiotics.  She did see GI this month for her diarrhea.  She was then more recently treated with antibiotics for bacterial vaginosis.  She states that she has not yet been tested for C. difficile.  In addition of the diarrhea she has found herself to be weak and tired.  Per medical record review patient has a history of diarrhea for which she was seen in the emergency department and by GI  Past Medical History:  Diagnosis Date  . Anxiety    no meds  . Asthma    h/o no inhalers  . GERD (gastroesophageal reflux disease)    no meds  . History of itching   . Pyelonephritis   . Skipped heart beats    pt states this happens occ but is asymptomatic    Patient Active Problem List   Diagnosis Date Noted  . Acute diarrhea 05/11/2018  . Right ovarian cyst 05/11/2018  . Allergic rhinitis 03/20/2018    Past Surgical History:  Procedure Laterality Date  . CESAREAN SECTION    . LAPAROSCOPIC OVARIAN CYSTECTOMY Right 05/26/2018   Procedure: LAPAROSCOPIC RIGHT OVARIAN CYSTECTOMY;  Surgeon: Natale MilchSchuman, Christanna R, MD;  Location: [facility] ORS;  Service: Gynecology;  Laterality: Right;    Prior to Admission medications   Medication Sig Start Date End Date Taking? Authorizing Provider  acetaminophen (TYLENOL) 500 MG tablet Take 1,000 mg by mouth every 6  (six) hours as needed (for pain.).    [provider]  fluticasone (FLONASE) 50 MCG/ACT nasal spray Place 2 sprays into both nostrils 2 (two) times daily. Patient taking differently: Place 2 sprays into both nostrils 2 (two) times daily as needed for allergies.  03/18/18   Particia NearingLane, Rachel Elizabeth, PA-C  HYDROcodone-acetaminophen (NORCO/VICODIN) 5-325 MG tablet Take 1 tablet by mouth every 6 (six) hours as needed for severe pain. 05/26/18   Schuman, Jaquelyn Bitterhristanna R, MD  ibuprofen (ADVIL,MOTRIN) 800 MG tablet Take 1 tablet (800 mg total) by mouth every 8 (eight) hours as needed. 05/26/18   Schuman, Jaquelyn Bitterhristanna R, MD  metroNIDAZOLE (FLAGYL) 500 MG tablet Take 500 mg by mouth 2 (two) times daily.    [provider]  montelukast (SINGULAIR) 5 MG chewable tablet Chew 2 tablets (10 mg total) by mouth at bedtime. Patient taking differently: Chew 10 mg by mouth at bedtime as needed (for allergies.).  03/18/18   Particia NearingLane, Rachel Elizabeth, PA-C    Allergies Latex  Family History  Problem Relation Age of Onset  . Arthritis Mother   . Asthma Mother   . Hyperlipidemia Mother   . Hypertension Mother   . Stroke Mother   . Ulcers Mother   . Drug abuse Brother   . Early death Brother   . Cancer Maternal Grandmother   . Heart disease Paternal Grandmother   . Diabetes Paternal Aunt  Social History Social History   Tobacco Use  . Smoking status: Current Every Day Smoker    Packs/day: 1.00    Years: 10.00    Pack years: 10.00    Types: Cigarettes  . Smokeless tobacco: Never Used  Substance Use Topics  . Alcohol use: Yes    Comment: social  . Drug use: Yes    Types: Marijuana    Comment: everyday    Review of Systems Constitutional: No fever/chills Eyes: No visual changes. ENT: No sore throat. Cardiovascular: Denies chest pain. Respiratory: Denies shortness of breath. Gastrointestinal: Positive for abdominal discomfort and diarrhea.  Genitourinary: Negative for  dysuria. Musculoskeletal: Negative for back pain. Skin: Negative for rash. Neurological: Negative for headaches, focal weakness or numbness.  ____________________________________________   PHYSICAL EXAM:  VITAL SIGNS: ED Triage Vitals  Enc Vitals Group     BP 06/03/18 2021 (!) 142/89     Pulse Rate 06/03/18 2021 86     Resp 06/03/18 2021 16     Temp 06/03/18 2021 98 F (36.7 C)     Temp Source 06/03/18 2021 Oral     SpO2 06/03/18 2021 97 %     Weight 06/03/18 2022 283 lb (128.4 kg)     Height 06/03/18 2022 5\' 6"  (1.676 m)     Head Circumference --      Peak Flow --      Pain Score 06/03/18 2022 8    Constitutional: Alert and oriented.  Eyes: Conjunctivae are normal.  ENT      Head: Normocephalic and atraumatic.      Nose: No congestion/rhinnorhea.      Mouth/Throat: Mucous membranes are moist.      Neck: No stridor. Hematological/Lymphatic/Immunilogical: No cervical lymphadenopathy. Cardiovascular: Normal rate, regular rhythm.  No murmurs, rubs, or gallops.  Respiratory: Normal respiratory effort without tachypnea nor retractions. Breath sounds are clear and equal bilaterally. No wheezes/rales/rhonchi. Gastrointestinal: Soft and non tender. No rebound. No guarding.  Genitourinary: Deferred Musculoskeletal: Normal range of motion in all extremities. No lower extremity edema. Neurologic:  Normal speech and language. No gross focal neurologic deficits are appreciated.  Skin:  Skin is warm, dry and intact. No rash noted. Psychiatric: Mood and affect are normal. Speech and behavior are normal. Patient exhibits appropriate insight and judgment.  ____________________________________________    LABS (pertinent positives/negatives)  Upreg negative UA hazy, negative nitritie. Trace leukocytes. 0-5 wbcs Lipase 51 CMP na 136, k 4.2, glu 102 CBC wbc 12.2, hgb 14.1, plt  289 ____________________________________________   EKG  None  ____________________________________________    RADIOLOGY  None  ____________________________________________   PROCEDURES  Procedures  ____________________________________________   INITIAL IMPRESSION / ASSESSMENT AND PLAN / ED COURSE  Pertinent labs & imaging results that were available during my care of the patient were reviewed by me and considered in my medical decision making (see chart for details).   Patient presented to the emergency department today because of concerns for continued diarrhea.  Patient has recently been put on antibiotics.  Differential would include gastroenteritis, C. difficile, food poisoning, IBS, Crohn's disease amongst other etiologies.  Blood work shows a mild leukocytosis.  Awaiting C. difficile the time of signout.  ____________________________________________   FINAL CLINICAL IMPRESSION(S) / ED DIAGNOSES  Final diagnoses:  Diarrhea, unspecified type     Note: This dictation was prepared with Dragon dictation. Any transcriptional errors that result from this process are unintentional     Phineas Semen, MD 06/03/18 2258

## 2018-06-03 NOTE — ED Triage Notes (Signed)
Pt to ED from home c/o diarrhea x3 days.  States was seen here before for similar and followed up with GI and ABX started.  Water, denies blood.  States pain mainly with needing to have BM, usually gets relief to abd after BM.

## 2018-06-04 MED ORDER — METRONIDAZOLE 500 MG PO TABS: 500.0000 mg | ORAL_TABLET | Freq: Three times a day (TID) | ORAL | 0 refills | Status: AC

## 2018-06-04 NOTE — ED Provider Notes (Signed)
I assumed care of the patient from Dr. Derrill KayGoodman at 11:30 PM.  Patient had no further diarrheal episodes while in the emergency department and as such a stool sample was not able to be obtained.  I spoke with the patient at length regarding symptoms she stated that they have recurred following surgery for an ovarian cyst.  Consider the possibility of C. difficile given recent antibiotic administration.  Patient also states that she recently completed a course of Flagyl secondary to bacterial vaginosis which patient states that she completed on 05/26/2018.  I spoke with the patient at length regarding obtaining a stool sample and following up with Dr. Allegra LaiVanga.   Darci CurrentBrown, Gary N, MD 06/04/18 [phone]

## 2018-06-05 LAB — URINE CULTURE

## 2018-06-07 ENCOUNTER — Telehealth: Payer: Self-pay

## 2018-06-07 NOTE — Telephone Encounter (Signed)
Additional information for Samantha Villegas filled out and given to [place] for processing.

## 2018-06-08 ENCOUNTER — Other Ambulatory Visit: Payer: Self-pay | Admitting: Obstetrics and Gynecology

## 2018-06-08 ENCOUNTER — Telehealth: Payer: Self-pay | Admitting: Gastroenterology

## 2018-06-08 DIAGNOSIS — B3731 Acute candidiasis of vulva and vagina: Secondary | ICD-10-CM

## 2018-06-08 DIAGNOSIS — B373 Candidiasis of vulva and vagina: Secondary | ICD-10-CM

## 2018-06-08 LAB — NUSWAB VAGINITIS PLUS (VG+)
CANDIDA GLABRATA, NAA: POSITIVE — AB
Candida albicans, NAA: NEGATIVE
Chlamydia trachomatis, NAA: NEGATIVE
NEISSERIA GONORRHOEAE, NAA: NEGATIVE
Trich vag by NAA: NEGATIVE

## 2018-06-08 MED ORDER — FLUCONAZOLE 150 MG PO TABS: 150.0000 mg | ORAL_TABLET | ORAL | 0 refills | Status: DC

## 2018-06-08 NOTE — Telephone Encounter (Signed)
Patient cancelled her appointment on 06/03/18 and her diarreah came back. What can she do? Possible c-diff. Please call patient

## 2018-06-08 NOTE — Telephone Encounter (Signed)
Patient has been advised to go to Urgent Care or ER for recurrent diarrhea.  She has been scheduled an appt but Dr. Verdis PrimeVanga's next availability is not until August.  I told her I could put her on a cancellation list to call if something came available sooner.  Thanks [employer]

## 2018-06-08 NOTE — Progress Notes (Signed)
Medication sent to the pharmamcy, note sent to mychart.

## 2018-06-13 ENCOUNTER — Other Ambulatory Visit: Payer: Self-pay

## 2018-06-13 ENCOUNTER — Emergency Department
Admission: EM | Admit: 2018-06-13 | Discharge: 2018-06-13 | Disposition: A | Discharge status: Home / Self Care | Payer: Medicaid Other | Attending: Student in an Organized Health Care Education/Training Program | Admitting: Student in an Organized Health Care Education/Training Program

## 2018-06-13 ENCOUNTER — Encounter: Payer: Self-pay | Admitting: *Deleted

## 2018-06-13 DIAGNOSIS — J45909 Unspecified asthma, uncomplicated: Secondary | ICD-10-CM | POA: Diagnosis not present

## 2018-06-13 DIAGNOSIS — F17210 Nicotine dependence, cigarettes, uncomplicated: Secondary | ICD-10-CM | POA: Diagnosis not present

## 2018-06-13 DIAGNOSIS — K649 Unspecified hemorrhoids: Secondary | ICD-10-CM | POA: Insufficient documentation

## 2018-06-13 DIAGNOSIS — R197 Diarrhea, unspecified: Secondary | ICD-10-CM | POA: Insufficient documentation

## 2018-06-13 LAB — URINALYSIS, COMPLETE (UACMP) WITH MICROSCOPIC
BILIRUBIN URINE: NEGATIVE
Glucose, UA: NEGATIVE mg/dL
HGB URINE DIPSTICK: NEGATIVE
Ketones, ur: NEGATIVE mg/dL
Leukocytes, UA: NEGATIVE
NITRITE: NEGATIVE
Protein, ur: NEGATIVE mg/dL
Specific Gravity, Urine: 1.026 (ref 1.005–1.030)
pH: 5 (ref 5.0–8.0)

## 2018-06-13 LAB — COMPREHENSIVE METABOLIC PANEL
ALBUMIN: 4.1 g/dL (ref 3.5–5.0)
ALT: 34 U/L (ref 0–44)
ANION GAP: 9 (ref 5–15)
AST: 25 U/L (ref 15–41)
Alkaline Phosphatase: 79 U/L (ref 38–126)
BILIRUBIN TOTAL: 0.3 mg/dL (ref 0.3–1.2)
BUN: 10 mg/dL (ref 6–20)
CO2: 22 mmol/L (ref 22–32)
Calcium: 9.2 mg/dL (ref 8.9–10.3)
Chloride: 109 mmol/L (ref 98–111)
Creatinine, Ser: 0.61 mg/dL (ref 0.44–1.00)
GFR calc Af Amer: >60 mL/min (ref >60)
GFR calc non Af Amer: >60 mL/min (ref >60)
Glucose, Bld: 104 mg/dL — ABNORMAL HIGH (ref 70–99)
POTASSIUM: 3.3 mmol/L — AB (ref 3.5–5.1)
Sodium: 140 mmol/L (ref 135–145)
TOTAL PROTEIN: 7.8 g/dL (ref 6.5–8.1)

## 2018-06-13 LAB — GASTROINTESTINAL PANEL BY PCR, STOOL (REPLACES STOOL CULTURE)
ADENOVIRUS F40/41: NOT DETECTED
ASTROVIRUS: NOT DETECTED
CAMPYLOBACTER SPECIES: NOT DETECTED
CYCLOSPORA CAYETANENSIS: NOT DETECTED
Cryptosporidium: NOT DETECTED
ENTEROPATHOGENIC E COLI (EPEC): NOT DETECTED
ENTEROTOXIGENIC E COLI (ETEC): NOT DETECTED
Entamoeba histolytica: NOT DETECTED
Enteroaggregative E coli (EAEC): NOT DETECTED
Giardia lamblia: NOT DETECTED
Norovirus GI/GII: NOT DETECTED
PLESIMONAS SHIGELLOIDES: NOT DETECTED
Rotavirus A: NOT DETECTED
Salmonella species: NOT DETECTED
Sapovirus (I, II, IV, and V): NOT DETECTED
Shiga like toxin producing E coli (STEC): NOT DETECTED
Shigella/Enteroinvasive E coli (EIEC): NOT DETECTED
VIBRIO SPECIES: NOT DETECTED
Vibrio cholerae: NOT DETECTED
Yersinia enterocolitica: NOT DETECTED

## 2018-06-13 LAB — CBC
HEMATOCRIT: 41.8 % (ref 35.0–47.0)
Hemoglobin: 14.5 g/dL (ref 12.0–16.0)
MCH: 29.4 pg (ref 26.0–34.0)
MCHC: 34.6 g/dL (ref 32.0–36.0)
MCV: 85 fL (ref 80.0–100.0)
Platelets: 308 10*3/uL (ref 150–440)
RBC: 4.92 MIL/uL (ref 3.80–5.20)
RDW: 13.8 % (ref 11.5–14.5)
WBC: 10.1 10*3/uL (ref 3.6–11.0)

## 2018-06-13 LAB — LIPASE, BLOOD: Lipase: 38 U/L (ref 11–51)

## 2018-06-13 MED ORDER — DICYCLOMINE HCL 10 MG PO CAPS: 10.0000 mg | ORAL_CAPSULE | Freq: Three times a day (TID) | ORAL | 0 refills | Status: DC | PRN

## 2018-06-13 MED ORDER — HYDROCORTISONE ACETATE 25 MG RE SUPP: 25.0000 mg | Freq: Two times a day (BID) | RECTAL | 1 refills | Status: AC

## 2018-06-13 NOTE — ED Triage Notes (Signed)
Pt reports diarrhea for 2 months.  Pt reports today blood in stools.  Pt taking otc meds without relief.  Pt alert.

## 2018-06-13 NOTE — ED Provider Notes (Signed)
[facility] Emergency Department Provider Note    First MD Initiated Contact with Patient 06/13/18 2005     (approximate)  I have reviewed the triage vital signs and the nursing notes.   HISTORY  Chief Complaint Diarrhea    HPI Samantha Villegas is a 27 y.o. female presents the ER with chief complaint of diarrhea for 2 months and noticing blood in her stool after wiping today.  States that she was empirically treated with Flagyl for presumed C. difficile but was never tested for this and has not had any change in symptoms.  She is to follow-up with GI.  Denies any fevers.  No nausea or vomiting.  States that she has persistent mild crampy abdominal pain that is generalized.  Denies any dysuria.  Denies any chance of being pregnant.  No chest pain or shortness of breath.    Past Medical History:  Diagnosis Date  . Anxiety    no meds  . Asthma    h/o no inhalers  . GERD (gastroesophageal reflux disease)    no meds  . History of itching   . Pyelonephritis   . Skipped heart beats    pt states this happens occ but is asymptomatic   Family History  Problem Relation Age of Onset  . Arthritis Mother   . Asthma Mother   . Hyperlipidemia Mother   . Hypertension Mother   . Stroke Mother   . Ulcers Mother   . Drug abuse Brother   . Early death Brother   . Cancer Maternal Grandmother   . Heart disease Paternal Grandmother   . Diabetes Paternal Aunt    Past Surgical History:  Procedure Laterality Date  . CESAREAN SECTION    . LAPAROSCOPIC OVARIAN CYSTECTOMY Right 05/26/2018   Procedure: LAPAROSCOPIC RIGHT OVARIAN CYSTECTOMY;  Surgeon: Natale Milch, MD;  Location: [facility] ORS;  Service: Gynecology;  Laterality: Right;   Patient Active Problem List   Diagnosis Date Noted  . Acute diarrhea 05/11/2018  . Right ovarian cyst 05/11/2018  . Allergic rhinitis 03/20/2018      Prior to Admission medications   Medication Sig Start Date End Date  Taking? Authorizing Provider  acetaminophen (TYLENOL) 500 MG tablet Take 1,000 mg by mouth every 6 (six) hours as needed (for pain.).    [provider]  fluconazole (DIFLUCAN) 150 MG tablet Take 1 tablet (150 mg total) by mouth every 3 (three) days. Can take additional dose three days later if symptoms persist 06/08/18   Schuman, Christanna R, MD  fluticasone (FLONASE) 50 MCG/ACT nasal spray Place 2 sprays into both nostrils 2 (two) times daily. Patient taking differently: Place 2 sprays into both nostrils 2 (two) times daily as needed for allergies.  03/18/18   Particia Nearing, PA-C  HYDROcodone-acetaminophen (NORCO/VICODIN) 5-325 MG tablet Take 1 tablet by mouth every 6 (six) hours as needed for severe pain. 05/26/18   Schuman, Jaquelyn Bitter, MD  ibuprofen (ADVIL,MOTRIN) 800 MG tablet Take 1 tablet (800 mg total) by mouth every 8 (eight) hours as needed. 05/26/18   Schuman, Jaquelyn Bitter, MD  metroNIDAZOLE (FLAGYL) 500 MG tablet Take 500 mg by mouth 2 (two) times daily.    [provider]  metroNIDAZOLE (FLAGYL) 500 MG tablet Take 1 tablet (500 mg total) by mouth 3 (three) times daily for 10 days. 06/04/18 06/14/18  Darci Current, MD  montelukast (SINGULAIR) 5 MG chewable tablet Chew 2 tablets (10 mg total) by mouth at bedtime.  Patient taking differently: Chew 10 mg by mouth at bedtime as needed (for allergies.).  03/18/18   Particia NearingLane, Rachel Elizabeth, PA-C    Allergies Latex    Social History Social History   Tobacco Use  . Smoking status: Current Every Day Smoker    Packs/day: 1.00    Years: 10.00    Pack years: 10.00    Types: Cigarettes  . Smokeless tobacco: Never Used  Substance Use Topics  . Alcohol use: Yes    Comment: social  . Drug use: Yes    Types: Marijuana    Comment: everyday    Review of Systems Patient denies headaches, rhinorrhea, blurry vision, numbness, shortness of breath, chest pain, edema, cough, abdominal pain, nausea, vomiting, diarrhea,  dysuria, fevers, rashes or hallucinations unless otherwise stated above in HPI. ____________________________________________   PHYSICAL EXAM:  VITAL SIGNS: Vitals:   06/13/18 1855  BP: 130/65  Pulse: 94  Resp: 18  Temp: 99.5 F (37.5 C)  SpO2: 98%    Constitutional: Alert and oriented.  Eyes: Conjunctivae are normal.  Head: Atraumatic. Nose: No congestion/rhinnorhea. Mouth/Throat: Mucous membranes are moist.   Neck: No stridor. Painless ROM.  Cardiovascular: Normal rate, regular rhythm. Grossly normal heart sounds.  Good peripheral circulation. Respiratory: Normal respiratory effort.  No retractions. Lungs CTAB. Gastrointestinal: Soft and nontender. No distention. No abdominal bruits. No CVA tenderness. Genitourinary: Normal external genitalia with moderate size nonthrombosed nontender hemorrhoid no evidence of active hemorrhage or bleeding.  No evidence of anal fissure. Musculoskeletal: No lower extremity tenderness nor edema.  No joint effusions. Neurologic:  Normal speech and language. No gross focal neurologic deficits are appreciated. No facial droop Skin:  Skin is warm, dry and intact. No rash noted. Psychiatric: Mood and affect are normal. Speech and behavior are normal.  ____________________________________________   LABS (all labs ordered are listed, but only abnormal results are displayed)  Results for orders placed or performed during the hospital encounter of 06/13/18 (from the past 24 hour(s))  Lipase, blood     Status: None   Collection Time: 06/13/18  6:58 PM  Result Value Ref Range   Lipase 38 11 - 51 U/L  Comprehensive metabolic panel     Status: Abnormal   Collection Time: 06/13/18  6:58 PM  Result Value Ref Range   Sodium 140 135 - 145 mmol/L   Potassium 3.3 (L) 3.5 - 5.1 mmol/L   Chloride 109 98 - 111 mmol/L   CO2 22 22 - 32 mmol/L   Glucose, Bld 104 (H) 70 - 99 mg/dL   BUN 10 6 - 20 mg/dL   Creatinine, Ser 7.820.61 0.44 - 1.00 mg/dL   Calcium 9.2  8.9 - 95.610.3 mg/dL   Total Protein 7.8 6.5 - 8.1 g/dL   Albumin 4.1 3.5 - 5.0 g/dL   AST 25 15 - 41 U/L   ALT 34 0 - 44 U/L   Alkaline Phosphatase 79 38 - 126 U/L   Total Bilirubin 0.3 0.3 - 1.2 mg/dL   GFR calc non Af Amer >60 >60 mL/min   GFR calc Af Amer >60 >60 mL/min   Anion gap 9 5 - 15  CBC     Status: None   Collection Time: 06/13/18  6:58 PM  Result Value Ref Range   WBC 10.1 3.6 - 11.0 K/uL   RBC 4.92 3.80 - 5.20 MIL/uL   Hemoglobin 14.5 12.0 - 16.0 g/dL   HCT 21.341.8 08.635.0 - 57.847.0 %   MCV 85.0 80.0 -  100.0 fL   MCH 29.4 26.0 - 34.0 pg   MCHC 34.6 32.0 - 36.0 g/dL   RDW 40.9 81.1 - 91.4 %   Platelets 308 150 - 440 K/uL  Urinalysis, Complete w Microscopic     Status: Abnormal   Collection Time: 06/13/18  7:05 PM  Result Value Ref Range   Color, Urine YELLOW (A) YELLOW   APPearance CLEAR (A) CLEAR   Specific Gravity, Urine 1.026 1.005 - 1.030   pH 5.0 5.0 - 8.0   Glucose, UA NEGATIVE NEGATIVE mg/dL   Hgb urine dipstick NEGATIVE NEGATIVE   Bilirubin Urine NEGATIVE NEGATIVE   Ketones, ur NEGATIVE NEGATIVE mg/dL   Protein, ur NEGATIVE NEGATIVE mg/dL   Nitrite NEGATIVE NEGATIVE   Leukocytes, UA NEGATIVE NEGATIVE   RBC / HPF 0-5 0 - 5 RBC/hpf   WBC, UA 0-5 0 - 5 WBC/hpf   Bacteria, UA RARE (A) NONE SEEN   Squamous Epithelial / LPF 6-10 0 - 5   Mucus PRESENT    ____________________________________________  EKG____________________________________________  RADIOLOGY   ____________________________________________   PROCEDURES  Procedure(s) performed:  Procedures    Critical Care performed: no ____________________________________________   INITIAL IMPRESSION / ASSESSMENT AND PLAN / ED COURSE  Pertinent labs & imaging results that were available during my care of the patient were reviewed by me and considered in my medical decision making (see chart for details).   DDX: infectious colitis, ibd, enteritis, functional diarrhea, hemorrhoid  Samantha Villegas is a 27 y.o. who presents to the ED with sx as described above.  Patient is AFVSS in ED. Exam as above. Given current presentation have considered the above differential.  Blood work reassuring.  + hemorrhoids.  No evidence of active bleed.  GI panel reassuring.  No indication for emergent ct imaging based on exam.  Patient was able to tolerate PO and was able to ambulate with a steady gait.  Have discussed with the patient and available family all diagnostics and treatments performed thus far and all questions were answered to the best of my ability. The patient demonstrates understanding and agreement with plan.       As part of my medical decision making, I reviewed the following data within the electronic MEDICAL RECORD NUMBER Nursing notes reviewed and incorporated, Labs reviewed, notes from prior ED visits and [place] Controlled Substance Database   ____________________________________________   FINAL CLINICAL IMPRESSION(S) / ED DIAGNOSES  Final diagnoses:  Diarrhea, unspecified type  Hemorrhoids, unspecified hemorrhoid type      NEW MEDICATIONS STARTED DURING THIS VISIT:  New Prescriptions   No medications on file     Note:  This document was prepared using Dragon voice recognition software and may include unintentional dictation errors.    Willy Eddy, MD 06/13/18 [phone]

## 2018-06-13 NOTE — Discharge Instructions (Addendum)

## 2018-06-16 NOTE — Telephone Encounter (Signed)
LVM asking pt to return call 

## 2018-06-21 ENCOUNTER — Encounter: Payer: Self-pay | Admitting: Gastroenterology

## 2018-06-24 ENCOUNTER — Ambulatory Visit (INDEPENDENT_AMBULATORY_CARE_PROVIDER_SITE_OTHER): Payer: Managed Care, Other (non HMO) | Admitting: Gastroenterology

## 2018-06-24 ENCOUNTER — Encounter: Payer: Self-pay | Admitting: Gastroenterology

## 2018-06-24 ENCOUNTER — Other Ambulatory Visit: Payer: Self-pay

## 2018-06-24 VITALS — BP 116/79 | HR 72 | Ht 66.0 in | Wt 284.8 lb

## 2018-06-24 DIAGNOSIS — K529 Noninfective gastroenteritis and colitis, unspecified: Secondary | ICD-10-CM | POA: Diagnosis not present

## 2018-06-24 NOTE — Progress Notes (Signed)
Arlyss Repress, MD [address]  [address]  [place], [place] [address]  Main: [phone]  Fax: [phone]    Gastroenterology Consultation  Referring Provider:     Particia Nearing,* Primary Care Physician:  Particia Nearing, PA-C Primary Gastroenterologist:  Dr. Arlyss Repress Reason for Consultation: acute diarrhea        HPI:   Samantha Villegas is a 27 y.o. female referred by Dr. Maurice March, Salley Hews, PA-C  for consultation & management of acute diarrhea. She reports that the last 2 weeks, she has been experiencing several episodes of nonbloody diarrhea. She reports that she had hamburger before the onset of symptoms. She was told that she may have Salmonella and was started on Cipro and Flagyl by her primary care provider. However, her symptoms got worse associated with lower abdominal cramps, nausea and went to the ER on 05/08/2018. She was told that she has UTI and was recommended to stop ciprofloxacin but continue Flagyl, she was also discharged on Keflex for UTI. She had GI pathogen panel done at that time and it was negative. However, C. Difficile was not checked. She was discharged on Imodium needed for the last 2 days she is taking Imodium up to 2 pills daily and reports that she has been having 1 or 2 bowel movements, soft in consistency and nonbloody. She reports mild nausea but otherwise denies vomiting. She reports mild right upper quadrant pain radiating to the back. She also had a CT yesterday protocol in the ER and showed mild enlarged right lobe of the liver. Otherwise normal gallbladder and normal CBD. She had possible thickening of the ascending colon.  She used to drink carbonated beverages and stopped about 3 days ago. She used to consume red meat regularly which she discontinued a few weeks ago. She denies fever, chills, weight loss Her labs including CBC, CMP, lipase was unremarkable in the ER She reports being asymptomatic without any GI  symptoms prior to this episode. She used to have 1 or 2 formed bowel movements daily  Follow-up visit 06/24/2018 She had recurrence of diarrhea, went to emergency room. She underwent stool studies which were negative for bacterial and viral pathogens. However, stool for C. Difficile was not checked. She was empirically discharged on Flagyl. Patient reports that she has been on Flagyl for at least 4 weeks to date. She continues to have ongoing nonbloody diarrhea, 4-5 episodes daily associated with mucus. She is taking Bentyl 10 mg as needed that helps with cramps only. Imodium helps temporarily. She is frustrated with ongoing diarrhea. Her weight has been stable. She reports avoiding red meat, avoiding sodas, sweet tea, artificial sweeteners. She does drink fruit juices but dilutes with water  NSAIDs: none  Antiplts/Anticoagulants/Anti thrombotics: none  GI Procedures: none She denies family history of inflammatory bowel disease, GI malignancy  Past Medical History:  Diagnosis Date  . Anxiety    no meds  . Asthma    h/o no inhalers  . GERD (gastroesophageal reflux disease)    no meds  . History of itching   . Pyelonephritis   . Skipped heart beats    pt states this happens occ but is asymptomatic    Past Surgical History:  Procedure Laterality Date  . CESAREAN SECTION    . LAPAROSCOPIC OVARIAN CYSTECTOMY Right 05/26/2018   Procedure: LAPAROSCOPIC RIGHT OVARIAN CYSTECTOMY;  Surgeon: Natale Milch, MD;  Location: [facility] ORS;  Service: Gynecology;  Laterality: Right;  Current Outpatient Medications:  .  acetaminophen (TYLENOL) 500 MG tablet, Take 1,000 mg by mouth every 6 (six) hours as needed (for pain.)., Disp: , Rfl:  .  dicyclomine (BENTYL) 10 MG capsule, Take 1 capsule (10 mg total) by mouth 3 (three) times daily as needed for up to 14 days for spasms., Disp: 16 capsule, Rfl: 0 .  fluticasone (FLONASE) 50 MCG/ACT nasal spray, Place 2 sprays into both nostrils 2 (two) times  daily. (Patient taking differently: Place 2 sprays into both nostrils 2 (two) times daily as needed for allergies. ), Disp: 16 g, Rfl: 11 .  hydrocortisone (ANUSOL-HC) 25 MG suppository, Place 1 suppository (25 mg total) rectally every 12 (twelve) hours., Disp: 12 suppository, Rfl: 1 .  ibuprofen (ADVIL,MOTRIN) 800 MG tablet, Take 1 tablet (800 mg total) by mouth every 8 (eight) hours as needed., Disp: 30 tablet, Rfl: 0 .  montelukast (SINGULAIR) 5 MG chewable tablet, Chew 2 tablets (10 mg total) by mouth at bedtime. (Patient taking differently: Chew 10 mg by mouth at bedtime as needed (for allergies.). ), Disp: 60 tablet, Rfl: 11 .  HYDROcodone-acetaminophen (NORCO/VICODIN) 5-325 MG tablet, Take 1 tablet by mouth every 6 (six) hours as needed for severe pain. (Patient not taking: Reported on 06/24/2018), Disp: 5 tablet, Rfl: 0 .  SPRINTEC 28 0.25-35 MG-MCG tablet, , Disp: , Rfl:    Family History  Problem Relation Age of Onset  . Arthritis Mother   . Asthma Mother   . Hyperlipidemia Mother   . Hypertension Mother   . Stroke Mother   . Ulcers Mother   . Drug abuse Brother   . Early death Brother   . Cancer Maternal Grandmother   . Heart disease Paternal Grandmother   . Diabetes Paternal Aunt      Social History   Tobacco Use  . Smoking status: Current Every Day Smoker    Packs/day: 1.00    Years: 10.00    Pack years: 10.00    Types: Cigarettes  . Smokeless tobacco: Never Used  Substance Use Topics  . Alcohol use: Yes    Comment: social  . Drug use: Yes    Types: Marijuana    Comment: everyday    Allergies as of 06/24/2018 - Review Complete 06/24/2018  Allergen Reaction Noted  . Latex Other (See Comments) 05/19/2018    Review of Systems:    All systems reviewed and negative except where noted in HPI.   Physical Exam:  BP 116/79   Pulse 72   Ht 5\' 6"  (1.676 m)   Wt 284 lb 12.8 oz (129.2 kg)   LMP 06/02/2018 (Approximate)   BMI 45.97 kg/m  Patient's last menstrual  period was 06/02/2018 (approximate).  General:   Alert, obese, Well-developed, well-nourished, pleasant and cooperative in NAD Head:  Normocephalic and atraumatic. Eyes:  Sclera clear, no icterus.   Conjunctiva pink. Ears:  Normal auditory acuity. Nose:  No deformity, discharge, or lesions. Mouth:  No deformity or lesions,oropharynx pink & moist. Neck:  Supple; no masses or thyromegaly. Lungs:  Respirations even and unlabored.  Clear throughout to auscultation.   No wheezes, crackles, or rhonchi. No acute distress. Heart:  Regular rate and rhythm; no murmurs, clicks, rubs, or gallops. Abdomen:  Normal bowel sounds. Soft, orbidly obese, non-tender and non-distended, limited abdominal exam due to body habitus.  No guarding or rebound tenderness.   Rectal: Not performed Msk:  Symmetrical without gross deformities. Good, equal movement & strength bilaterally. Pulses:  Normal pulses  noted. Extremities:  No clubbing or edema.  No cyanosis. Neurologic:  Alert and oriented x3;  grossly normal neurologically. Skin:  Intact without significant lesions or rashes. No jaundice. Lymph Nodes:  No significant cervical adenopathy. Psych:  Alert and cooperative. Normal mood and affect.  Imaging Studies: reviewed  Assessment and Plan:   Samantha Villegas is a 27 y.o. female with morbid obesity, with approximately 2 months history of nonbloody diarrhea, GI pathogen panel negative, unknown C. Difficile status, empirically treated with metronidazole. CT with nonspecific thickening of ascending colon.   - rule out C. Difficile infection - Check fecal calprotectin - Continue Bentyl as needed - If above tests are negative, will give empiric trial of amitriptyline for IBS diarrhea   Follow up in 4 weeks   Arlyss Repress, MD

## 2018-06-27 ENCOUNTER — Other Ambulatory Visit
Admission: RE | Admit: 2018-06-27 | Discharge: 2018-06-27 | Disposition: A | Discharge status: Home / Self Care | Payer: Managed Care, Other (non HMO) | Source: Ambulatory Visit | Attending: Gastroenterology | Admitting: Gastroenterology

## 2018-06-27 DIAGNOSIS — K529 Noninfective gastroenteritis and colitis, unspecified: Secondary | ICD-10-CM | POA: Diagnosis present

## 2018-06-27 LAB — CLOSTRIDIUM DIFFICILE BY PCR, REFLEXED: Toxigenic C. Difficile by PCR: POSITIVE — AB

## 2018-06-27 LAB — C DIFFICILE QUICK SCREEN W PCR REFLEX
C Diff antigen: POSITIVE — AB
C Diff toxin: NEGATIVE

## 2018-06-28 ENCOUNTER — Telehealth: Payer: Self-pay | Admitting: Gastroenterology

## 2018-06-28 ENCOUNTER — Other Ambulatory Visit: Payer: Self-pay

## 2018-06-28 MED ORDER — VANCOMYCIN HCL 125 MG PO CAPS: 125.0000 mg | ORAL_CAPSULE | Freq: Four times a day (QID) | ORAL | 0 refills | Status: AC

## 2018-06-28 NOTE — Telephone Encounter (Signed)
C. Difficile toxin by PCR came back positive. Patient continues to have ongoing diarrhea. Recommend 2 week course of oral vancomycin 125 mg 4 times daily  Sent prescription to the pharmacy  Arlyss Repressohini R Delford Wingert, MD [address]  [address]  [place], [place] [address]  Main: [phone]  Fax: [phone] Pager: [phone]

## 2018-06-29 LAB — CALPROTECTIN, FECAL: Calprotectin, Fecal: 124 ug/g — ABNORMAL HIGH (ref 0–120)

## 2018-07-05 ENCOUNTER — Encounter: Payer: Self-pay | Admitting: Gastroenterology

## 2018-07-18 ENCOUNTER — Ambulatory Visit: Payer: Managed Care, Other (non HMO) | Admitting: Obstetrics and Gynecology

## 2018-07-18 ENCOUNTER — Ambulatory Visit: Payer: Managed Care, Other (non HMO) | Admitting: Gastroenterology

## 2018-07-18 ENCOUNTER — Other Ambulatory Visit: Payer: Managed Care, Other (non HMO)

## 2018-07-19 ENCOUNTER — Encounter: Payer: Self-pay | Admitting: Gastroenterology

## 2018-07-22 ENCOUNTER — Other Ambulatory Visit: Payer: Self-pay

## 2018-07-22 DIAGNOSIS — K529 Noninfective gastroenteritis and colitis, unspecified: Secondary | ICD-10-CM

## 2018-07-26 ENCOUNTER — Other Ambulatory Visit: Payer: Self-pay

## 2018-07-26 ENCOUNTER — Other Ambulatory Visit
Admission: RE | Admit: 2018-07-26 | Discharge: 2018-07-26 | Disposition: A | Discharge status: Home / Self Care | Payer: Medicaid Other | Source: Ambulatory Visit | Attending: Gastroenterology | Admitting: Gastroenterology

## 2018-07-26 ENCOUNTER — Encounter: Payer: Self-pay | Admitting: Gastroenterology

## 2018-07-26 DIAGNOSIS — K529 Noninfective gastroenteritis and colitis, unspecified: Secondary | ICD-10-CM | POA: Insufficient documentation

## 2018-07-26 LAB — C DIFFICILE QUICK SCREEN W PCR REFLEX
C DIFFICILE (CDIFF) TOXIN: NEGATIVE
C DIFFICLE (CDIFF) ANTIGEN: POSITIVE — AB

## 2018-07-26 LAB — CLOSTRIDIUM DIFFICILE BY PCR, REFLEXED: Toxigenic C. Difficile by PCR: POSITIVE — AB

## 2018-07-26 MED ORDER — FIDAXOMICIN 200 MG PO TABS: 200.0000 mg | ORAL_TABLET | Freq: Two times a day (BID) | ORAL | 0 refills | Status: AC

## 2018-07-27 ENCOUNTER — Telehealth: Payer: Self-pay | Admitting: Gastroenterology

## 2018-07-27 NOTE — Telephone Encounter (Signed)
Called [facility] and submitted PA and prior Berkley Harveyauth is in pending status, pt has been notified

## 2018-07-27 NOTE — Telephone Encounter (Signed)
Patient called and stated the medication that Dr. Allegra LaiVanga called in yesterday needs authorization. Please call patient ASAP to discuss this.

## 2018-08-03 ENCOUNTER — Telehealth: Payer: Self-pay | Admitting: Gastroenterology

## 2018-08-03 NOTE — Telephone Encounter (Signed)
Patient called and she just found out that she is pregnant. Can she still take the new medication?

## 2018-08-04 NOTE — Telephone Encounter (Signed)
Spoke with pt and notified her that is ok to take medication during pregnancy per Dr. Allegra LaiVanga

## 2018-08-10 ENCOUNTER — Ambulatory Visit: Payer: Medicaid Other | Admitting: Family Medicine

## 2018-08-10 ENCOUNTER — Encounter: Payer: Self-pay | Admitting: Gastroenterology

## 2018-08-10 ENCOUNTER — Encounter: Payer: Self-pay | Admitting: Family Medicine

## 2018-08-10 VITALS — BP 97/66 | HR 76 | Temp 98.5°F | Ht 66.0 in | Wt 272.8 lb

## 2018-08-10 DIAGNOSIS — J069 Acute upper respiratory infection, unspecified: Secondary | ICD-10-CM

## 2018-08-10 DIAGNOSIS — R058 Other specified cough: Secondary | ICD-10-CM

## 2018-08-10 DIAGNOSIS — Z3201 Encounter for pregnancy test, result positive: Secondary | ICD-10-CM

## 2018-08-10 DIAGNOSIS — R21 Rash and other nonspecific skin eruption: Secondary | ICD-10-CM

## 2018-08-10 DIAGNOSIS — R05 Cough: Secondary | ICD-10-CM

## 2018-08-10 DIAGNOSIS — B9789 Other viral agents as the cause of diseases classified elsewhere: Secondary | ICD-10-CM

## 2018-08-10 MED ORDER — ALBUTEROL SULFATE HFA 108 (90 BASE) MCG/ACT IN AERS: 2.0000 | INHALATION_SPRAY | Freq: Four times a day (QID) | RESPIRATORY_TRACT | 1 refills | Status: DC | PRN

## 2018-08-10 MED ORDER — KETOCONAZOLE 2 % EX CREA: 1.0000 "application " | TOPICAL_CREAM | Freq: Two times a day (BID) | CUTANEOUS | 0 refills | Status: DC

## 2018-08-10 NOTE — Progress Notes (Signed)
BP 97/66 (BP Location: Right Arm, Patient Position: Sitting, Cuff Size: Large)   Pulse 76   Temp 98.5 F (36.9 C) (Oral)   Ht 5\' 6"  (1.676 m)   Wt 272 lb 12.8 oz (123.7 kg)   SpO2 97%   BMI 44.03 kg/m    Subjective:    Patient ID: Samantha Villegas, female    DOB: 1991-01-21, 27 y.o.   MRN: [id number]  HPI: Samantha Villegas is a 27 y.o. female  Chief Complaint  Patient presents with  . Sore Throat    Ongoing for 4 days, symptoms progressing.  . Cough  . Wheezing  . Nasal Congestion  . Possible Pregnancy    Took at home pregnancy test August 28th, result was positive.   4 days of SOB, wheezing, congestion, productive cough, rattle in chest, sore throat, fatigue. Hx of asthma as a teenager, used an inhaler for 2 years but has been ok since. Does get bronchitis often. Stopped smoking 4 days ago. Denies fevers, chills, CP. No sick contacts.   Positive home pregnancy test. LMP was July 26th which was abnormal so she's not sure if it was a true period. Has had some fatigue and nausea.   Raised, circular rash on left forearm, not itchy or flaking. Present for a few weeks. Not tried anything OTC for it.   Past Medical History:  Diagnosis Date  . Anxiety    no meds  . Asthma    h/o no inhalers  . GERD (gastroesophageal reflux disease)    no meds  . History of itching   . Pyelonephritis   . Skipped heart beats    pt states this happens occ but is asymptomatic   Social History   Socioeconomic History  . Marital status: Single    Spouse name: Not on file  . Number of children: Not on file  . Years of education: Not on file  . Highest education level: Not on file  Occupational History  . Not on file  Social Needs  . Financial resource strain: Not on file  . Food insecurity:    Worry: Not on file    Inability: Not on file  . Transportation needs:    Medical: Not on file    Non-medical: Not on file  Tobacco Use  . Smoking status: Former Smoker   Packs/day: 1.00    Years: 10.00    Pack years: 10.00    Types: Cigarettes    Last attempt to quit: 08/08/2018    Years since quitting: 0.0  . Smokeless tobacco: Never Used  Substance and Sexual Activity  . Alcohol use: Yes    Comment: social  . Drug use: Yes    Types: Marijuana    Comment: Occassionally  . Sexual activity: Yes  Lifestyle  . Physical activity:    Days per week: 1 day    Minutes per session: 30 min  . Stress: Rather much  Relationships  . Social connections:    Talks on phone: Not on file    Gets together: Not on file    Attends religious service: Not on file    Active member of club or organization: Not on file    Attends meetings of clubs or organizations: Not on file    Relationship status: Not on file  . Intimate partner violence:    Fear of current or ex partner: Not on file    Emotionally abused: Not on file    Physically abused:  Not on file    Forced sexual activity: Not on file  Other Topics Concern  . Not on file  Social History Narrative  . Not on file   Relevant past medical, surgical, family and social history reviewed and updated as indicated. Interim medical history since our last visit reviewed. Allergies and medications reviewed and updated.  Review of Systems  Per HPI unless specifically indicated above     Objective:    BP 97/66 (BP Location: Right Arm, Patient Position: Sitting, Cuff Size: Large)   Pulse 76   Temp 98.5 F (36.9 C) (Oral)   Ht 5\' 6"  (1.676 m)   Wt 272 lb 12.8 oz (123.7 kg)   SpO2 97%   BMI 44.03 kg/m   Wt Readings from Last 3 Encounters:  08/10/18 272 lb 12.8 oz (123.7 kg)  06/24/18 284 lb 12.8 oz (129.2 kg)  06/13/18 283 lb (128.4 kg)    Physical Exam  Constitutional: She is oriented to person, place, and time. She appears well-developed and well-nourished. No distress.  HENT:  Head: Atraumatic.  Right Ear: External ear normal.  Left Ear: External ear normal.  B/l middle ear effusion Oropharynx  erythematous, edematous Nasal mucosa erythematous with drainage present  Eyes: Conjunctivae and EOM are normal.  Neck: Normal range of motion. Neck supple.  Cardiovascular: Normal rate, regular rhythm and normal heart sounds.  Pulmonary/Chest: Effort normal and breath sounds normal.  Musculoskeletal: Normal range of motion.  Neurological: She is alert and oriented to person, place, and time.  Skin: Skin is warm and dry. Rash (circular rash on left forearm with raised borders) noted.  Psychiatric: She has a normal mood and affect. Her behavior is normal.  Nursing note and vitals reviewed.   Results for orders placed or performed in visit on 08/10/18  Rapid Strep Screen ([facility] ONLY)  Result Value Ref Range   Strep Gp A Ag, IA W/Reflex Negative Negative  Culture, Group A Strep  Result Value Ref Range   Strep A Culture Negative   Veritor Flu A/B Waived  Result Value Ref Range   Influenza A Negative Negative   Influenza B Negative Negative  Pregnancy, urine  Result Value Ref Range   Preg Test, Ur Positive (A) Negative      Assessment & Plan:   Problem List Items Addressed This Visit    None    Visit Diagnoses    Viral URI with cough    -  Primary   Supportive care for now with flonase, zyrtec, singulair, humidifier, etc. Continue albuterol inhaler. Will add prednisone if needed   Relevant Medications   ketoconazole (NIZORAL) 2 % cream   Other Relevant Orders   Rapid Strep Screen ([facility] ONLY) (Completed)   Veritor Flu A/B Waived (Completed)   Positive pregnancy test       Congratulated pt, she has already initiated a prenatal vitamin and smoking cessation. Make OB appt around [redacted] weeks gestation   Relevant Orders   Pregnancy, urine (Completed)   Productive cough       Relevant Orders   Rapid Strep Screen ([facility] ONLY) (Completed)   Veritor Flu A/B Waived (Completed)   Rash       Suspect ringworn vs granuloma annulare. Will tx with ketoconazole  cream and monitor for improvement       Follow up plan: Return if symptoms worsen or fail to improve.

## 2018-08-12 LAB — VERITOR FLU A/B WAIVED
Influenza A: NEGATIVE
Influenza B: NEGATIVE

## 2018-08-12 LAB — CULTURE, GROUP A STREP: Strep A Culture: NEGATIVE

## 2018-08-12 LAB — PREGNANCY, URINE: PREG TEST UR: POSITIVE — AB

## 2018-08-12 LAB — RAPID STREP SCREEN (MED CTR MEBANE ONLY): Strep Gp A Ag, IA W/Reflex: NEGATIVE

## 2018-08-13 NOTE — Patient Instructions (Signed)
Follow up as needed

## 2018-08-15 ENCOUNTER — Encounter: Payer: Self-pay | Admitting: Family Medicine

## 2018-08-15 MED ORDER — KETOCONAZOLE 2 % EX CREA: 1.0000 "application " | TOPICAL_CREAM | Freq: Two times a day (BID) | CUTANEOUS | 0 refills | Status: DC

## 2018-08-29 DIAGNOSIS — A498 Other bacterial infections of unspecified site: Secondary | ICD-10-CM | POA: Insufficient documentation

## 2018-08-29 DIAGNOSIS — O99210 Obesity complicating pregnancy, unspecified trimester: Secondary | ICD-10-CM | POA: Insufficient documentation

## 2018-08-29 DIAGNOSIS — J45909 Unspecified asthma, uncomplicated: Secondary | ICD-10-CM | POA: Insufficient documentation

## 2018-08-29 DIAGNOSIS — O0991 Supervision of high risk pregnancy, unspecified, first trimester: Secondary | ICD-10-CM | POA: Insufficient documentation

## 2018-08-29 DIAGNOSIS — O99519 Diseases of the respiratory system complicating pregnancy, unspecified trimester: Secondary | ICD-10-CM | POA: Insufficient documentation

## 2018-08-30 ENCOUNTER — Encounter: Payer: Self-pay | Admitting: Gastroenterology

## 2018-08-30 ENCOUNTER — Other Ambulatory Visit: Payer: Self-pay

## 2018-08-30 ENCOUNTER — Ambulatory Visit: Payer: Medicaid Other | Admitting: Gastroenterology

## 2018-08-30 VITALS — BP 116/81 | HR 74 | Resp 16 | Ht 66.0 in | Wt 271.6 lb

## 2018-08-30 DIAGNOSIS — K529 Noninfective gastroenteritis and colitis, unspecified: Secondary | ICD-10-CM

## 2018-08-30 DIAGNOSIS — A0471 Enterocolitis due to Clostridium difficile, recurrent: Secondary | ICD-10-CM | POA: Diagnosis not present

## 2018-08-30 NOTE — Progress Notes (Signed)
Arlyss Repress, MD [address]  [address]  [place], [place] [address]  Main: [phone]  Fax: [phone]    Gastroenterology Consultation  Referring Provider:     Particia Nearing,* Primary Care Physician:  Particia Nearing, PA-C Primary Gastroenterologist:  Dr. Arlyss Repress Reason for Consultation: Recurrent C. Difficile diarrhea        HPI:   Samantha Villegas is a 27 y.o. female referred by Dr. Maurice March, Salley Hews, PA-C  for consultation & management of acute diarrhea. She reports that the last 2 weeks, she has been experiencing several episodes of nonbloody diarrhea. She reports that she had hamburger before the onset of symptoms. She was told that she may have Salmonella and was started on Cipro and Flagyl by her primary care provider. However, her symptoms got worse associated with lower abdominal cramps, nausea and went to the ER on 05/08/2018. She was told that she has UTI and was recommended to stop ciprofloxacin but continue Flagyl, she was also discharged on Keflex for UTI. She had GI pathogen panel done at that time and it was negative. However, C. Difficile was not checked. She was discharged on Imodium needed for the last 2 days she is taking Imodium up to 2 pills daily and reports that she has been having 1 or 2 bowel movements, soft in consistency and nonbloody. She reports mild nausea but otherwise denies vomiting. She reports mild right upper quadrant pain radiating to the back. She also had a CT yesterday protocol in the ER and showed mild enlarged right lobe of the liver. Otherwise normal gallbladder and normal CBD. She had possible thickening of the ascending colon.  She used to drink carbonated beverages and stopped about 3 days ago. She used to consume red meat regularly which she discontinued a few weeks ago. She denies fever, chills, weight loss Her labs including CBC, CMP, lipase was unremarkable in the ER She reports being  asymptomatic without any GI symptoms prior to this episode. She used to have 1 or 2 formed bowel movements daily  Follow-up visit 06/24/2018 She had recurrence of diarrhea, went to emergency room. She underwent stool studies which were negative for bacterial and viral pathogens. However, stool for C. Difficile was not checked. She was empirically discharged on Flagyl. Patient reports that she has been on Flagyl for at least 4 weeks to date. She continues to have ongoing nonbloody diarrhea, 4-5 episodes daily associated with mucus. She is taking Bentyl 10 mg as needed that helps with cramps only. Imodium helps temporarily. She is frustrated with ongoing diarrhea. Her weight has been stable. She reports avoiding red meat, avoiding sodas, sweet tea, artificial sweeteners. She does drink fruit juices but dilutes with water  Follow-up visit 08/30/2018 Patient had 2 episodes of C. Difficile colitis. Initially was treated with vancomycin x 2 weeks and first recurrence with fidaxomicin for 10 days. She found that she is [redacted] weeks pregnant. She continues to have intermittent episodes of loose stools, nonbloody, associated with abdominal cramps. Her appetite is good, denies nausea or vomiting, blood in stools, abdominal pain. Her bowel frequency fluctuates between 1 per day and 3-4 soft stools/day. She has not tried Bentyl  NSAIDs: none  Antiplts/Anticoagulants/Anti thrombotics: none  GI Procedures: none She denies family history of inflammatory bowel disease, GI malignancy  Past Medical History:  Diagnosis Date  . Anxiety    no meds  . Asthma    h/o no inhalers  . GERD (gastroesophageal  reflux disease)    no meds  . History of itching   . Pyelonephritis   . Skipped heart beats    pt states this happens occ but is asymptomatic    Past Surgical History:  Procedure Laterality Date  . CESAREAN SECTION    . LAPAROSCOPIC OVARIAN CYSTECTOMY Right 05/26/2018   Procedure: LAPAROSCOPIC RIGHT OVARIAN  CYSTECTOMY;  Surgeon: Natale MilchSchuman, Christanna R, MD;  Location: [facility] ORS;  Service: Gynecology;  Laterality: Right;    Current Outpatient Medications:  .  acetaminophen (TYLENOL) 500 MG tablet, Take 1,000 mg by mouth every 6 (six) hours as needed (for pain.)., Disp: , Rfl:  .  aspirin EC 81 MG tablet, Take by mouth., Disp: , Rfl:  .  fluticasone (FLONASE) 50 MCG/ACT nasal spray, Place 2 sprays into both nostrils 2 (two) times daily. (Patient taking differently: Place 2 sprays into both nostrils 2 (two) times daily as needed for allergies. ), Disp: 16 g, Rfl: 11 .  hydrocortisone (ANUSOL-HC) 25 MG suppository, Place 1 suppository (25 mg total) rectally every 12 (twelve) hours., Disp: 12 suppository, Rfl: 1 .  ketoconazole (NIZORAL) 2 % cream, Apply 1 application topically 2 (two) times daily., Disp: 15 g, Rfl: 0 .  montelukast (SINGULAIR) 5 MG chewable tablet, Chew 2 tablets (10 mg total) by mouth at bedtime. (Patient taking differently: Chew 10 mg by mouth at bedtime as needed (for allergies.). ), Disp: 60 tablet, Rfl: 11 .  albuterol (PROVENTIL HFA;VENTOLIN HFA) 108 (90 Base) MCG/ACT inhaler, Inhale 2 puffs into the lungs every 6 (six) hours as needed for wheezing or shortness of breath. (Patient not taking: Reported on 08/30/2018), Disp: 1 Inhaler, Rfl: 1 .  dicyclomine (BENTYL) 10 MG capsule, Take 1 capsule (10 mg total) by mouth 3 (three) times daily as needed for up to 14 days for spasms., Disp: 16 capsule, Rfl: 0 .  HYDROcodone-acetaminophen (NORCO/VICODIN) 5-325 MG tablet, Take 1 tablet by mouth every 6 (six) hours as needed for severe pain. (Patient not taking: Reported on 06/24/2018), Disp: 5 tablet, Rfl: 0 .  ibuprofen (ADVIL,MOTRIN) 800 MG tablet, Take 1 tablet (800 mg total) by mouth every 8 (eight) hours as needed. (Patient not taking: Reported on 08/30/2018), Disp: 30 tablet, Rfl: 0   Family History  Problem Relation Age of Onset  . Arthritis Mother   . Asthma Mother   . Hyperlipidemia  Mother   . Hypertension Mother   . Stroke Mother   . Ulcers Mother   . Drug abuse Brother   . Early death Brother   . Cancer Maternal Grandmother   . Heart disease Paternal Grandmother   . Diabetes Paternal Aunt      Social History   Tobacco Use  . Smoking status: Former Smoker    Packs/day: 1.00    Years: 10.00    Pack years: 10.00    Types: Cigarettes    Last attempt to quit: 08/08/2018    Years since quitting: 0.0  . Smokeless tobacco: Never Used  Substance Use Topics  . Alcohol use: Yes    Comment: social  . Drug use: Yes    Types: Marijuana    Comment: Occassionally    Allergies as of 08/30/2018 - Review Complete 08/30/2018  Allergen Reaction Noted  . Latex Other (See Comments) 05/19/2018    Review of Systems:    All systems reviewed and negative except where noted in HPI.   Physical Exam:  BP 116/81 (BP Location: Left Arm, Patient Position: Sitting, Cuff Size:  Large)   Pulse 74   Resp 16   Ht 5\' 6"  (1.676 m)   Wt 271 lb 9.6 oz (123.2 kg)   BMI 43.84 kg/m  No LMP recorded.  General:   Alert, obese, Well-developed, well-nourished, pleasant and cooperative in NAD Head:  Normocephalic and atraumatic. Eyes:  Sclera clear, no icterus.   Conjunctiva pink. Ears:  Normal auditory acuity. Nose:  No deformity, discharge, or lesions. Mouth:  No deformity or lesions,oropharynx pink & moist. Neck:  Supple; no masses or thyromegaly. Lungs:  Respirations even and unlabored.  Clear throughout to auscultation.   No wheezes, crackles, or rhonchi. No acute distress. Heart:  Regular rate and rhythm; no murmurs, clicks, rubs, or gallops. Abdomen:  Normal bowel sounds. Soft, obese, non-tender and non-distended, limited abdominal exam due to body habitus.  No guarding or rebound tenderness.   Rectal: Not performed Msk:  Symmetrical without gross deformities. Good, equal movement & strength bilaterally. Pulses:  Normal pulses noted. Extremities:  No clubbing or edema.  No  cyanosis. Neurologic:  Alert and oriented x3;  grossly normal neurologically. Skin:  Intact without significant lesions or rashes. No jaundice. Lymph Nodes:  No significant cervical adenopathy. Psych:  Alert and cooperative. Normal mood and affect.  Imaging Studies: reviewed  Assessment and Plan:   Alaia Lordi is a 27 y.o. female with morbid obesity, with approximately 2 months history of nonbloody diarrhea, GI pathogen panel negative, recurrent C. Difficile infection, post treatment with 2 weeks course of vancomycin and 10 days course of fidaxomycin. CT with nonspecific thickening of ascending colon. Currently, she is experiencing intermittent loose stools associated with abdominal cramps.   - Recheck fecal calprotectin - Continue Bentyl as needed for abdominal cramps - Trial of probiotics, VSL#3 - Start curcumin capsules 2pills twice daily - I have discussed with her about colonoscopy or flexible sigmoidoscopy during second trimester which is overall safe to undergo anesthesia if her diarrhea persists and fecal calprotectin is elevated   Follow up in 2 months  Arlyss Repress, MD

## 2018-08-30 NOTE — Patient Instructions (Addendum)
1. Continue bentyl 10mg  as needed for cramps 2. Start VSL#3 probiotic, twice daily 3. Start curcumin capsules 2pills twice daily 4. Check fecal calprotectin 5. F/u in 2months  Please call our office to speak with my nurse Ulyses Jarredemeka Green [phone] during business hours from 8am to 4pm if you have any questions/concerns. During after hours, you will be redirected to on call GI physician. For any emergency please call 911 or go the nearest emergency room.    Arlyss Repressohini R Thijs Brunton, MD [address]  [address]  [place], [place] [address]  Main: [phone]  Fax: [phone]

## 2018-09-02 LAB — CALPROTECTIN, FECAL: Calprotectin, Fecal: 87 ug/g (ref 0–120)

## 2018-09-25 DIAGNOSIS — Z2839 Other underimmunization status: Secondary | ICD-10-CM | POA: Insufficient documentation

## 2018-09-25 DIAGNOSIS — Z283 Underimmunization status: Secondary | ICD-10-CM | POA: Insufficient documentation

## 2018-11-03 DIAGNOSIS — R002 Palpitations: Secondary | ICD-10-CM | POA: Insufficient documentation

## 2018-11-08 ENCOUNTER — Encounter: Payer: Self-pay | Admitting: Gastroenterology

## 2018-11-08 ENCOUNTER — Other Ambulatory Visit: Payer: Self-pay

## 2018-11-08 ENCOUNTER — Ambulatory Visit: Payer: Medicaid Other | Admitting: Gastroenterology

## 2018-11-08 DIAGNOSIS — A0471 Enterocolitis due to Clostridium difficile, recurrent: Secondary | ICD-10-CM

## 2019-01-18 DIAGNOSIS — F419 Anxiety disorder, unspecified: Secondary | ICD-10-CM | POA: Insufficient documentation

## 2019-03-02 ENCOUNTER — Telehealth: Payer: Self-pay | Admitting: Gastroenterology

## 2019-03-02 DIAGNOSIS — A0471 Enterocolitis due to Clostridium difficile, recurrent: Secondary | ICD-10-CM

## 2019-03-02 NOTE — Telephone Encounter (Signed)
Patient is [redacted] wks pregnant and states DR [facility] wants a test done to confirm that she no long has C-Diff. She was last seen in our office on 08-2018.

## 2019-03-07 ENCOUNTER — Other Ambulatory Visit: Payer: Self-pay

## 2019-03-07 DIAGNOSIS — A0471 Enterocolitis due to Clostridium difficile, recurrent: Secondary | ICD-10-CM

## 2019-03-07 NOTE — Telephone Encounter (Signed)
Labs have been ordered, pt has been notified and verbalized understanding

## 2019-03-13 DIAGNOSIS — E669 Obesity, unspecified: Secondary | ICD-10-CM | POA: Insufficient documentation

## 2019-04-03 DIAGNOSIS — F17200 Nicotine dependence, unspecified, uncomplicated: Secondary | ICD-10-CM | POA: Insufficient documentation

## 2019-04-03 DIAGNOSIS — F17210 Nicotine dependence, cigarettes, uncomplicated: Secondary | ICD-10-CM | POA: Insufficient documentation

## 2019-07-28 ENCOUNTER — Other Ambulatory Visit: Payer: Self-pay

## 2019-07-28 ENCOUNTER — Ambulatory Visit: Payer: Medicaid Other | Admitting: Gastroenterology

## 2019-08-23 ENCOUNTER — Encounter: Payer: Self-pay | Admitting: *Deleted

## 2019-08-23 ENCOUNTER — Ambulatory Visit: Payer: Medicaid Other | Admitting: Gastroenterology

## 2020-01-27 IMAGING — CT CT RENAL STONE PROTOCOL
2 of 4 series · 16 of 46 positions shown, 18 images · non-contrast
Comparison: None.

CLINICAL DATA: Right flank pain. Dysuria. Diarrhea with recent
antibiotics.

EXAM:
CT ABDOMEN AND PELVIS WITHOUT CONTRAST
TECHNIQUE: Multidetector CT imaging of the abdomen and pelvis was performed
following the standard protocol without IV contrast.

[Series 2: stone full standard · axial · 0.81mm/px · z∈[-545,-105]mm · 13 of 98 slices shown, 15 images]
[im 5/98  soft-tissue]
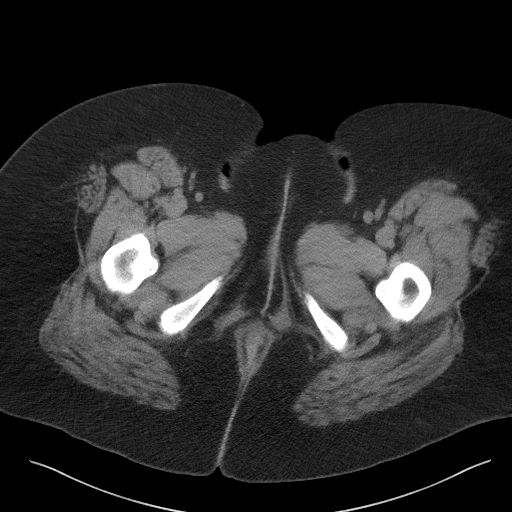
[im 5/98  bone]
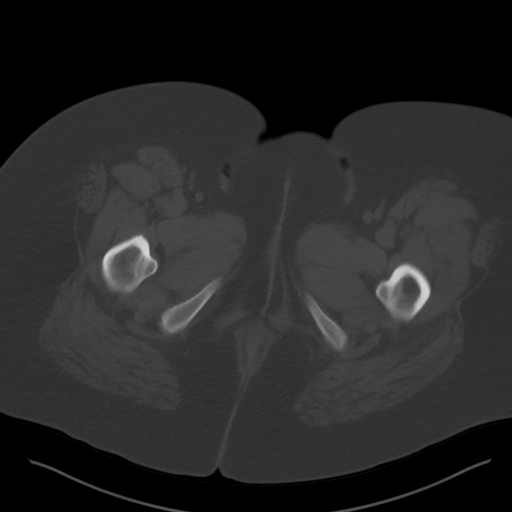
[im 14/98  soft-tissue]
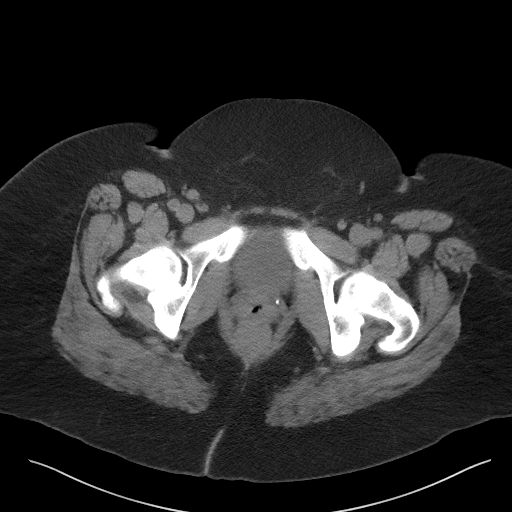
[im 23/98  soft-tissue]
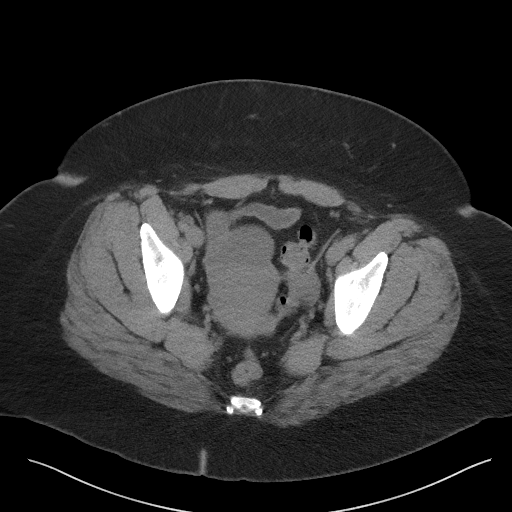
[im 27/98  soft-tissue]
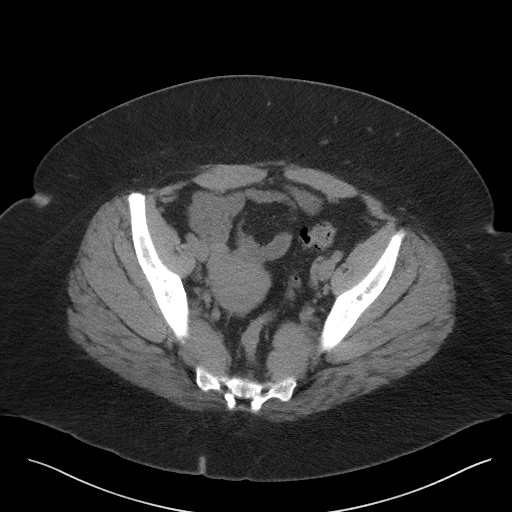
[im 36/98  soft-tissue]
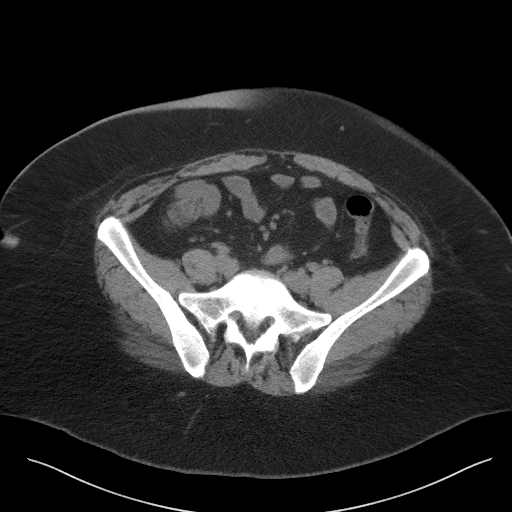
[im 40/98  soft-tissue]
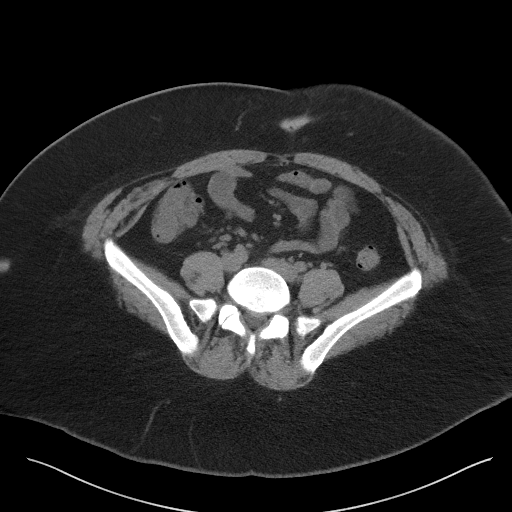
[im 49/98  soft-tissue]
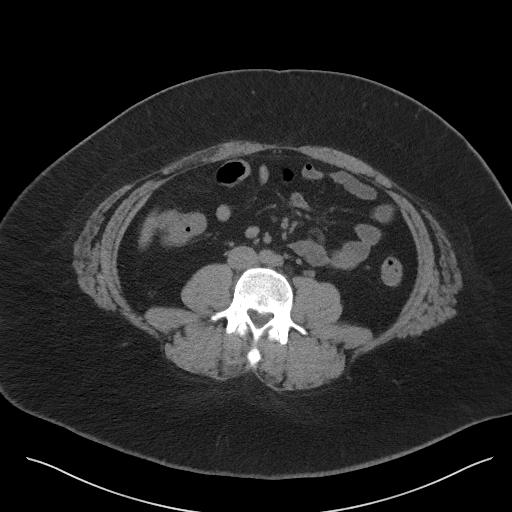
[im 58/98  soft-tissue]
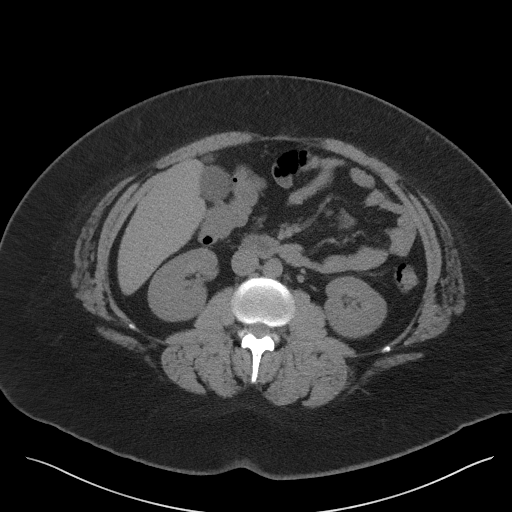
[im 62/98  soft-tissue]
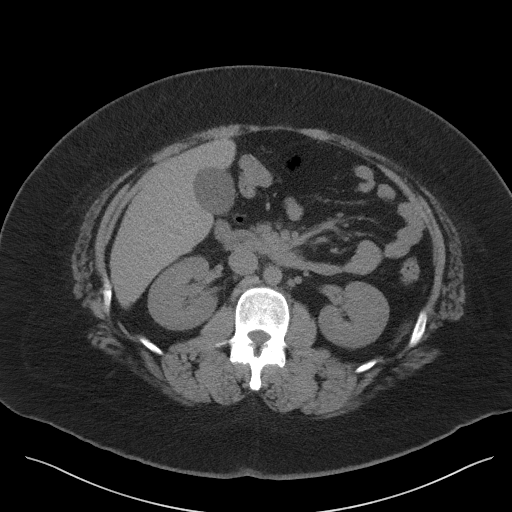
[im 62/98  bone]
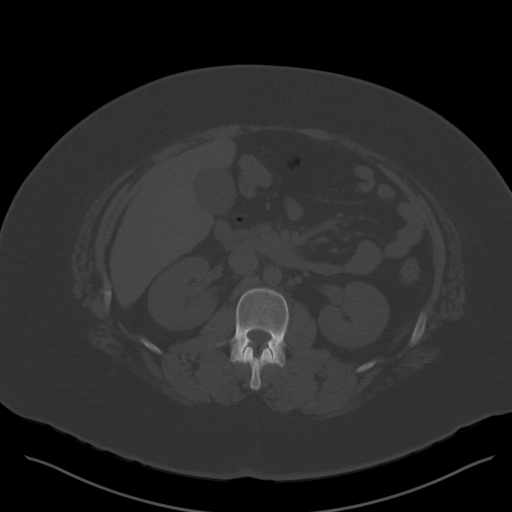
[im 71/98  soft-tissue]
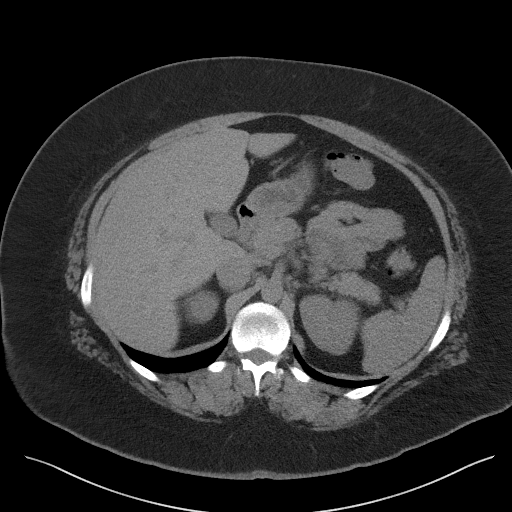
[im 75/98  soft-tissue]
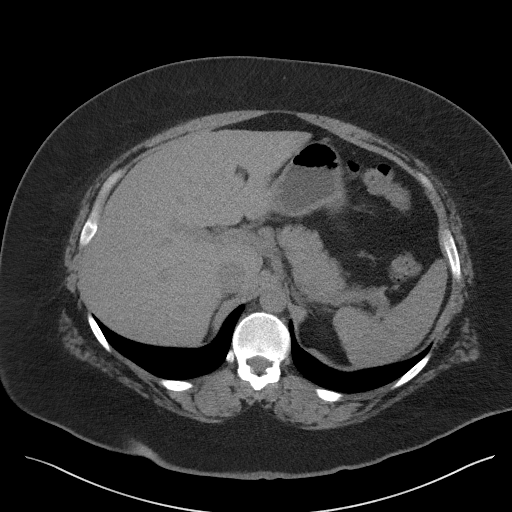
[im 84/98  soft-tissue]
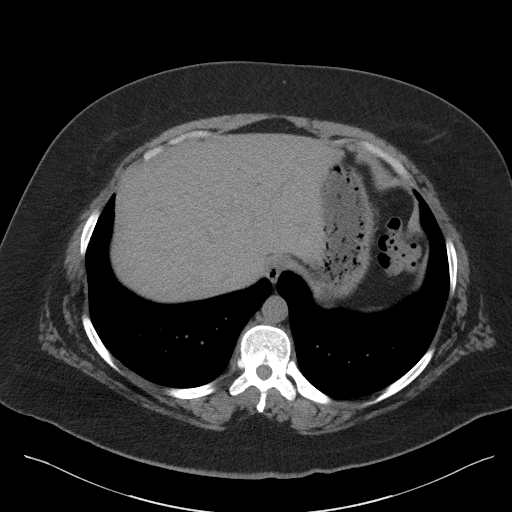
[im 93/98  soft-tissue]
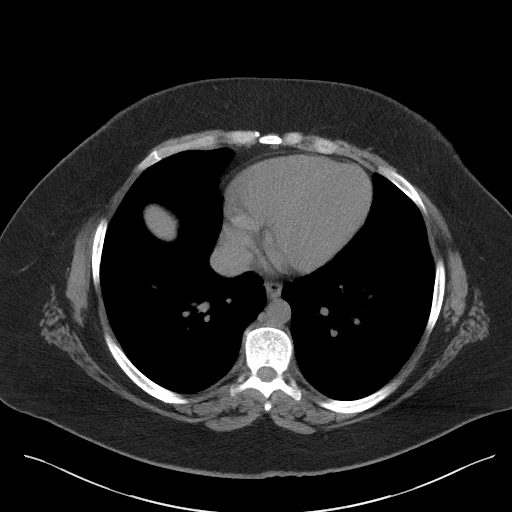

[Series 5: coronal · coronal · 0.79mm/px · 3 of 158 slices shown]
[im 53/158  soft-tissue]
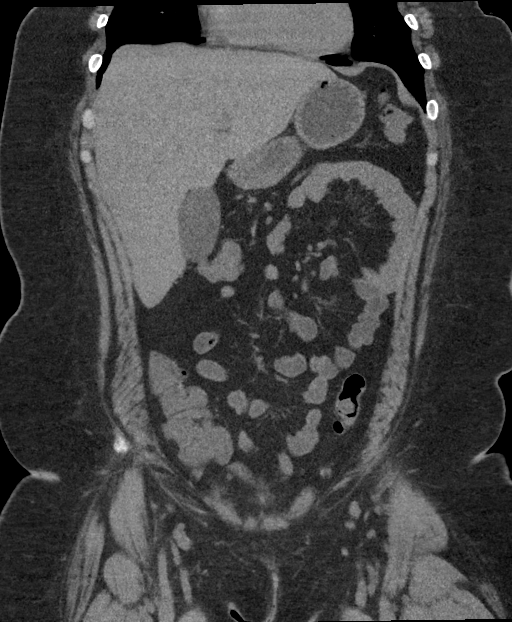
[im 70/158  soft-tissue]
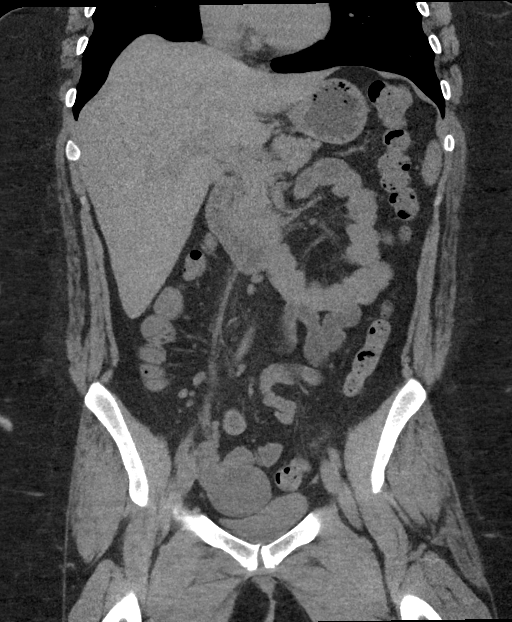
[im 88/158  soft-tissue]
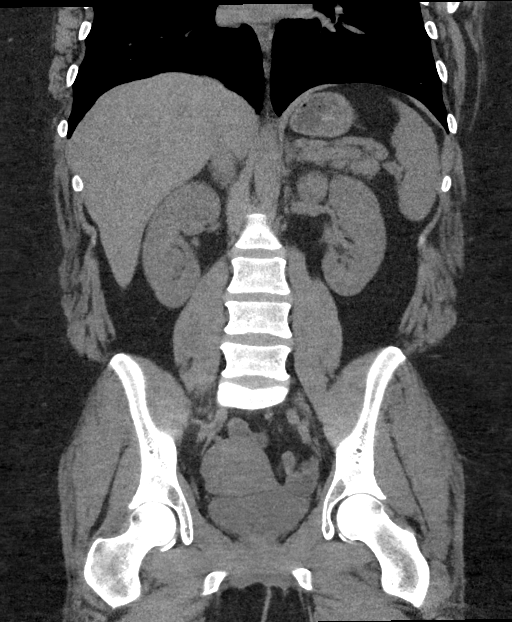

[16 of 46 positions shown; findings below may reference images not displayed]

FINDINGS: Lower chest: Lung bases are clear.

Hepatobiliary: Elongated right lobe of the liver spanning 22 cm. No
discrete focal lesion on noncontrast exam. Gallbladder
physiologically distended, no calcified stone. No biliary
dilatation.

Pancreas: No ductal dilatation or inflammation.

Spleen: Normal in size without focal abnormality.

Adrenals/Urinary Tract: Normal adrenal glands. Kidneys are symmetric
in size without stones or hydronephrosis. No perinephric edema. No
focal renal abnormality on noncontrast exam. Both ureters are
decompressed without stones along the course. Urinary bladder is
nondistended, no bladder stone.

Stomach/Bowel: Normal appendix, for example coronal image 70 series
5. Equivocal liquid stool or wall thickening of the ascending colon
without pericolonic inflammatory change. Small volume of stool in
the remainder the colon. No small bowel inflammation, wall
thickening or obstruction. Terminal ileum is normal. Stomach is
physiologically distended.

Vascular/Lymphatic: Normal caliber abdominal aorta. Few prominent
ileocolic nodes, likely reactive. No enlarged lymph nodes in the
abdomen or pelvis.

Reproductive: There is a 5.3 cm right ovarian cyst that is
homogeneous in density. Left ovary is normal in size. The unenhanced
uterus is unremarkable.

Other: No free air, free fluid, or intra-abdominal fluid collection.
Small fat containing umbilical hernia.

Musculoskeletal: There are no acute or suspicious osseous
abnormalities.
IMPRESSION: 1. No renal stones or obstructive uropathy.
2. Equivocal liquid stool or mild colonic wall thickening of the
ascending colon, which can be seen with diarrheal process. No
pericolonic inflammatory change.
3. Right ovarian cyst measuring 5.3 cm, with benign CT imaging
characteristics. Recommend ultrasound follow-up at 6-12 weeks. This
recommendation follows ACR consensus guidelines: White Paper of the
ACR Incidental Findings Committee II on Adnexal Findings. [HOSPITAL] [DATE].

## 2020-02-07 ENCOUNTER — Ambulatory Visit (INDEPENDENT_AMBULATORY_CARE_PROVIDER_SITE_OTHER): Payer: Medicaid Other | Admitting: Family Medicine

## 2020-02-07 ENCOUNTER — Other Ambulatory Visit: Payer: Self-pay

## 2020-02-07 ENCOUNTER — Encounter: Payer: Self-pay | Admitting: Family Medicine

## 2020-02-07 VITALS — BP 114/80 | HR 77 | Temp 98.4°F | Ht 66.0 in | Wt 269.0 lb

## 2020-02-07 DIAGNOSIS — B078 Other viral warts: Secondary | ICD-10-CM | POA: Diagnosis not present

## 2020-02-07 DIAGNOSIS — N898 Other specified noninflammatory disorders of vagina: Secondary | ICD-10-CM

## 2020-02-07 DIAGNOSIS — M549 Dorsalgia, unspecified: Secondary | ICD-10-CM | POA: Diagnosis not present

## 2020-02-07 DIAGNOSIS — N760 Acute vaginitis: Secondary | ICD-10-CM

## 2020-02-07 DIAGNOSIS — B9689 Other specified bacterial agents as the cause of diseases classified elsewhere: Secondary | ICD-10-CM

## 2020-02-07 LAB — UA/M W/RFLX CULTURE, ROUTINE
Bilirubin, UA: NEGATIVE
Glucose, UA: NEGATIVE
Ketones, UA: NEGATIVE
Leukocytes,UA: NEGATIVE
Nitrite, UA: NEGATIVE
Protein,UA: NEGATIVE
RBC, UA: NEGATIVE
Specific Gravity, UA: 1.015 (ref 1.005–1.030)
Urobilinogen, Ur: 0.2 mg/dL (ref 0.2–1.0)
pH, UA: 6 (ref 5.0–7.5)

## 2020-02-07 LAB — WET PREP FOR TRICH, YEAST, CLUE
Clue Cell Exam: POSITIVE — AB
Trichomonas Exam: NEGATIVE
Yeast Exam: NEGATIVE

## 2020-02-07 MED ORDER — CLINDAMYCIN HCL 150 MG PO CAPS: 150.0000 mg | ORAL_CAPSULE | Freq: Two times a day (BID) | ORAL | 0 refills | Status: DC

## 2020-02-07 NOTE — Progress Notes (Signed)
BP 114/80   Pulse 77   Temp 98.4 F (36.9 C) (Oral)   Ht 5\' 6"  (1.676 m)   Wt 269 lb (122 kg)   SpO2 98%   BMI 43.42 kg/m    Subjective:    Patient ID: , female    DOB: Nov 22, 1991, 29 y.o.   MRN: [id number]  HPI: Samantha Villegas is a 29 y.o. female  Chief Complaint  Patient presents with  . Back Pain    lower back and right side pain x about 2 months  . Vaginal Itching   Right sided low back pain the past 2 months. B/l at times but mostly right flank. Some days constant, some days intermittent. Typically sharp pain. Trying hot baths without relief. No known injury, no fevers, chills, urinary sxs. Hx of right ovarian cysts and states this feels similar. No concern for pregnancy, has IUD in place and negative home preg test.   Right pinky finger wart next to tip of nail. Has tried filing it down which only seemed to make it spread bigger. Becoming painful and now affecting nail shape.   Relevant past medical, surgical, family and social history reviewed and updated as indicated. Interim medical history since our last visit reviewed. Allergies and medications reviewed and updated.  Review of Systems  Per HPI unless specifically indicated above     Objective:    BP 114/80   Pulse 77   Temp 98.4 F (36.9 C) (Oral)   Ht 5\' 6"  (1.676 m)   Wt 269 lb (122 kg)   SpO2 98%   BMI 43.42 kg/m   Wt Readings from Last 3 Encounters:  02/07/20 269 lb (122 kg)  08/30/18 271 lb 9.6 oz (123.2 kg)  08/10/18 272 lb 12.8 oz (123.7 kg)    Physical Exam Vitals and nursing note reviewed.  Constitutional:      Appearance: Normal appearance. She is not ill-appearing.  HENT:     Head: Atraumatic.  Eyes:     Extraocular Movements: Extraocular movements intact.     Conjunctiva/sclera: Conjunctivae normal.  Cardiovascular:     Rate and Rhythm: Normal rate and regular rhythm.     Heart sounds: Normal heart sounds.  Pulmonary:     Effort: Pulmonary effort is  normal.     Breath sounds: Normal breath sounds.  Abdominal:     General: Bowel sounds are normal. There is no distension.     Palpations: Abdomen is soft. There is no mass.     Tenderness: There is abdominal tenderness (mild right lower abdominal ttp).  Musculoskeletal:        General: Normal range of motion.     Cervical back: Normal range of motion and neck supple.  Skin:    General: Skin is warm and dry.     Comments: Wart present distal tip of right 5th digit  Neurological:     Mental Status: She is alert and oriented to person, place, and time.  Psychiatric:        Mood and Affect: Mood normal.        Thought Content: Thought content normal.        Judgment: Judgment normal.     Results for orders placed or performed in visit on 02/07/20  WET PREP FOR TRICH, YEAST, CLUE   Specimen: Urine   URINE  Result Value Ref Range   Trichomonas Exam Negative Negative   Yeast Exam Negative Negative   Clue Cell Exam Positive (  A) Negative  UA/M w/rflx Culture, Routine   Specimen: Urine   URINE  Result Value Ref Range   Specific Gravity, UA 1.015 1.005 - 1.030   pH, UA 6.0 5.0 - 7.5   Color, UA Yellow Yellow   Appearance Ur Clear Clear   Leukocytes,UA Negative Negative   Protein,UA Negative Negative/Trace   Glucose, UA Negative Negative   Ketones, UA Negative Negative   RBC, UA Negative Negative   Bilirubin, UA Negative Negative   Urobilinogen, Ur 0.2 0.2 - 1.0 mg/dL   Nitrite, UA Negative Negative      Assessment & Plan:   Problem List Items Addressed This Visit    None    Visit Diagnoses    BV (bacterial vaginosis)    -  Primary   Unclear how much of her abdominal pain is from this infection, monitor closely for improvement, u/s if not or worsening   Relevant Medications   clindamycin (CLEOCIN) 150 MG capsule   Back pain, unspecified back location, unspecified back pain laterality, unspecified chronicity       Unclear if from BV or an ovarian cyst, will perform u/s  if no improvement on clindamycin regimen for BV. Push fluids   Vaginal itching       Relevant Orders   WET PREP FOR [place], YEAST, CLUE (Completed)   Other viral warts       Right 5th finger, discussed filing and applying salycilic acid, covering with duct tape, and referring to Dermatology for more aggressive tx. Referral generate   Relevant Medications   clindamycin (CLEOCIN) 150 MG capsule   Other Relevant Orders   Ambulatory referral to Dermatology       Follow up plan: No follow-ups on file.

## 2020-02-23 ENCOUNTER — Encounter: Payer: Self-pay | Admitting: Family Medicine

## 2020-03-07 ENCOUNTER — Other Ambulatory Visit: Payer: Self-pay | Admitting: Family Medicine

## 2020-03-07 NOTE — Telephone Encounter (Signed)
Requested medication (s) are due for refill today: Yes  Requested medication (s) are on the active medication list: Yes  Last refill:  08/10/18  Future visit scheduled: No  Notes to clinic:  Prescription has expired.    Requested Prescriptions  Pending Prescriptions Disp Refills   PROVENTIL HFA 108 (90 Base) MCG/ACT inhaler [Pharmacy Med Name: PROVENTIL HFA 90 MCG INHALER]  1    Sig: TAKE 2 PUFFS BY MOUTH EVERY 6 HOURS AS NEEDED FOR WHEEZE OR SHORTNESS OF BREATH      Pulmonology:  Beta Agonists Failed - 03/07/2020  3:30 PM      Failed - One inhaler should last at least one month. If the patient is requesting refills earlier, contact the patient to check for uncontrolled symptoms.      Passed - Valid encounter within last 12 months    Recent Outpatient Visits           4 weeks ago BV (bacterial vaginosis)   [facility] Particia Nearing, [place]   1 year ago Viral URI with cough   [facility] Particia Nearing, [place]   1 year ago Dysuria   [facility] Gabriel Cirri, NP   1 year ago Diarrhea of presumed infectious origin   [facility] Gabriel Cirri, NP   1 year ago Recurrent boils   [facility], Salley Hews, [place]       Future Appointments             In 1 month Gwen Pounds Dineen Kid, MD [facility]

## 2020-03-14 ENCOUNTER — Ambulatory Visit: Payer: Medicaid Other | Admitting: Family Medicine

## 2020-03-20 ENCOUNTER — Ambulatory Visit (INDEPENDENT_AMBULATORY_CARE_PROVIDER_SITE_OTHER): Payer: Medicaid Other | Admitting: Family Medicine

## 2020-03-20 ENCOUNTER — Other Ambulatory Visit: Payer: Self-pay

## 2020-03-20 ENCOUNTER — Encounter: Payer: Self-pay | Admitting: Family Medicine

## 2020-03-20 VITALS — BP 109/79 | HR 76 | Temp 98.5°F | Wt 266.0 lb

## 2020-03-20 DIAGNOSIS — R197 Diarrhea, unspecified: Secondary | ICD-10-CM

## 2020-03-20 DIAGNOSIS — R109 Unspecified abdominal pain: Secondary | ICD-10-CM | POA: Diagnosis not present

## 2020-03-20 LAB — UA/M W/RFLX CULTURE, ROUTINE
Bilirubin, UA: NEGATIVE
Glucose, UA: NEGATIVE
Ketones, UA: NEGATIVE
Leukocytes,UA: NEGATIVE
Nitrite, UA: NEGATIVE
Protein,UA: NEGATIVE
RBC, UA: NEGATIVE
Specific Gravity, UA: 1.015 (ref 1.005–1.030)
Urobilinogen, Ur: 0.2 mg/dL (ref 0.2–1.0)
pH, UA: 5.5 (ref 5.0–7.5)

## 2020-03-20 MED ORDER — PANTOPRAZOLE SODIUM 40 MG PO TBEC: 40.0000 mg | DELAYED_RELEASE_TABLET | Freq: Every day | ORAL | 1 refills | Status: DC

## 2020-03-20 NOTE — Progress Notes (Signed)
BP 109/79   Pulse 76   Temp 98.5 F (36.9 C) (Oral)   Wt 266 lb (120.7 kg)   SpO2 97%   BMI 42.93 kg/m    Subjective:    Patient ID: Samantha Villegas, female    DOB: Nov 12, 1991, 29 y.o.   MRN: [id number]  HPI: Samantha Villegas is a 29 y.o. female  Chief Complaint  Patient presents with  . Back Pain    pt states she's not feeling better   Still dealing with right upper flank pain the past few months, no improvement with treatment of BV. Sometimes pain feels like it's in the center of upper abdomen, other times it's radiating toward flank. Denies fever, chills, N/V, melena. Does have frequent diarrhea, but this has not been new since this pain started. Unsure if anything makes it better or worse. Hx of right ovarian cyst but does not feel this is in the same location of pain. No urinary sxs currently.  Requesting to go back to GI, had been following for her diarrhea over a year ago but needs to re-establish.   Relevant past medical, surgical, family and social history reviewed and updated as indicated. Interim medical history since our last visit reviewed. Allergies and medications reviewed and updated.  Review of Systems  Per HPI unless specifically indicated above     Objective:    BP 109/79   Pulse 76   Temp 98.5 F (36.9 C) (Oral)   Wt 266 lb (120.7 kg)   SpO2 97%   BMI 42.93 kg/m   Wt Readings from Last 3 Encounters:  03/20/20 266 lb (120.7 kg)  02/07/20 269 lb (122 kg)  08/30/18 271 lb 9.6 oz (123.2 kg)    Physical Exam Vitals and nursing note reviewed.  Constitutional:      Appearance: Normal appearance. She is not ill-appearing.  HENT:     Head: Atraumatic.  Eyes:     Extraocular Movements: Extraocular movements intact.     Conjunctiva/sclera: Conjunctivae normal.  Cardiovascular:     Rate and Rhythm: Normal rate and regular rhythm.     Heart sounds: Normal heart sounds.  Pulmonary:     Effort: Pulmonary effort is normal.     Breath  sounds: Normal breath sounds.  Abdominal:     General: Bowel sounds are normal. There is no distension.     Palpations: Abdomen is soft.     Tenderness: There is abdominal tenderness (mild epigastric and RUQ ttp). There is no right CVA tenderness, left CVA tenderness or guarding.  Musculoskeletal:        General: Normal range of motion.     Cervical back: Normal range of motion and neck supple.  Skin:    General: Skin is warm and dry.  Neurological:     Mental Status: She is alert and oriented to person, place, and time.  Psychiatric:        Mood and Affect: Mood normal.        Thought Content: Thought content normal.        Judgment: Judgment normal.     Results for orders placed or performed in visit on 03/20/20  CBC with Differential/Platelet  Result Value Ref Range   WBC 9.8 3.4 - 10.8 x10E3/uL   RBC 4.98 3.77 - 5.28 x10E6/uL   Hemoglobin 14.4 11.1 - 15.9 g/dL   Hematocrit 43.1 34.0 - 46.6 %   MCV 87 79 - 97 fL   MCH 28.9 26.6 - 33.0  pg   MCHC 33.4 31.5 - 35.7 g/dL   RDW 76.7 20.9 - 47.0 %   Platelets 317 150 - 450 x10E3/uL   Neutrophils 59 Not Estab. %   Lymphs 30 Not Estab. %   Monocytes 6 Not Estab. %   Eos 4 Not Estab. %   Basos 1 Not Estab. %   Neutrophils Absolute 5.9 1.4 - 7.0 x10E3/uL   Lymphocytes Absolute 2.9 0.7 - 3.1 x10E3/uL   Monocytes Absolute 0.6 0.1 - 0.9 x10E3/uL   EOS (ABSOLUTE) 0.4 0.0 - 0.4 x10E3/uL   Basophils Absolute 0.1 0.0 - 0.2 x10E3/uL   Immature Granulocytes 0 Not Estab. %   Immature Grans (Abs) 0.0 0.0 - 0.1 x10E3/uL  Comprehensive metabolic panel  Result Value Ref Range   Glucose 91 65 - 99 mg/dL   BUN 6 6 - 20 mg/dL   Creatinine, Ser 9.62 0.57 - 1.00 mg/dL   GFR calc non Af Amer 119 >59 mL/min/1.73   GFR calc Af Amer 137 >59 mL/min/1.73   BUN/Creatinine Ratio 9 9 - 23   Sodium 141 134 - 144 mmol/L   Potassium 4.5 3.5 - 5.2 mmol/L   Chloride 106 96 - 106 mmol/L   CO2 22 20 - 29 mmol/L   Calcium 9.3 8.7 - 10.2 mg/dL   Total  Protein 7.0 6.0 - 8.5 g/dL   Albumin 4.2 3.9 - 5.0 g/dL   Globulin, Total 2.8 1.5 - 4.5 g/dL   Albumin/Globulin Ratio 1.5 1.2 - 2.2   Bilirubin Total 0.2 0.0 - 1.2 mg/dL   Alkaline Phosphatase 96 39 - 117 IU/L   AST 14 0 - 40 IU/L   ALT 20 0 - 32 IU/L  Lipase  Result Value Ref Range   Lipase 28 14 - 72 U/L  UA/M w/rflx Culture, Routine   Specimen: Urine   URINE  Result Value Ref Range   Specific Gravity, UA 1.015 1.005 - 1.030   pH, UA 5.5 5.0 - 7.5   Color, UA Yellow Yellow   Appearance Ur Clear Clear   Leukocytes,UA Negative Negative   Protein,UA Negative Negative/Trace   Glucose, UA Negative Negative   Ketones, UA Negative Negative   RBC, UA Negative Negative   Bilirubin, UA Negative Negative   Urobilinogen, Ur 0.2 0.2 - 1.0 mg/dL   Nitrite, UA Negative Negative      Assessment & Plan:   Problem List Items Addressed This Visit    None    Visit Diagnoses    Right flank pain    -  Primary   WIll draw labs and urine, await results. Obtain abdominal u/s for further evaulation on nonspecific pain sxs. Continue to monitor, return precautions reviewed   Relevant Orders   CBC with Differential/Platelet (Completed)   Comprehensive metabolic panel (Completed)   Lipase (Completed)   US Abdomen Complete   UA/M w/rflx Culture, Routine (Completed)   Diarrhea, unspecified type       Referral back to GI placed per patient request   Relevant Orders   Ambulatory referral to Gastroenterology       Follow up plan: Return if symptoms worsen or fail to improve.

## 2020-03-21 LAB — CBC WITH DIFFERENTIAL/PLATELET
Basophils Absolute: 0.1 10*3/uL (ref 0.0–0.2)
Basos: 1 %
EOS (ABSOLUTE): 0.4 10*3/uL (ref 0.0–0.4)
Eos: 4 %
Hematocrit: 43.1 % (ref 34.0–46.6)
Hemoglobin: 14.4 g/dL (ref 11.1–15.9)
Immature Grans (Abs): 0 10*3/uL (ref 0.0–0.1)
Immature Granulocytes: 0 %
Lymphocytes Absolute: 2.9 10*3/uL (ref 0.7–3.1)
Lymphs: 30 %
MCH: 28.9 pg (ref 26.6–33.0)
MCHC: 33.4 g/dL (ref 31.5–35.7)
MCV: 87 fL (ref 79–97)
Monocytes Absolute: 0.6 10*3/uL (ref 0.1–0.9)
Monocytes: 6 %
Neutrophils Absolute: 5.9 10*3/uL (ref 1.4–7.0)
Neutrophils: 59 %
Platelets: 317 10*3/uL (ref 150–450)
RBC: 4.98 x10E6/uL (ref 3.77–5.28)
RDW: 13.6 % (ref 11.7–15.4)
WBC: 9.8 10*3/uL (ref 3.4–10.8)

## 2020-03-21 LAB — COMPREHENSIVE METABOLIC PANEL
ALT: 20 IU/L (ref 0–32)
AST: 14 IU/L (ref 0–40)
Albumin/Globulin Ratio: 1.5 (ref 1.2–2.2)
Albumin: 4.2 g/dL (ref 3.9–5.0)
Alkaline Phosphatase: 96 IU/L (ref 39–117)
BUN/Creatinine Ratio: 9 (ref 9–23)
BUN: 6 mg/dL (ref 6–20)
Bilirubin Total: 0.2 mg/dL (ref 0.0–1.2)
CO2: 22 mmol/L (ref 20–29)
Calcium: 9.3 mg/dL (ref 8.7–10.2)
Chloride: 106 mmol/L (ref 96–106)
Creatinine, Ser: 0.66 mg/dL (ref 0.57–1.00)
GFR calc Af Amer: 137 mL/min/{1.73_m2} (ref >59)
GFR calc non Af Amer: 119 mL/min/{1.73_m2} (ref >59)
Globulin, Total: 2.8 g/dL (ref 1.5–4.5)
Glucose: 91 mg/dL (ref 65–99)
Potassium: 4.5 mmol/L (ref 3.5–5.2)
Sodium: 141 mmol/L (ref 134–144)
Total Protein: 7 g/dL (ref 6.0–8.5)

## 2020-03-21 LAB — LIPASE: Lipase: 28 U/L (ref 14–72)

## 2020-03-26 ENCOUNTER — Encounter: Payer: Self-pay | Admitting: Gastroenterology

## 2020-03-26 ENCOUNTER — Other Ambulatory Visit: Payer: Self-pay

## 2020-03-26 ENCOUNTER — Ambulatory Visit
Admission: RE | Admit: 2020-03-26 | Discharge: 2020-03-26 | Disposition: A | Discharge status: Home / Self Care | Payer: Medicaid Other | Source: Ambulatory Visit | Attending: Family Medicine | Admitting: Family Medicine

## 2020-03-26 DIAGNOSIS — R109 Unspecified abdominal pain: Secondary | ICD-10-CM | POA: Insufficient documentation

## 2020-03-27 ENCOUNTER — Telehealth (INDEPENDENT_AMBULATORY_CARE_PROVIDER_SITE_OTHER): Payer: Medicaid Other | Admitting: Gastroenterology

## 2020-03-27 ENCOUNTER — Other Ambulatory Visit: Payer: Self-pay

## 2020-03-27 ENCOUNTER — Encounter: Payer: Self-pay | Admitting: Gastroenterology

## 2020-03-27 DIAGNOSIS — R197 Diarrhea, unspecified: Secondary | ICD-10-CM

## 2020-03-27 DIAGNOSIS — Z8619 Personal history of other infectious and parasitic diseases: Secondary | ICD-10-CM

## 2020-03-27 MED ORDER — DICYCLOMINE HCL 10 MG PO CAPS: 10.0000 mg | ORAL_CAPSULE | Freq: Three times a day (TID) | ORAL | 0 refills | Status: DC

## 2020-03-27 NOTE — Progress Notes (Signed)
Samantha Sear, MD [address]  [place]  [name], [place] [address]  Main: [phone]  Fax: [phone]    Gastroenterology Consultation Video Visit  Referring Provider:     Volney Villegas,* Primary Care Physician:  Samantha American, PA-C Primary Gastroenterologist:  Dr. Cephas Villegas Reason for Consultation:     Increased bowel frequency        HPI:   Samantha Villegas is a 29 y.o. female referred by Dr. Orene Villegas, Samantha Argue, PA-C  for consultation & management of diarrhea  Virtual Visit Video Note  I connected with Samantha Villegas on 03/27/20 at 10:45 AM EDT by video and verified that I am speaking with the correct person using two identifiers.   I discussed the limitations, risks, security and privacy concerns of performing an evaluation and management service by video and the availability of in person appointments. I also discussed with the patient that there may be a patient responsible charge related to this service. The patient expressed understanding and agreed to proceed.  Location of the Patient: Home  Location of the provider: Office  Persons participating in the visit: Patient and provider only   History of Present Illness: Samantha Villegas has 1 year history of nonbloody diarrhea.  She was last seen by me during her second trimester of pregnancy.  She had history of C. difficile which was treated in the past.  Her pregnancy was uneventful.  She is concerned about ongoing nonbloody diarrhea, 3-4 times a day associated with abdominal cramps and bloating.  She denies drinking carbonated beverages.  She is now at her prepregnancy weight.  Her most recent labs are unremarkable.    NSAIDs: None  Antiplts/Anticoagulants/Anti thrombotics: None  GI Procedures: None  Past Medical History:  Diagnosis Date  . Anxiety    no meds  . Asthma    h/o no inhalers  . GERD (gastroesophageal reflux disease)    no meds  . History of itching     . Pyelonephritis   . Skipped heart beats    pt states this happens occ but is asymptomatic    Past Surgical History:  Procedure Laterality Date  . CESAREAN SECTION    . LAPAROSCOPIC OVARIAN CYSTECTOMY Right 05/26/2018   Procedure: LAPAROSCOPIC RIGHT OVARIAN CYSTECTOMY;  Surgeon: Samantha Fellers, MD;  Location: [facility] ORS;  Service: Gynecology;  Laterality: Right;    Current Outpatient Medications:  .  acetaminophen (TYLENOL) 500 MG tablet, Take 1,000 mg by mouth every 6 (six) hours as needed (for pain.)., Disp: , Rfl:  .  fluticasone (FLONASE) 50 MCG/ACT nasal spray, Place 2 sprays into both nostrils 2 (two) times daily. (Patient taking differently: Place 2 sprays into both nostrils 2 (two) times daily as needed for allergies. ), Disp: 16 g, Rfl: 11 .  ibuprofen (ADVIL) 600 MG tablet, TAKE 1 TABLET (600 MG TOTAL) BY MOUTH EVERY SIX (6) HOURS., Disp: , Rfl:  .  montelukast (SINGULAIR) 5 MG chewable tablet, Chew 2 tablets (10 mg total) by mouth at bedtime. (Patient taking differently: Chew 10 mg by mouth at bedtime as needed (for allergies.). ), Disp: 60 tablet, Rfl: 11 .  pantoprazole (PROTONIX) 40 MG tablet, Take 1 tablet (40 mg total) by mouth daily., Disp: 30 tablet, Rfl: 1 .  PROVENTIL HFA 108 (90 Base) MCG/ACT inhaler, TAKE 2 PUFFS BY MOUTH EVERY 6 HOURS AS NEEDED FOR WHEEZE OR SHORTNESS OF BREATH, Disp: 18 g, Rfl: 1 .  dicyclomine (BENTYL) 10  MG capsule, Take 1 capsule (10 mg total) by mouth 4 (four) times daily -  before meals and at bedtime., Disp: 90 capsule, Rfl: 0   Family History  Problem Relation Age of Onset  . Arthritis Mother   . Asthma Mother   . Hyperlipidemia Mother   . Hypertension Mother   . Stroke Mother   . Ulcers Mother   . Drug abuse Brother   . Early death Brother   . Cancer Maternal Grandmother   . Heart disease Paternal Grandmother   . Diabetes Paternal Aunt      Social History   Tobacco Use  . Smoking status: Current Every Day Smoker     Packs/day: 0.50    Years: 10.00    Pack years: 5.00    Types: Cigarettes    Last attempt to quit: 08/08/2018    Years since quitting: 1.6  . Smokeless tobacco: Never Used  Substance Use Topics  . Alcohol use: Yes    Comment: social  . Drug use: Yes    Types: Marijuana    Comment: Occassionally    Allergies as of 03/27/2020 - Review Complete 03/27/2020  Allergen Reaction Noted  . Latex Other (See Comments) 05/19/2018    Imaging Studies: Reviewed  Assessment and Plan:   Burnetta Kohls is a 29 y.o. female with obesity, history of C. difficile infection, treated with vancomycin and fidaxomicin in the past is seen in consultation for 1 year history of recurrence of nonbloody diarrhea  GI profile PCR to rule out infection Check CRP, celiac disease panel, H. pylori breath test Trial of Bentyl 10 mg before each meal and at bedtime  Follow Up Instructions:   I discussed the assessment and treatment plan with the patient. The patient was provided an opportunity to ask questions and all were answered. The patient agreed with the plan and demonstrated an understanding of the instructions.   The patient was advised to call back or seek an in-person evaluation if the symptoms worsen or if the condition fails to improve as anticipated.  I provided 15 minutes of face-to-face time during this encounter.   Follow up in 2 months   Samantha Repress, MD

## 2020-04-17 ENCOUNTER — Other Ambulatory Visit: Payer: Self-pay | Admitting: Family Medicine

## 2020-04-22 ENCOUNTER — Ambulatory Visit: Payer: Medicaid Other | Admitting: Dermatology

## 2020-05-15 ENCOUNTER — Other Ambulatory Visit: Payer: Self-pay

## 2020-05-15 ENCOUNTER — Ambulatory Visit (INDEPENDENT_AMBULATORY_CARE_PROVIDER_SITE_OTHER): Payer: Medicaid Other | Admitting: Dermatology

## 2020-05-15 DIAGNOSIS — H0263 Xanthelasma of right eye, unspecified eyelid: Secondary | ICD-10-CM | POA: Diagnosis not present

## 2020-05-15 DIAGNOSIS — B079 Viral wart, unspecified: Secondary | ICD-10-CM | POA: Diagnosis not present

## 2020-05-15 DIAGNOSIS — H0266 Xanthelasma of left eye, unspecified eyelid: Secondary | ICD-10-CM

## 2020-05-15 NOTE — Patient Instructions (Addendum)
Dr. Mickie Kay or Dr. Deland Pretty - opthalmologists at [facility]. May call for a consultation to treat Xanthelasma of upper eyelids.

## 2020-05-15 NOTE — Progress Notes (Signed)
   Follow-Up Visit   Subjective  Samantha Villegas is a 29 y.o. female who presents for the following: Warts (of right hand. She has had them for ~2 years but they seem to be getting better now.) and Other (Spots of bilat upper eyelids. She has had her cholesterol checked and it was WNL.).  The following portions of the chart were reviewed this encounter and updated as appropriate:  Tobacco  Allergies  Meds  Problems  Med Hx  Surg Hx  Fam Hx      Review of Systems:  No other skin or systemic complaints except as noted in HPI or Assessment and Plan.  Objective  Well appearing patient in no apparent distress; mood and affect are within normal limits.  A focused examination was performed including face,hands. Relevant physical exam findings are noted in the Assessment and Plan.  Objective  Right 3rd Finger periungual area, Right 5th Finger periungual area: Verrucous papules   Objective  Left Medial Canthus: Yellow plaques.   Assessment & Plan    Viral warts, unspecified type (2) Right 3rd Finger periungual area; Right 5th Finger periungual area  3% squaric acid and Cantherone Plus applied to all areas today.  3% Squaric acid applied to inside of left upper arm for sensitization. Cloderm cream qd-bid prn rash - sample given.  Destruction of lesion - Right 3rd Finger periungual area, Right 5th Finger periungual area Complexity: simple   Destruction method: cryotherapy   Informed consent: discussed and consent obtained   Timeout:  patient name, date of birth, surgical site, and procedure verified Lesion destroyed using liquid nitrogen: Yes   Region frozen until ice ball extended beyond lesion: Yes   Outcome: patient tolerated procedure well with no complications   Post-procedure details: wound care instructions given    Xanthelasma of eyelid, bilateral Left Medial Canthus  Advised patient condition is benign and are cholesterol deposits. Advised not likely  related to her cholesterol since she has had it checked and it is WNL.  Discussed evaluation with Dr. Deland Pretty or Dr. Mickie Kay for possible excision.  Return in about 2 years (around 05/15/2022).   I, Joanie Coddington, CMA, am acting as scribe for Armida Sans, MD .  Documentation: I have reviewed the above documentation for accuracy and completeness, and I agree with the above.  Armida Sans, MD

## 2020-05-21 ENCOUNTER — Encounter: Payer: Self-pay | Admitting: Dermatology

## 2020-05-28 ENCOUNTER — Encounter: Payer: Self-pay | Admitting: Dermatology

## 2020-06-05 ENCOUNTER — Encounter: Payer: Self-pay | Admitting: Family Medicine

## 2020-06-06 ENCOUNTER — Other Ambulatory Visit: Payer: Self-pay | Admitting: Family Medicine

## 2020-06-06 MED ORDER — FLUTICASONE PROPIONATE 50 MCG/ACT NA SUSP: 2.0000 | Freq: Two times a day (BID) | NASAL | 11 refills | Status: DC

## 2020-07-15 ENCOUNTER — Other Ambulatory Visit: Payer: Self-pay | Admitting: Family Medicine

## 2020-07-15 NOTE — Telephone Encounter (Signed)
Requested medication (s) are due for refill today:   Yes  Requested medication (s) are on the active medication list:   Yes  Future visit scheduled:   No   Last ordered: 4//12/2019 18 g, RF 1  Returned because pharmacy is requesting an alternative.  This is non formulary for medicaid.   Requested Prescriptions  Pending Prescriptions Disp Refills   PROAIR HFA 108 (90 Base) MCG/ACT inhaler [Pharmacy Med Name: PROAIR HFA 90 MCG INHALER]  0      Pulmonology:  Beta Agonists Failed - 07/15/2020  1:57 PM      Failed - One inhaler should last at least one month. If the patient is requesting refills earlier, contact the patient to check for uncontrolled symptoms.      Passed - Valid encounter within last 12 months    Recent Outpatient Visits           3 months ago Right flank pain   [facility] Roosvelt Maser [place], [place]   5 months ago BV (bacterial vaginosis)   [facility] Roosvelt Maser [place], [place]   1 year ago Viral URI with cough   [facility] Particia Nearing, [place]   2 years ago Dysuria   [facility] Gabriel Cirri, NP   2 years ago Diarrhea of presumed infectious origin   [facility] Gabriel Cirri, NP       Future Appointments             In 2 weeks Deirdre Evener, MD [facility]

## 2020-07-30 ENCOUNTER — Ambulatory Visit: Payer: Medicaid Other | Admitting: Dermatology

## 2020-08-02 ENCOUNTER — Encounter: Payer: Self-pay | Admitting: Nurse Practitioner

## 2020-08-02 DIAGNOSIS — J45909 Unspecified asthma, uncomplicated: Secondary | ICD-10-CM | POA: Insufficient documentation

## 2020-08-09 ENCOUNTER — Ambulatory Visit (INDEPENDENT_AMBULATORY_CARE_PROVIDER_SITE_OTHER): Payer: Medicaid Other | Admitting: Nurse Practitioner

## 2020-08-09 ENCOUNTER — Encounter: Payer: Self-pay | Admitting: Nurse Practitioner

## 2020-08-09 ENCOUNTER — Other Ambulatory Visit: Payer: Self-pay

## 2020-08-09 VITALS — BP 110/76 | HR 80 | Temp 98.8°F | Wt 259.0 lb

## 2020-08-09 DIAGNOSIS — Z6841 Body Mass Index (BMI) 40.0 and over, adult: Secondary | ICD-10-CM

## 2020-08-09 DIAGNOSIS — M545 Low back pain, unspecified: Secondary | ICD-10-CM

## 2020-08-09 DIAGNOSIS — N760 Acute vaginitis: Secondary | ICD-10-CM | POA: Diagnosis not present

## 2020-08-09 DIAGNOSIS — B9689 Other specified bacterial agents as the cause of diseases classified elsewhere: Secondary | ICD-10-CM | POA: Insufficient documentation

## 2020-08-09 DIAGNOSIS — F17210 Nicotine dependence, cigarettes, uncomplicated: Secondary | ICD-10-CM | POA: Diagnosis not present

## 2020-08-09 LAB — UA/M W/RFLX CULTURE, ROUTINE
Bilirubin, UA: NEGATIVE
Glucose, UA: NEGATIVE
Ketones, UA: NEGATIVE
Leukocytes,UA: NEGATIVE
Nitrite, UA: NEGATIVE
Protein,UA: NEGATIVE
Specific Gravity, UA: 1.01 (ref 1.005–1.030)
Urobilinogen, Ur: 0.2 mg/dL (ref 0.2–1.0)
pH, UA: 6 (ref 5.0–7.5)

## 2020-08-09 LAB — WET PREP FOR TRICH, YEAST, CLUE
Clue Cell Exam: POSITIVE — AB
Trichomonas Exam: NEGATIVE
Yeast Exam: NEGATIVE

## 2020-08-09 LAB — MICROSCOPIC EXAMINATION
Bacteria, UA: NONE SEEN
WBC, UA: NONE SEEN /hpf (ref 0–5)

## 2020-08-09 MED ORDER — METRONIDAZOLE 0.75 % VA GEL: 1.0000 | Freq: Every day | VAGINAL | 0 refills | Status: DC

## 2020-08-09 NOTE — Assessment & Plan Note (Signed)
I have recommended complete cessation of tobacco use. I have discussed various options available for assistance with tobacco cessation including over the counter methods (Nicotine gum, patch and lozenges). We also discussed prescription options (Chantix, Nicotine Inhaler / Nasal Spray). The patient is not interested in pursuing any prescription tobacco cessation options at this time -- she wishes to read more on Wellbutrin and will notify provider via MyChart if wishes to start this.

## 2020-08-09 NOTE — Assessment & Plan Note (Signed)
Recurrent issue.  UA 2+ blood (at end of menstrual cycle) and not other findings.  Wet prep positive for clue cells. Will send in Metrogel vs oral treatment, as last treatment caused C Diff infection .  Educated her on this.  Check labs today to include BMP, A1C, and TSH.  Educated her on hygiene techniques, including partner hygiene.  If ongoing recurrence will consider GYN referral for recommendations.

## 2020-08-09 NOTE — Progress Notes (Signed)
BP 110/76   Pulse 80   Temp 98.8 F (37.1 C) (Oral)   Wt 259 lb (117.5 kg)   SpO2 97%   BMI 41.80 kg/m    Subjective:    Patient ID: Samantha Villegas, female    DOB: 10/18/1991, 29 y.o.   MRN: [id number]  HPI: Samantha Villegas is a 29 y.o. female  Chief Complaint  Patient presents with  . Back Pain    mainly lower flank right side but simetimes left side also. for a few months now  . Nicotine Dependence    pt would like something to help her to stop smoking   BACK PAIN Seen by previous PCP last for similar issue on 03/20/20.  She has abdominal ultrasound with no abnormal findings.  Followed by GI on 03/27/20 for non bloody diarrhea and history of C Diff and a Bentyl trial was done.  She reports that the pain back in February went away after treatment for BV.  Denies any UTI symptoms or odor.  No increased vaginal discharge.  At this time has had back pain for two weeks.  Has had recurrent BV infections -- this is 3rd infection.   Duration: weeks Mechanism of injury: unknown Location: midline and bilateral Onset: gradual Severity: 8/10 Quality: dull and aching Frequency: intermittent Radiation: none Aggravating factors: standing too long Alleviating factors: resting Status: stable Treatments attempted: APAP Relief with NSAIDs?: No NSAIDs Taken Nighttime pain:  no Paresthesias / decreased sensation:  no Bowel / bladder incontinence:  no Fevers:  no Dysuria / urinary frequency:  no   NICOTINE DEPENDENCE: She is interested in quitting, smoking since age 58.  Currently smokes 1/2 to 1 PPD.  Has tried Chantix in past which caused depressive thoughts.  Tried patches in past without benefit.    Relevant past medical, surgical, family and social history reviewed and updated as indicated. Interim medical history since our last visit reviewed. Allergies and medications reviewed and updated.  Review of Systems  Constitutional: Negative for activity change, appetite  change, diaphoresis, fatigue and fever.  Respiratory: Negative for cough, chest tightness and shortness of breath.   Cardiovascular: Negative for chest pain, palpitations and leg swelling.  Gastrointestinal: Negative.   Genitourinary: Negative.   Neurological: Negative.   Psychiatric/Behavioral: Negative.     Per HPI unless specifically indicated above     Objective:    BP 110/76   Pulse 80   Temp 98.8 F (37.1 C) (Oral)   Wt 259 lb (117.5 kg)   SpO2 97%   BMI 41.80 kg/m   Wt Readings from Last 3 Encounters:  08/09/20 259 lb (117.5 kg)  03/20/20 266 lb (120.7 kg)  02/07/20 269 lb (122 kg)    Physical Exam Vitals and nursing note reviewed.  Constitutional:      General: She is awake. She is not in acute distress.    Appearance: She is well-developed and well-groomed. She is obese. She is not ill-appearing.  HENT:     Head: Normocephalic.     Right Ear: Hearing normal.     Left Ear: Hearing normal.  Eyes:     General: Lids are normal.        Right eye: No discharge.        Left eye: No discharge.     Conjunctiva/sclera: Conjunctivae normal.     Pupils: Pupils are equal, round, and reactive to light.  Cardiovascular:     Rate and Rhythm: Normal rate and regular rhythm.  Heart sounds: Normal heart sounds. No murmur heard.  No gallop.   Pulmonary:     Effort: Pulmonary effort is normal. No accessory muscle usage or respiratory distress.     Breath sounds: Normal breath sounds.  Abdominal:     General: Bowel sounds are normal.     Palpations: Abdomen is soft. There is no hepatomegaly or splenomegaly.     Tenderness: There is no abdominal tenderness. There is no right CVA tenderness or left CVA tenderness.  Musculoskeletal:     Cervical back: Normal range of motion and neck supple.     Right lower leg: No edema.     Left lower leg: No edema.  Skin:    General: Skin is warm and dry.  Neurological:     Mental Status: She is alert and oriented to person, place,  and time.  Psychiatric:        Attention and Perception: Attention normal.        Mood and Affect: Mood normal.        Speech: Speech normal.        Behavior: Behavior normal. Behavior is cooperative.        Thought Content: Thought content normal.     Results for orders placed or performed in visit on 03/20/20  CBC with Differential/Platelet  Result Value Ref Range   WBC 9.8 3.4 - 10.8 x10E3/uL   RBC 4.98 3.77 - 5.28 x10E6/uL   Hemoglobin 14.4 11.1 - 15.9 g/dL   Hematocrit 83.4 19.6 - 46.6 %   MCV 87 79 - 97 fL   MCH 28.9 26.6 - 33.0 pg   MCHC 33.4 31 - 35 g/dL   RDW 22.2 97.9 - 89.2 %   Platelets 317 150 - 450 x10E3/uL   Neutrophils 59 Not Estab. %   Lymphs 30 Not Estab. %   Monocytes 6 Not Estab. %   Eos 4 Not Estab. %   Basos 1 Not Estab. %   Neutrophils Absolute 5.9 1 - 7 x10E3/uL   Lymphocytes Absolute 2.9 0 - 3 x10E3/uL   Monocytes Absolute 0.6 0 - 0 x10E3/uL   EOS (ABSOLUTE) 0.4 0.0 - 0.4 x10E3/uL   Basophils Absolute 0.1 0 - 0 x10E3/uL   Immature Granulocytes 0 Not Estab. %   Immature Grans (Abs) 0.0 0.0 - 0.1 x10E3/uL  Comprehensive metabolic panel  Result Value Ref Range   Glucose 91 65 - 99 mg/dL   BUN 6 6 - 20 mg/dL   Creatinine, Ser 1.19 0.57 - 1.00 mg/dL   GFR calc non Af Amer 119 >59 mL/min/1.73   GFR calc Af Amer 137 >59 mL/min/1.73   BUN/Creatinine Ratio 9 9 - 23   Sodium 141 134 - 144 mmol/L   Potassium 4.5 3.5 - 5.2 mmol/L   Chloride 106 96 - 106 mmol/L   CO2 22 20 - 29 mmol/L   Calcium 9.3 8.7 - 10.2 mg/dL   Total Protein 7.0 6.0 - 8.5 g/dL   Albumin 4.2 3.9 - 5.0 g/dL   Globulin, Total 2.8 1.5 - 4.5 g/dL   Albumin/Globulin Ratio 1.5 1.2 - 2.2   Bilirubin Total 0.2 0.0 - 1.2 mg/dL   Alkaline Phosphatase 96 39 - 117 IU/L   AST 14 0 - 40 IU/L   ALT 20 0 - 32 IU/L  Lipase  Result Value Ref Range   Lipase 28 14 - 72 U/L  UA/M w/rflx Culture, Routine   Specimen: Urine   URINE  Result Value  Ref Range   Specific Gravity, UA 1.015 1.005 -  1.030   pH, UA 5.5 5.0 - 7.5   Color, UA Yellow Yellow   Appearance Ur Clear Clear   Leukocytes,UA Negative Negative   Protein,UA Negative Negative/Trace   Glucose, UA Negative Negative   Ketones, UA Negative Negative   RBC, UA Negative Negative   Bilirubin, UA Negative Negative   Urobilinogen, Ur 0.2 0.2 - 1.0 mg/dL   Nitrite, UA Negative Negative      Assessment & Plan:   Problem List Items Addressed This Visit      Genitourinary   Bacterial vaginosis - Primary    Recurrent issue.  UA 2+ blood (at end of menstrual cycle) and not other findings.  Wet prep positive for clue cells. Will send in Metrogel vs oral treatment, as last treatment caused C Diff infection .  Educated her on this.  Check labs today to include BMP, A1C, and TSH.  Educated her on hygiene techniques, including partner hygiene.  If ongoing recurrence will consider GYN referral for recommendations.      Relevant Orders   WET PREP FOR TRICH, YEAST, CLUE     Other   Obesity    Recommended eating smaller high protein, low fat meals more frequently and exercising 30 mins a day 5 times a week with a goal of 10-15lb weight loss in the next 3 months. Patient voiced their understanding and motivation to adhere to these recommendations.       Nicotine dependence, cigarettes, uncomplicated    I have recommended complete cessation of tobacco use. I have discussed various options available for assistance with tobacco cessation including over the counter methods (Nicotine gum, patch and lozenges). We also discussed prescription options (Chantix, Nicotine Inhaler / Nasal Spray). The patient is not interested in pursuing any prescription tobacco cessation options at this time -- she wishes to read more on Wellbutrin and will notify provider via MyChart if wishes to start this.        Other Visit Diagnoses    Low back pain, unspecified back pain laterality, unspecified chronicity, unspecified whether sciatica present        UA and Wet prep obtained today noting BV infection, recurrent.   Relevant Orders   UA/M w/rflx Culture, Routine   HgB A1c   Basic metabolic panel   TSH       Follow up plan: Return if symptoms worsen or fail to improve.

## 2020-08-09 NOTE — Assessment & Plan Note (Signed)
Recommended eating smaller high protein, low fat meals more frequently and exercising 30 mins a day 5 times a week with a goal of 10-15lb weight loss in the next 3 months. Patient voiced their understanding and motivation to adhere to these recommendations.  

## 2020-08-09 NOTE — Patient Instructions (Signed)
Bupropion tablets (Depression/Mood Disorders) What is this medicine? BUPROPION (byoo PROE pee on) is used to treat depression. This medicine may be used for other purposes; ask your health care provider or pharmacist if you have questions. COMMON BRAND NAME(S): Wellbutrin What should I tell my health care provider before I take this medicine? They need to know if you have any of these conditions:  an eating disorder, such as anorexia or bulimia  bipolar disorder or psychosis  diabetes or high blood sugar, treated with medication  glaucoma  heart disease, previous heart attack, or irregular heart beat  head injury or brain tumor  high blood pressure  kidney or liver disease  seizures  suicidal thoughts or a previous suicide attempt  Tourette's syndrome  weight loss  an unusual or allergic reaction to bupropion, other medicines, foods, dyes, or preservatives  breast-feeding  pregnant or trying to become pregnant How should I use this medicine? Take this medicine by mouth with a glass of water. Follow the directions on the prescription label. You can take it with or without food. If it upsets your stomach, take it with food. Take your medicine at regular intervals. Do not take your medicine more often than directed. Do not stop taking this medicine suddenly except upon the advice of your doctor. Stopping this medicine too quickly may cause serious side effects or your condition may worsen. A special MedGuide will be given to you by the pharmacist with each prescription and refill. Be sure to read this information carefully each time. Talk to your pediatrician regarding the use of this medicine in children. Special care may be needed. Overdosage: If you think you have taken too much of this medicine contact a poison control center or emergency room at once. NOTE: This medicine is only for you. Do not share this medicine with others. What if I miss a dose? If you miss a dose,  take it as soon as you can. If it is less than four hours to your next dose, take only that dose and skip the missed dose. Do not take double or extra doses. What may interact with this medicine? Do not take this medicine with any of the following medications:  linezolid  MAOIs like Azilect, Carbex, Eldepryl, Marplan, Nardil, and Parnate  methylene blue (injected into a vein)  other medicines that contain bupropion like Zyban This medicine may also interact with the following medications:  alcohol  certain medicines for anxiety or sleep  certain medicines for blood pressure like metoprolol, propranolol  certain medicines for depression or psychotic disturbances  certain medicines for HIV or AIDS like efavirenz, lopinavir, nelfinavir, ritonavir  certain medicines for irregular heart beat like propafenone, flecainide  certain medicines for Parkinson's disease like amantadine, levodopa  certain medicines for seizures like carbamazepine, phenytoin, phenobarbital  cimetidine  clopidogrel  cyclophosphamide  digoxin  furazolidone  isoniazid  nicotine  orphenadrine  procarbazine  steroid medicines like prednisone or cortisone  stimulant medicines for attention disorders, weight loss, or to stay awake  tamoxifen  theophylline  thiotepa  ticlopidine  tramadol  warfarin This list may not describe all possible interactions. Give your health care provider a list of all the medicines, herbs, non-prescription drugs, or dietary supplements you use. Also tell them if you smoke, drink alcohol, or use illegal drugs. Some items may interact with your medicine. What should I watch for while using this medicine? Tell your doctor if your symptoms do not get better or if they get worse.   Visit your doctor or healthcare provider for regular checks on your progress. Because it may take several weeks to see the full effects of this medicine, it is important to continue your  treatment as prescribed by your doctor. This medicine may cause serious skin reactions. They can happen weeks to months after starting the medicine. Contact your healthcare provider right away if you notice fevers or flu-like symptoms with a rash. The rash may be red or purple and then turn into blisters or peeling of the skin. Or, you might notice a red rash with swelling of the face, lips or lymph nodes in your neck or under your arms. Patients and their families should watch out for new or worsening thoughts of suicide or depression. Also watch out for sudden changes in feelings such as feeling anxious, agitated, panicky, irritable, hostile, aggressive, impulsive, severely restless, overly excited and hyperactive, or not being able to sleep. If this happens, especially at the beginning of treatment or after a change in dose, call your healthcare provider. Avoid alcoholic drinks while taking this medicine. Drinking excessive alcoholic beverages, using sleeping or anxiety medicines, or quickly stopping the use of these agents while taking this medicine may increase your risk for a seizure. Do not drive or use heavy machinery until you know how this medicine affects you. This medicine can impair your ability to perform these tasks. Do not take this medicine close to bedtime. It may prevent you from sleeping. Your mouth may get dry. Chewing sugarless gum or sucking hard candy, and drinking plenty of water may help. Contact your doctor if the problem does not go away or is severe. What side effects may I notice from receiving this medicine? Side effects that you should report to your doctor or health care professional as soon as possible:  allergic reactions like skin rash, itching or hives, swelling of the face, lips, or tongue  breathing problems  changes in vision  confusion  elevated mood, decreased need for sleep, racing thoughts, impulsive behavior  fast or irregular  heartbeat  hallucinations, loss of contact with reality  increased blood pressure  rash, fever, and swollen lymph nodes  redness, blistering, peeling, or loosening of the skin, including inside the mouth  seizures  suicidal thoughts or other mood changes  unusually weak or tired  vomiting Side effects that usually do not require medical attention (report to your doctor or health care professional if they continue or are bothersome):  constipation  headache  loss of appetite  nausea  tremors  weight loss This list may not describe all possible side effects. Call your doctor for medical advice about side effects. You may report side effects to FDA at 1-800-FDA-1088. Where should I keep my medicine? Keep out of the reach of children. Store at room temperature between 20 and 25 degrees C (68 and 77 degrees F), away from direct sunlight and moisture. Keep tightly closed. Throw away any unused medicine after the expiration date. NOTE: This sheet is a summary. It may not cover all possible information. If you have questions about this medicine, talk to your doctor, pharmacist, or health care provider.  2020 Elsevier/Gold Standard (2019-02-16 14:02:47)  

## 2020-08-10 LAB — BASIC METABOLIC PANEL
BUN/Creatinine Ratio: 16 (ref 9–23)
BUN: 8 mg/dL (ref 6–20)
CO2: 23 mmol/L (ref 20–29)
Calcium: 9.2 mg/dL (ref 8.7–10.2)
Chloride: 104 mmol/L (ref 96–106)
Creatinine, Ser: 0.51 mg/dL — ABNORMAL LOW (ref 0.57–1.00)
GFR calc Af Amer: 149 mL/min/{1.73_m2} (ref >59)
GFR calc non Af Amer: 129 mL/min/{1.73_m2} (ref >59)
Glucose: 83 mg/dL (ref 65–99)
Potassium: 4.2 mmol/L (ref 3.5–5.2)
Sodium: 138 mmol/L (ref 134–144)

## 2020-08-10 LAB — HEMOGLOBIN A1C
Est. average glucose Bld gHb Est-mCnc: 120 mg/dL
Hgb A1c MFr Bld: 5.8 % — ABNORMAL HIGH (ref 4.8–5.6)

## 2020-08-10 LAB — TSH: TSH: 1.12 u[IU]/mL (ref 0.450–4.500)

## 2020-08-12 NOTE — Progress Notes (Signed)
Contacted via MyChart  Good afternoon Samantha Villegas, your labs have returned.   - A1C, diabetes testing, is 5.8% which is considered prediabetes.  Prediabetes is any number 5.7 to 6.4% and diabetes is 6.5% or greater.  For this I recommend heavy focus on diet, decreasing foods high in sugar and carbohydrates.  We will recheck this in 3-6 months. - Kidney function, electrolytes, and thyroid are normal Keep being awesome!!  Thank you for allowing me to participate in your care. Kindest regards, Weldon Nouri

## 2020-08-13 ENCOUNTER — Other Ambulatory Visit: Payer: Self-pay | Admitting: Nurse Practitioner

## 2020-08-13 MED ORDER — BUPROPION HCL ER (SR) 150 MG PO TB12
ORAL_TABLET | ORAL | 4 refills | Status: DC
Start: 2020-08-13 — End: 2020-09-06

## 2020-08-20 ENCOUNTER — Other Ambulatory Visit: Payer: Self-pay | Admitting: Nurse Practitioner

## 2020-08-20 MED ORDER — METRONIDAZOLE 0.75 % VA GEL: 1.0000 | Freq: Every day | VAGINAL | 0 refills | Status: AC

## 2020-09-05 ENCOUNTER — Other Ambulatory Visit: Payer: Self-pay | Admitting: Nurse Practitioner

## 2020-09-05 NOTE — Telephone Encounter (Signed)
Requested medication (s) are due for refill today: No  Requested medication (s) are on the active medication list: Yes  Last refill:  08/13/20 # 60 with 4 refills  Future visit scheduled: No  Notes to clinic:  Pharmacy requesting 90 day supply.    Requested Prescriptions  Pending Prescriptions Disp Refills   buPROPion (WELLBUTRIN SR) 150 MG 12 hr tablet [Pharmacy Med Name: BUPROPION HCL SR 150 MG TABLET] 180 tablet 2    Sig: Start out taking 150 mg (1 tablet) once daily by mouth for 3 days; then increase to 150 mg (1 tablet) by mouth twice daily.      Psychiatry: Antidepressants - bupropion Passed - 09/05/2020  8:32 AM      Passed - Last BP in normal range    BP Readings from Last 1 Encounters:  08/09/20 110/76          Passed - Valid encounter within last 6 months    Recent Outpatient Visits           3 weeks ago Bacterial vaginosis   [facility] Blanchard, Corrie Dandy T, NP   5 months ago Right flank pain   [facility] Roosvelt Maser [place], [place]   7 months ago BV (bacterial vaginosis)   [facility] Roosvelt Maser [place], [place]   2 years ago Viral URI with cough   [facility] Particia Nearing, [place]   2 years ago Dysuria   [facility] Gabriel Cirri, NP

## 2020-09-05 NOTE — Telephone Encounter (Signed)
Routing to provider  

## 2020-10-18 ENCOUNTER — Other Ambulatory Visit: Payer: Self-pay

## 2020-10-18 ENCOUNTER — Emergency Department
Admission: EM | Admit: 2020-10-18 | Discharge: 2020-10-18 | Disposition: A | Discharge status: Home / Self Care | Payer: Medicaid Other | Attending: Emergency Medicine | Admitting: Emergency Medicine

## 2020-10-18 ENCOUNTER — Encounter: Payer: Self-pay | Admitting: Emergency Medicine

## 2020-10-18 DIAGNOSIS — Z91040 Latex allergy status: Secondary | ICD-10-CM | POA: Diagnosis not present

## 2020-10-18 DIAGNOSIS — J988 Other specified respiratory disorders: Secondary | ICD-10-CM

## 2020-10-18 DIAGNOSIS — J069 Acute upper respiratory infection, unspecified: Secondary | ICD-10-CM | POA: Insufficient documentation

## 2020-10-18 DIAGNOSIS — J45909 Unspecified asthma, uncomplicated: Secondary | ICD-10-CM | POA: Diagnosis not present

## 2020-10-18 DIAGNOSIS — F17210 Nicotine dependence, cigarettes, uncomplicated: Secondary | ICD-10-CM | POA: Insufficient documentation

## 2020-10-18 DIAGNOSIS — R059 Cough, unspecified: Secondary | ICD-10-CM | POA: Diagnosis present

## 2020-10-18 DIAGNOSIS — R0789 Other chest pain: Secondary | ICD-10-CM | POA: Insufficient documentation

## 2020-10-18 DIAGNOSIS — Z20822 Contact with and (suspected) exposure to covid-19: Secondary | ICD-10-CM | POA: Insufficient documentation

## 2020-10-18 LAB — RESPIRATORY PANEL BY RT PCR (FLU A&B, COVID)
Influenza A by PCR: NEGATIVE
Influenza B by PCR: NEGATIVE
SARS Coronavirus 2 by RT PCR: NEGATIVE

## 2020-10-18 MED ORDER — PREDNISONE 50 MG PO TABS: 50.0000 mg | ORAL_TABLET | Freq: Every day | ORAL | 0 refills | Status: DC

## 2020-10-18 MED ORDER — PREDNISONE 20 MG PO TABS
60.0000 mg | ORAL_TABLET | Freq: Once | ORAL | Status: AC
  Administered 2020-10-18: 60 mg via ORAL
  Filled 2020-10-18: qty 3

## 2020-10-18 NOTE — ED Triage Notes (Signed)
Pt to ED via POV c/o body aches, chills, and HA. Pt denies COVID contacts and states that she has not traveled anywhere. Pt denies fever at this time. Pt is in NAD.

## 2020-10-18 NOTE — ED Provider Notes (Signed)
[facility] Emergency Department Provider Note  ____________________________________________  Time seen: Approximately 8:15 PM  I have reviewed the triage vital signs and the nursing notes.   HISTORY  Chief Complaint COVID symptoms    HPI Samantha Villegas is a 29 y.o. female who presents the emergency department complaining of chills, body aches, nasal congestion, cough, mild shortness of breath and chest tightness.  Patient presented with viral URI symptoms x3 days.  She states that she does have a history of asthma, typically has either an asthma exacerbation or bronchitis when she gets a viral infection.  She denied any fevers, no neck pain or stiffness.  Patient denies any chest pain or abdominal pain.  Patient states that she started feeling "tight" so she picked up a refill of her albuterol and has used 1 dose of that.  Cough is nonproductive in nature.  No urinary or GI complaints.  Patient has tried Tylenol at home for additional symptom control.  Medical history as described below, other than asthma no complaints of chronic medical issues.   Patient denies any recent sick contacts.  She states that her daughter is in prestart and is constantly coming home with "little viruses."        Past Medical History:  Diagnosis Date  . Anxiety    no meds  . Asthma    h/o no inhalers  . GERD (gastroesophageal reflux disease)    no meds  . History of itching   . Pyelonephritis   . Skipped heart beats    pt states this happens occ but is asymptomatic    Patient Active Problem List   Diagnosis Date Noted  . Bacterial vaginosis 08/09/2020  . Asthma 08/02/2020  . Nicotine dependence, cigarettes, uncomplicated 04/03/2019  . Obesity 03/13/2019  . Anxiety 01/18/2019  . Intermittent palpitations 11/03/2018  . Clostridium difficile infection 08/29/2018  . Right ovarian cyst 05/11/2018  . Allergic rhinitis 03/20/2018    Past Surgical History:   Procedure Laterality Date  . CESAREAN SECTION    . LAPAROSCOPIC OVARIAN CYSTECTOMY Right 05/26/2018   Procedure: LAPAROSCOPIC RIGHT OVARIAN CYSTECTOMY;  Surgeon: Natale Milch, MD;  Location: [facility] ORS;  Service: Gynecology;  Laterality: Right;    Prior to Admission medications   Medication Sig Start Date End Date Taking? Authorizing Provider  acetaminophen (TYLENOL) 500 MG tablet Take 1,000 mg by mouth every 6 (six) hours as needed (for pain.).    [provider]  albuterol (PROAIR HFA) 108 (90 Base) MCG/ACT inhaler Inhale 2 puffs into the lungs every 6 (six) hours as needed for wheezing or shortness of breath. 07/15/20   Cannady, Corrie Dandy T, NP  buPROPion (WELLBUTRIN SR) 150 MG 12 hr tablet START OUT TAKING 150 MG (1 TABLET) ONCE DAILY BY MOUTH FOR 3 DAYS THEN INCREASE TO 150 MG (1 TABLET) BY MOUTH TWICE DAILY. 09/06/20   Johnson, Megan P, DO  fluticasone (FLONASE) 50 MCG/ACT nasal spray Place 2 sprays into both nostrils 2 (two) times daily. 06/06/20   Particia Nearing, PA-C  ibuprofen (ADVIL) 600 MG tablet TAKE 1 TABLET (600 MG TOTAL) BY MOUTH EVERY SIX (6) HOURS. 04/05/19   [provider]  pantoprazole (PROTONIX) 40 MG tablet Take 1 tablet (40 mg total) by mouth daily. 03/20/20   Particia Nearing, PA-C  predniSONE (DELTASONE) 50 MG tablet Take 1 tablet (50 mg total) by mouth daily with breakfast. 10/18/20   Ingri Diemer, Delorise Royals, PA-C    Allergies Latex  Family History  Problem Relation Age of Onset  . Arthritis Mother   . Asthma Mother   . Hyperlipidemia Mother   . Hypertension Mother   . Stroke Mother   . Ulcers Mother   . Drug abuse Brother   . Early death Brother   . Cancer Maternal Grandmother   . Heart disease Paternal Grandmother   . Diabetes Paternal Aunt     Social History Social History   Tobacco Use  . Smoking status: Current Every Day Smoker    Packs/day: 0.50    Years: 10.00    Pack years: 5.00    Types: Cigarettes    Last  attempt to quit: 08/08/2018    Years since quitting: 2.1  . Smokeless tobacco: Never Used  Vaping Use  . Vaping Use: Former  Substance Use Topics  . Alcohol use: Yes    Comment: social  . Drug use: Yes    Types: Marijuana    Comment: Occassionally     Review of Systems  Constitutional: No fever/chills Eyes: No visual changes. No discharge ENT: Positive for nasal congestion Cardiovascular: no chest pain. Respiratory: Positive cough.  Mild SOB.  Chest tightness Gastrointestinal: No abdominal pain.  No nausea, no vomiting.  No diarrhea.  No constipation. Genitourinary: Negative for dysuria. No hematuria Musculoskeletal: Negative for musculoskeletal pain. Skin: Negative for rash, abrasions, lacerations, ecchymosis. Neurological: Negative for headaches, focal weakness or numbness.  10 System ROS otherwise negative.  ____________________________________________   PHYSICAL EXAM:  VITAL SIGNS: ED Triage Vitals  Enc Vitals Group     BP 10/18/20 1855 123/75     Pulse Rate 10/18/20 1853 98     Resp 10/18/20 1853 16     Temp 10/18/20 1853 98.4 F (36.9 C)     Temp Source 10/18/20 1853 Oral     SpO2 10/18/20 1853 97 %     Weight 10/18/20 1852 256 lb (116.1 kg)     Height 10/18/20 1852 5\' 6"  (1.676 m)     Head Circumference --      Peak Flow --      Pain Score 10/18/20 1852 8     Pain Loc --      Pain Edu? --      Excl. in GC? --      Constitutional: Alert and oriented. Well appearing and in no acute distress. Eyes: Conjunctivae are normal. PERRL. EOMI. Head: Atraumatic. ENT:      Ears: EACs unremarkable bilaterally.  TMs are minimally bulging bilaterally.      Nose: Mild clear congestion/rhinnorhea.      Mouth/Throat: Mucous membranes are moist.  Oropharynx is minimally erythematous but not edematous.  No exudates.  Uvula is midline. Neck: No stridor.  Neck is supple [place] motion Hematological/Lymphatic/Immunilogical: No cervical lymphadenopathy. Cardiovascular:  Normal rate, regular rhythm. Normal S1 and S2.  Good peripheral circulation. Respiratory: Normal respiratory effort without tachypnea or retractions. Lungs with faint expiratory wheezing on the left.  No inspiratory or expiratory wheezing on the right.  No rales or rhonchi.  No decreased breath sounds.13/12/21 air entry to the bases with no decreased or absent breath sounds. Musculoskeletal: Full range of motion to all extremities. No gross deformities appreciated. Neurologic:  Normal speech and language. No gross focal neurologic deficits are appreciated.  Skin:  Skin is warm, dry and intact. No rash noted. Psychiatric: Mood and affect are normal. Speech and behavior are normal. Patient exhibits appropriate insight and judgement.   ____________________________________________   LABS (all labs ordered  are listed, but only abnormal results are displayed)  Labs Reviewed  RESPIRATORY PANEL BY RT PCR (FLU A&B, COVID)   ____________________________________________  EKG   ____________________________________________  RADIOLOGY   No results found.  ____________________________________________    PROCEDURES  Procedure(s) performed:    Procedures    Medications  predniSONE (DELTASONE) tablet 60 mg (has no administration in time range)     ____________________________________________   INITIAL IMPRESSION / ASSESSMENT AND PLAN / ED COURSE  Pertinent labs & imaging results that were available during my care of the patient were reviewed by me and considered in my medical decision making (see chart for details).  Review of the [place] CSRS was performed in accordance of the [facility] prior to dispensing any controlled drugs.           Patient's diagnosis is consistent with viral respiratory infection.  Patient presented to emergency department complaining of multiple viral symptoms for 3 days.  Patient states that her daughter is constantly coming home with viruses from  prekindergarten.  No recent sick contacts with anybody else.  Her daughter is asymptomatic currently.  Patient denies any Covid contacts.  On exam, findings consistent with viral URI.  She does have a history of asthma and typically has bronchitis or asthma exacerbation with her oral illnesses.  Patient has mild wheezing and will be instructed to use albuterol on a regular basis at home as well as being started on prednisone.  Patient will have Covid test at this time.  She is discharged prior to the return of the results.  No additional labs or imaging deemed necessary at this time.  I have given follow-up instructions for any worsening of symptoms specifically in regards to her asthma.  Patient is aware to quarantine if she is positive for Covid.  In addition to prednisone, use Tylenol, Motrin, cough and cold medications at home.  Drink plenty of fluids.  Follow-up primary care as needed..  Patient is given ED precautions to return to the ED for any worsening or new symptoms.     ____________________________________________  FINAL CLINICAL IMPRESSION(S) / ED DIAGNOSES  Final diagnoses:  Viral respiratory illness      NEW MEDICATIONS STARTED DURING THIS VISIT:  ED Discharge Orders         Ordered    predniSONE (DELTASONE) 50 MG tablet  Daily with breakfast        10/18/20 2031              This chart was dictated using voice recognition software/Dragon. Despite best efforts to proofread, errors can occur which can change the meaning. Any change was purely unintentional.    Racheal Patches, PA-C 10/18/20 2031    Minna Antis, MD 10/18/20 2216

## 2021-03-20 ENCOUNTER — Ambulatory Visit (INDEPENDENT_AMBULATORY_CARE_PROVIDER_SITE_OTHER): Payer: Medicaid Other | Admitting: Nurse Practitioner

## 2021-03-20 ENCOUNTER — Encounter: Payer: Self-pay | Admitting: Nurse Practitioner

## 2021-03-20 ENCOUNTER — Other Ambulatory Visit: Payer: Self-pay

## 2021-03-20 VITALS — BP 131/89 | HR 80 | Temp 97.7°F | Wt 263.2 lb

## 2021-03-20 DIAGNOSIS — N898 Other specified noninflammatory disorders of vagina: Secondary | ICD-10-CM | POA: Diagnosis not present

## 2021-03-20 DIAGNOSIS — R21 Rash and other nonspecific skin eruption: Secondary | ICD-10-CM

## 2021-03-20 DIAGNOSIS — Z0182 Encounter for allergy testing: Secondary | ICD-10-CM

## 2021-03-20 DIAGNOSIS — N760 Acute vaginitis: Secondary | ICD-10-CM | POA: Diagnosis not present

## 2021-03-20 DIAGNOSIS — B9689 Other specified bacterial agents as the cause of diseases classified elsewhere: Secondary | ICD-10-CM

## 2021-03-20 LAB — WET PREP FOR TRICH, YEAST, CLUE
Clue Cell Exam: POSITIVE — AB
Trichomonas Exam: NEGATIVE
Yeast Exam: NEGATIVE

## 2021-03-20 LAB — URINALYSIS, ROUTINE W REFLEX MICROSCOPIC
Bilirubin, UA: NEGATIVE
Glucose, UA: NEGATIVE
Ketones, UA: NEGATIVE
Leukocytes,UA: NEGATIVE
Nitrite, UA: NEGATIVE
Protein,UA: NEGATIVE
RBC, UA: NEGATIVE
Specific Gravity, UA: 1.025 (ref 1.005–1.030)
Urobilinogen, Ur: 0.2 mg/dL (ref 0.2–1.0)
pH, UA: 5.5 (ref 5.0–7.5)

## 2021-03-20 MED ORDER — METRONIDAZOLE 0.75 % VA GEL: 1.0000 | Freq: Every day | VAGINAL | 0 refills | Status: DC

## 2021-03-20 MED ORDER — MONTELUKAST SODIUM 10 MG PO TABS: 10.0000 mg | ORAL_TABLET | Freq: Every day | ORAL | 3 refills | Status: DC

## 2021-03-20 MED ORDER — TRIAMCINOLONE ACETONIDE 0.025 % EX CREA: 1.0000 "application " | TOPICAL_CREAM | Freq: Two times a day (BID) | CUTANEOUS | 0 refills | Status: DC

## 2021-03-20 NOTE — Assessment & Plan Note (Signed)
Complete course of Flagyl.  Return in 3 weeks for repeat test and PAP.

## 2021-03-20 NOTE — Progress Notes (Signed)
BP 131/89   Pulse 80   Temp 97.7 F (36.5 C) (Oral)   Wt 263 lb 3.2 oz (119.4 kg)   SpO2 99%   BMI 42.48 kg/m    Subjective:    Patient ID: Samantha Villegas, female    DOB: May 27, 1991, 30 y.o.   MRN: [id number]  HPI: Samantha Villegas is a 30 y.o. female  Chief Complaint  Patient presents with  . Rash    Pt states she has a rash on her arms and hands that she first noticed about 2 weeks ago. States it does itch occasionally   . Urinary Tract Infection    Pt states she has recurrent UTI's and BV, currently having pain, off and on discharge, and discomfort   . Allergy Testing    Pt requesting to have allergy testing done   BACTERIAL VAGINOSIS Patient has had recurrent BV infections.  Currently has vaginal itching.  Feels like it doesn't totally resolve.  Has had C diff in the past.  Needs Flagyl gel.   RASH Duration:  weeks  Location: arms and legs  Itching: yes Burning: no Redness: no Oozing: no Scaling: no Blisters: no Painful: no Fevers: no Change in detergents/soaps/personal care products: no Recent illness: no Recent travel:no History of same: no Context: stable Alleviating factors: hydrocortisone cream Treatments attempted:hydrocortisone cream Shortness of breath: no  Throat/tongue swelling: no Myalgias/arthralgias: no  ALLERGY TEST Patient is requesting to have allergy testing done she would like to see what she is allergic to.  Relevant past medical, surgical, family and social history reviewed and updated as indicated. Interim medical history since our last visit reviewed. Allergies and medications reviewed and updated.  Review of Systems  Genitourinary:       Vaginal itching   Skin: Positive for rash (arms/legs).    Per HPI unless specifically indicated above     Objective:    BP 131/89   Pulse 80   Temp 97.7 F (36.5 C) (Oral)   Wt 263 lb 3.2 oz (119.4 kg)   SpO2 99%   BMI 42.48 kg/m   Wt Readings from Last 3  Encounters:  03/20/21 263 lb 3.2 oz (119.4 kg)  10/18/20 256 lb (116.1 kg)  08/09/20 259 lb (117.5 kg)    Physical Exam Vitals and nursing note reviewed.  Constitutional:      General: She is not in acute distress.    Appearance: Normal appearance. She is obese. She is not ill-appearing, toxic-appearing or diaphoretic.  HENT:     Head: Normocephalic.     Right Ear: External ear normal.     Left Ear: External ear normal.     Nose: Nose normal.     Mouth/Throat:     Mouth: Mucous membranes are moist.     Pharynx: Oropharynx is clear.  Eyes:     General:        Right eye: No discharge.        Left eye: No discharge.     Extraocular Movements: Extraocular movements intact.     Conjunctiva/sclera: Conjunctivae normal.     Pupils: Pupils are equal, round, and reactive to light.  Cardiovascular:     Rate and Rhythm: Normal rate and regular rhythm.     Heart sounds: No murmur heard.   Pulmonary:     Effort: Pulmonary effort is normal. No respiratory distress.     Breath sounds: Normal breath sounds. No wheezing or rales.  Musculoskeletal:     Cervical  back: Normal range of motion and neck supple.  Skin:    General: Skin is warm and dry.     Capillary Refill: Capillary refill takes less than 2 seconds.     Findings: Rash (dark brown macular spots on diffuse on arms.) present.  Neurological:     General: No focal deficit present.     Mental Status: She is alert and oriented to person, place, and time. Mental status is at baseline.  Psychiatric:        Mood and Affect: Mood normal.        Behavior: Behavior normal.        Thought Content: Thought content normal.        Judgment: Judgment normal.     Results for orders placed or performed during the hospital encounter of 10/18/20  Respiratory Panel by RT PCR (Flu A&B, Covid) - Nasopharyngeal Swab   Specimen: Nasopharyngeal Swab  Result Value Ref Range   SARS Coronavirus 2 by RT PCR NEGATIVE NEGATIVE   Influenza A by PCR  NEGATIVE NEGATIVE   Influenza B by PCR NEGATIVE NEGATIVE      Assessment & Plan:   Problem List Items Addressed This Visit      Genitourinary   Bacterial vaginosis - Primary    Complete course of Flagyl.  Return in 3 weeks for repeat test and PAP.      Relevant Medications   metroNIDAZOLE (METROGEL VAGINAL) 0.75 % vaginal gel    Other Visit Diagnoses    Encounter for allergy testing       Recommend patient see allergy for full allergy testing.    Relevant Orders   Ambulatory referral to Allergy   Vaginal itching       Relevant Orders   WET PREP FOR TRICH, YEAST, CLUE   Urinalysis, Routine w reflex microscopic   Urine Culture   Rash       Recommend using Triamcinalone cream to help resolve rash.  BID x 3 weeks.       Follow up plan: Return in 3 weeks (on 04/10/2021) for PAP and BV check .  A total of 30 minutes were spent on this encounter today.  When total time is documented, this includes both the face-to-face and non-face-to-face time personally spent before, during and after the visit on the date of the encounter.

## 2021-03-23 LAB — URINE CULTURE: Organism ID, Bacteria: NO GROWTH

## 2021-03-24 NOTE — Progress Notes (Signed)
Hi Samantha Villegas.  Your urine did not grow any bacteria indicating there is no UTI as discussed in our visit.  Follow up as discussed.

## 2021-04-07 ENCOUNTER — Ambulatory Visit: Payer: Medicaid Other | Admitting: Nurse Practitioner

## 2021-04-07 NOTE — Progress Notes (Signed)
BP 110/69   Pulse 70   Temp 99.2 F (37.3 C)   Wt 263 lb 4 oz (119.4 kg)   SpO2 98%   BMI 42.49 kg/m    Subjective:    Patient ID: Samantha Villegas, female    DOB: 1990/12/13, 30 y.o.   MRN: [id number]  HPI: Samantha Villegas is a 30 y.o. female  Chief Complaint  Patient presents with  . vaginal irritation    Was diagnosed with BV, was treated with the vaginal cream, took it for about 4 days and started to get really irritated so she stopped taking it. Now she is having severe irritation   . Foot Injury    Lump on top of foot, no know injury   . prediabetic    Patient would like to have her A1C checked to see if she is actual diabetic now   Foot Injury Patient states that two days ago a lump came up on her foot.  She states her foot hurts to walk on it or put any pressure on it. Patient denies injury or any change that could have caused the injury.  VAGINAL IRRITATION Patient states she did not complete the course of flagyl due to her cycle starting.  Then her vaginal area became swollen/ tender to the touch.  She is not sure what is going on but knows something isn't right.  Relevant past medical, surgical, family and social history reviewed and updated as indicated. Interim medical history since our last visit reviewed. Allergies and medications reviewed and updated.  Review of Systems  Genitourinary: Positive for genital sores and vaginal pain.  Musculoskeletal:       Lump on top of foot.  Pain with walking    Per HPI unless specifically indicated above     Objective:    BP 110/69   Pulse 70   Temp 99.2 F (37.3 C)   Wt 263 lb 4 oz (119.4 kg)   SpO2 98%   BMI 42.49 kg/m   Wt Readings from Last 3 Encounters:  04/08/21 263 lb 4 oz (119.4 kg)  03/20/21 263 lb 3.2 oz (119.4 kg)  10/18/20 256 lb (116.1 kg)    Physical Exam Vitals and nursing note reviewed. Exam conducted with a chaperone present.  Constitutional:      General: She is not in acute  distress.    Appearance: Normal appearance. She is normal weight. She is not ill-appearing, toxic-appearing or diaphoretic.  HENT:     Head: Normocephalic.     Right Ear: External ear normal.     Left Ear: External ear normal.     Nose: Nose normal.     Mouth/Throat:     Mouth: Mucous membranes are moist.     Pharynx: Oropharynx is clear.  Eyes:     General:        Right eye: No discharge.        Left eye: No discharge.     Extraocular Movements: Extraocular movements intact.     Conjunctiva/sclera: Conjunctivae normal.     Pupils: Pupils are equal, round, and reactive to light.  Cardiovascular:     Rate and Rhythm: Normal rate and regular rhythm.     Heart sounds: No murmur heard.   Pulmonary:     Effort: Pulmonary effort is normal. No respiratory distress.     Breath sounds: Normal breath sounds. No wheezing or rales.  Genitourinary:    Pubic Area: No rash.  Labia:        Right: No rash, tenderness or lesion.        Left: No rash, tenderness or lesion.   Musculoskeletal:     Cervical back: Normal range of motion and neck supple.  Skin:    General: Skin is warm and dry.     Capillary Refill: Capillary refill takes less than 2 seconds.  Neurological:     General: No focal deficit present.     Mental Status: She is alert and oriented to person, place, and time. Mental status is at baseline.  Psychiatric:        Mood and Affect: Mood normal.        Behavior: Behavior normal.        Thought Content: Thought content normal.        Judgment: Judgment normal.     Results for orders placed or performed in visit on 03/20/21  WET PREP FOR TRICH, YEAST, CLUE   Specimen: Sterile Swab   Sterile Swab  Result Value Ref Range   Trichomonas Exam Negative Negative   Yeast Exam Negative Negative   Clue Cell Exam Positive (A) Negative  Urine Culture   Specimen: Urine   UR  Result Value Ref Range   Urine Culture, Routine Final report    Organism ID, Bacteria No growth    Urinalysis, Routine w reflex microscopic  Result Value Ref Range   Specific Gravity, UA 1.025 1.005 - 1.030   pH, UA 5.5 5.0 - 7.5   Color, UA Yellow Yellow   Appearance Ur Clear Clear   Leukocytes,UA Negative Negative   Protein,UA Negative Negative/Trace   Glucose, UA Negative Negative   Ketones, UA Negative Negative   RBC, UA Negative Negative   Bilirubin, UA Negative Negative   Urobilinogen, Ur 0.2 0.2 - 1.0 mg/dL   Nitrite, UA Negative Negative      Assessment & Plan:   Problem List Items Addressed This Visit   None   Visit Diagnoses    Vaginal irritation    -  Primary   Wet prep and vaginal exam neg reveal an abnormalities. Cream given. UA/UC sent.  Due to history of cdiff, will wait to see results before treating w/ antibiotic   Relevant Medications   ketoconazole (NIZORAL) 2 % cream   Lactobacillus Extra Strength CAPS   Other Relevant Orders   WET PREP FOR TRICH, YEAST, CLUE   Urinalysis, Routine w reflex microscopic   Urine Culture   Right foot pain       Obtain xray of foot.  Will make recommendations based on imaging results.    Relevant Orders   DG Foot Complete Right   Dysuria       Relevant Orders   Urinalysis, Routine w reflex microscopic   Urine Culture       Follow up plan: Return if symptoms worsen or fail to improve.

## 2021-04-08 ENCOUNTER — Ambulatory Visit (INDEPENDENT_AMBULATORY_CARE_PROVIDER_SITE_OTHER): Payer: Medicaid Other | Admitting: Nurse Practitioner

## 2021-04-08 ENCOUNTER — Encounter: Payer: Self-pay | Admitting: Nurse Practitioner

## 2021-04-08 ENCOUNTER — Other Ambulatory Visit: Payer: Self-pay

## 2021-04-08 VITALS — BP 110/69 | HR 70 | Temp 99.2°F | Wt 263.2 lb

## 2021-04-08 DIAGNOSIS — N898 Other specified noninflammatory disorders of vagina: Secondary | ICD-10-CM

## 2021-04-08 DIAGNOSIS — R300 Dysuria: Secondary | ICD-10-CM

## 2021-04-08 DIAGNOSIS — M79671 Pain in right foot: Secondary | ICD-10-CM

## 2021-04-08 LAB — URINALYSIS, ROUTINE W REFLEX MICROSCOPIC
Bilirubin, UA: NEGATIVE
Glucose, UA: NEGATIVE
Ketones, UA: NEGATIVE
Nitrite, UA: NEGATIVE
Protein,UA: NEGATIVE
RBC, UA: NEGATIVE
Specific Gravity, UA: 1.025 (ref 1.005–1.030)
Urobilinogen, Ur: 0.2 mg/dL (ref 0.2–1.0)
pH, UA: 6.5 (ref 5.0–7.5)

## 2021-04-08 LAB — WET PREP FOR TRICH, YEAST, CLUE
Clue Cell Exam: NEGATIVE
Trichomonas Exam: NEGATIVE
Yeast Exam: NEGATIVE

## 2021-04-08 LAB — MICROSCOPIC EXAMINATION
Bacteria, UA: NONE SEEN
Epithelial Cells (non renal): NONE SEEN /hpf (ref 0–10)
RBC, Urine: NONE SEEN /hpf (ref 0–2)

## 2021-04-08 MED ORDER — KETOCONAZOLE 2 % EX CREA: 1.0000 "application " | TOPICAL_CREAM | Freq: Every day | CUTANEOUS | 0 refills | Status: DC

## 2021-04-08 MED ORDER — LACTOBACILLUS EXTRA STRENGTH PO CAPS: 1.0000 | ORAL_CAPSULE | Freq: Every day | ORAL | 1 refills | Status: DC

## 2021-04-09 LAB — HEPATITIS C ANTIBODY: Hep C Virus Ab: <0.1 s/co ratio (ref 0.0–0.9)

## 2021-04-09 NOTE — Progress Notes (Signed)
Hi Dynver.  Your hepatitis C blood work is negative.  The other lab work shows what we discussed yesterday in the office.  I am waiting for your culture to come back and I will follow up with you.  Let me know if you have other questions.

## 2021-04-10 ENCOUNTER — Ambulatory Visit: Payer: Medicaid Other | Admitting: Nurse Practitioner

## 2021-04-10 LAB — URINE CULTURE

## 2021-04-10 NOTE — Progress Notes (Signed)
Hi Samantha Villegas.  There was no growth on your urine culture.  There is no need for antibiotics. How is the cream working for your symptoms.

## 2021-06-16 ENCOUNTER — Other Ambulatory Visit: Payer: Self-pay | Admitting: Nurse Practitioner

## 2021-08-12 ENCOUNTER — Ambulatory Visit: Payer: Self-pay

## 2021-08-12 NOTE — Telephone Encounter (Signed)
Routing to provider who patient is scheduled with as an FYI for upcoming appointment.

## 2021-08-12 NOTE — Telephone Encounter (Signed)
Pt. Reports she was bathing her child and hurt her low back. "I heard something pop." Pain with movement and walking. Has an appointment Thursday.   Reason for Disposition  [1] After 3 days (72 hours) AND [2] back pain not improving  Answer Assessment - Initial Assessment Questions 1. MECHANISM: "How did the injury happen?" (Consider the possibility of domestic violence or elder abuse)     Bathing children 2. ONSET: "When did the injury happen?" (Minutes or hours ago)     Saturday 3. LOCATION: "What part of the back is injured?"     Lower back 4. SEVERITY: "Can you move the back normally?"     Severe 5. PAIN: "Is there any pain?" If Yes, ask: "How bad is the pain?"   (Scale 1-10; or mild, moderate, severe)     10 6. CORD SYMPTOMS: Any weakness or numbness of the arms or legs?"     No 7. SIZE: For cuts, bruises, or swelling, ask: "How large is it?" (e.g., inches or centimeters)     No 8. TETANUS: For any breaks in the skin, ask: "When was the last tetanus booster?"     N/a 9. OTHER SYMPTOMS: "Do you have any other symptoms?" (e.g., abdominal pain, blood in urine)     No 10. PREGNANCY: "Is there any chance you are pregnant?" "When was your last menstrual period?"       No  Protocols used: Back Injury-A-AH

## 2021-08-14 ENCOUNTER — Encounter: Payer: Self-pay | Admitting: Nurse Practitioner

## 2021-08-14 ENCOUNTER — Ambulatory Visit (INDEPENDENT_AMBULATORY_CARE_PROVIDER_SITE_OTHER): Payer: Medicaid Other | Admitting: Nurse Practitioner

## 2021-08-14 ENCOUNTER — Other Ambulatory Visit: Payer: Self-pay

## 2021-08-14 ENCOUNTER — Ambulatory Visit
Admission: RE | Admit: 2021-08-14 | Discharge: 2021-08-14 | Disposition: A | Discharge status: Home / Self Care | Payer: Medicaid Other | Source: Ambulatory Visit | Attending: Nurse Practitioner | Admitting: Nurse Practitioner

## 2021-08-14 ENCOUNTER — Ambulatory Visit
Admission: RE | Admit: 2021-08-14 | Discharge: 2021-08-14 | Disposition: A | Discharge status: Home / Self Care | Payer: Medicaid Other | Attending: Nurse Practitioner | Admitting: Nurse Practitioner

## 2021-08-14 VITALS — BP 122/76 | HR 77 | Temp 99.0°F | Wt 262.2 lb

## 2021-08-14 DIAGNOSIS — B9689 Other specified bacterial agents as the cause of diseases classified elsewhere: Secondary | ICD-10-CM | POA: Diagnosis not present

## 2021-08-14 DIAGNOSIS — R21 Rash and other nonspecific skin eruption: Secondary | ICD-10-CM | POA: Diagnosis not present

## 2021-08-14 DIAGNOSIS — M545 Low back pain, unspecified: Secondary | ICD-10-CM

## 2021-08-14 DIAGNOSIS — N760 Acute vaginitis: Secondary | ICD-10-CM | POA: Diagnosis not present

## 2021-08-14 LAB — URINALYSIS, ROUTINE W REFLEX MICROSCOPIC
Bilirubin, UA: NEGATIVE
Glucose, UA: NEGATIVE
Ketones, UA: NEGATIVE
Leukocytes,UA: NEGATIVE
Nitrite, UA: NEGATIVE
Protein,UA: NEGATIVE
RBC, UA: NEGATIVE
Specific Gravity, UA: 1.025 (ref 1.005–1.030)
Urobilinogen, Ur: 0.2 mg/dL (ref 0.2–1.0)
pH, UA: 5.5 (ref 5.0–7.5)

## 2021-08-14 LAB — WET PREP FOR TRICH, YEAST, CLUE
Clue Cell Exam: POSITIVE — AB
Trichomonas Exam: NEGATIVE
Yeast Exam: NEGATIVE

## 2021-08-14 MED ORDER — PREDNISONE 10 MG PO TABS: ORAL_TABLET | ORAL | 0 refills | Status: DC

## 2021-08-14 MED ORDER — METRONIDAZOLE 0.75 % VA GEL: 1.0000 | Freq: Two times a day (BID) | VAGINAL | 0 refills | Status: DC

## 2021-08-14 MED ORDER — LORATADINE 10 MG PO TABS: 10.0000 mg | ORAL_TABLET | Freq: Every day | ORAL | 11 refills | Status: DC

## 2021-08-14 MED ORDER — CYCLOBENZAPRINE HCL 10 MG PO TABS: 10.0000 mg | ORAL_TABLET | Freq: Three times a day (TID) | ORAL | 0 refills | Status: DC | PRN

## 2021-08-14 MED ORDER — KETOROLAC TROMETHAMINE 60 MG/2ML IM SOLN
60.0000 mg | Freq: Once | INTRAMUSCULAR | Status: AC
  Administered 2021-08-14: 60 mg via INTRAMUSCULAR

## 2021-08-14 MED ORDER — METRONIDAZOLE 500 MG PO TABS: 500.0000 mg | ORAL_TABLET | Freq: Two times a day (BID) | ORAL | 0 refills | Status: DC

## 2021-08-14 NOTE — Progress Notes (Signed)
BP 122/76   Pulse 77   Temp 99 F (37.2 C) (Oral)   Wt 262 lb 3.2 oz (118.9 kg)   SpO2 98%   BMI 42.32 kg/m    Subjective:    Patient ID: Samantha Villegas, female    DOB: 09-14-1991, 30 y.o.   MRN: [id number]  HPI: Samantha Villegas is a 30 y.o. female  Chief Complaint  Patient presents with   Back Pain    Patient states she was giving her kids a bath on Saturday and she heard a pop. Patient states her whole right side had a tingling sensation and states it took her a minute to move as she was bend over giving the kids a bath. Patient states she has a burning sensation in the middle in her back. Patient states when she sitting her lower right side is like pin needle throbbing sensation.    Medication Problem    Patient states she was diagnosed with contact dermatitis and states she was prescribed Kenalog cream and states it does not help and she notices some faint rashes on her hands today. Patient states it itches really bad.    BACK PAIN Started on Saturday after twisting giving her children a bath and something popped.  The whole right side went numb and then came back.  She just finished a 3 day script for UTI. Duration: days Mechanism of injury:  twisting when giving children a bath Location: midline Onset: gradual Severity: 6/10 Quality: sharp, aching, burning, and throbbing Frequency: constant Radiation:  did first two days, but no longer Aggravating factors: lifting, movement, walking, and bending Alleviating factors:  Bengay, patches and NSAIDs Status: fluctuating Treatments attempted:  lidocaine patches and ibuprofen  Relief with NSAIDs?: moderate Nighttime pain:   yes Paresthesias / decreased sensation:  no Bowel / bladder incontinence:  no Fevers:  no Dysuria / urinary frequency:  no   RASH Seen in April for rash and reports she was treated for contact dermatitis -- little spots continue to come up on her hands and chest.  She is not sure of  exposures.  Was treated with Kenalog cream, but not helping. Duration:  chronic  Location:  hands and chest   Itching: yes Burning: yes Redness: yes Oozing: no Scaling: no Blisters: no Painful: no Fevers: no Change in detergents/soaps/personal care products: no Recent illness: no Recent travel:no History of same: yes Context: fluctuating Alleviating factors:  Kenalog and hydrocortisone cream Treatments attempted: Kenalog Shortness of breath: no  Throat/tongue swelling: no Myalgias/arthralgias: no   Relevant past medical, surgical, family and social history reviewed and updated as indicated. Interim medical history since our last visit reviewed. Allergies and medications reviewed and updated.  Review of Systems  Constitutional:  Negative for activity change, appetite change, diaphoresis, fatigue and fever.  Respiratory:  Negative for cough, chest tightness and shortness of breath.   Cardiovascular:  Negative for chest pain, palpitations and leg swelling.  Musculoskeletal:  Positive for back pain.  Skin:  Positive for rash.  Neurological: Negative.   Psychiatric/Behavioral: Negative.     Per HPI unless specifically indicated above     Objective:    BP 122/76   Pulse 77   Temp 99 F (37.2 C) (Oral)   Wt 262 lb 3.2 oz (118.9 kg)   SpO2 98%   BMI 42.32 kg/m   Wt Readings from Last 3 Encounters:  08/14/21 262 lb 3.2 oz (118.9 kg)  04/08/21 263 lb 4 oz (119.4 kg)  03/20/21 263 lb 3.2 oz (119.4 kg)    Physical Exam Vitals and nursing note reviewed.  Constitutional:      General: She is awake. She is not in acute distress.    Appearance: She is well-developed and well-groomed. She is obese. She is not ill-appearing or toxic-appearing.  HENT:     Head: Normocephalic.     Right Ear: Hearing normal.     Left Ear: Hearing normal.  Eyes:     General: Lids are normal.        Right eye: No discharge.        Left eye: No discharge.     Conjunctiva/sclera: Conjunctivae  normal.     Pupils: Pupils are equal, round, and reactive to light.  Neck:     Thyroid: No thyromegaly.     Vascular: No carotid bruit.  Cardiovascular:     Rate and Rhythm: Normal rate and regular rhythm.     Heart sounds: Normal heart sounds. No murmur heard.   No gallop.  Pulmonary:     Effort: Pulmonary effort is normal. No accessory muscle usage or respiratory distress.     Breath sounds: Normal breath sounds.  Abdominal:     General: Bowel sounds are normal.     Palpations: Abdomen is soft.  Musculoskeletal:     Cervical back: Normal range of motion and neck supple.     Lumbar back: Spasms and tenderness present. No swelling, edema or bony tenderness. Decreased range of motion. Negative right straight leg raise test and negative left straight leg raise test.     Right lower leg: No edema.     Left lower leg: No edema.  Lymphadenopathy:     Cervical: No cervical adenopathy.  Skin:    General: Skin is warm and dry.     Findings: Rash present.     Comments: Patchy areas of erythema to bilateral lower arms noted + small round areas of raised erythema to upper chest, scattered.  Neurological:     Mental Status: She is alert and oriented to person, place, and time.  Psychiatric:        Attention and Perception: Attention normal.        Mood and Affect: Mood normal.        Speech: Speech normal.        Behavior: Behavior normal. Behavior is cooperative.        Thought Content: Thought content normal.   Back Exam:    Inspection:  Normal spinal curvature.  No deformity, ecchymosis, erythema, or lesions   Curvature: Normal   Deformity: no  Ecchymosis: none  Erythema:  none  Lesions: no    Palpation:     Midline spinal tenderness: none              Paralumbar tenderness: yes Right     Parathoracic tenderness: none     Buttocks tenderness: none     Range of Motion:      Flexion: Fingers to Knees     Extension:Normal     Lateral bending:Decreased    Rotation:Normal     Neuro Exam:Lower extremity DTRs normal & symmetric.  Strength and sensation intact.    Special Tests:      Straight leg raise:negative     Crossed straight leg raise:negative      Results for orders placed or performed in visit on 08/14/21  WET PREP FOR TRICH, YEAST, CLUE   Specimen: Sterile Swab   Sterile Swab  Result Value Ref Range   Trichomonas Exam Negative Negative   Yeast Exam Negative Negative   Clue Cell Exam Positive (A) Negative  Urinalysis, Routine w reflex microscopic  Result Value Ref Range   Specific Gravity, UA 1.025 1.005 - 1.030   pH, UA 5.5 5.0 - 7.5   Color, UA Yellow Yellow   Appearance Ur Clear Clear   Leukocytes,UA Negative Negative   Protein,UA Negative Negative/Trace   Glucose, UA Negative Negative   Ketones, UA Negative Negative   RBC, UA Negative Negative   Bilirubin, UA Negative Negative   Urobilinogen, Ur 0.2 0.2 - 1.0 mg/dL   Nitrite, UA Negative Negative      Assessment & Plan:   Problem List Items Addressed This Visit       Musculoskeletal and Integument   Rash    Ongoing, will trial Prednisone taper and recommend she continue Triamcinolone cream to areas.  Referral to allergist for further testing.  Script for Claritin sent.  Return for worsening or ongoing.  Consider referral to dermatology if ongoing.      Relevant Orders   Ambulatory referral to Allergy     Genitourinary   Bacterial vaginosis    Acute and noted on wet prep today, with negative UA.  Will treat with Flagyl gel, has tolerated this in past.  Continues to have recurrence of this.  Recommend she schedule follow-up with GYN to further evaluate.        Other   Acute midline low back pain without sciatica - Primary    Acute, post pull while bathing children.  Suspect more muscular, but will obtain imaging to further assess.  Discussed with patient.  Toradol injection in office today for pain relief and will send in Prednisone taper + Flexeril to take as needed.  Discussed  with her not to drive with Flexeril, take this more at night as needed for comfort.  Recommend trial of heat and ice alternating + OTC Voltaren gel.  Return to office as needed for worsening or ongoing pain.      Relevant Medications   predniSONE (DELTASONE) 10 MG tablet   cyclobenzaprine (FLEXERIL) 10 MG tablet   Other Relevant Orders   WET PREP FOR TRICH, YEAST, CLUE (Completed)   Urinalysis, Routine w reflex microscopic (Completed)   DG Lumbar Spine Complete     Follow up plan: Return if symptoms worsen or fail to improve.

## 2021-08-14 NOTE — Patient Instructions (Signed)

## 2021-08-14 NOTE — Assessment & Plan Note (Signed)
Acute, post pull while bathing children.  Suspect more muscular, but will obtain imaging to further assess.  Discussed with patient.  Toradol injection in office today for pain relief and will send in Prednisone taper + Flexeril to take as needed.  Discussed with her not to drive with Flexeril, take this more at night as needed for comfort.  Recommend trial of heat and ice alternating + OTC Voltaren gel.  Return to office as needed for worsening or ongoing pain.

## 2021-08-14 NOTE — Assessment & Plan Note (Signed)
Acute and noted on wet prep today, with negative UA.  Will treat with Flagyl gel, has tolerated this in past.  Continues to have recurrence of this.  Recommend she schedule follow-up with GYN to further evaluate.

## 2021-08-14 NOTE — Assessment & Plan Note (Signed)
Ongoing, will trial Prednisone taper and recommend she continue Triamcinolone cream to areas.  Referral to allergist for further testing.  Script for Claritin sent.  Return for worsening or ongoing.  Consider referral to dermatology if ongoing.

## 2021-08-16 NOTE — Progress Notes (Signed)
Contacted via MyChart   Good evening Loye, hope your back is doing well.  Your imaging did return, no acute injury noted.  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  It is possible you have a little pinched nerve there from recent incident and the steroids should help.  If any questions or pain continues please let us know.  Any questions? Keep being amazing!!  Thank you for allowing me to participate in your care.  I appreciate you. Kindest regards, Daris Aristizabal

## 2021-10-22 DIAGNOSIS — N941 Unspecified dyspareunia: Secondary | ICD-10-CM | POA: Insufficient documentation

## 2021-12-30 NOTE — Progress Notes (Signed)
BP 109/72    Pulse 86    Temp 98.1 F (36.7 C) (Oral)    SpO2 98%    Subjective:    Patient ID: Samantha Villegas, female    DOB: [birth year], 31 y.o.   MRN: [id number]  HPI: Morrison Masser is a 31 y.o. female  Chief Complaint  Patient presents with   URI    Pt states she started having ear pain 2 days ago. States last night she started having congestion, sore throat, and cough last night    EAR PAIN Duration: days Involved ear(s): left Severity:  7/10  Quality:  throbbing, ache and sharp Fever: no Otorrhea: no Upper respiratory infection symptoms: yes Pruritus: yes Hearing loss: yes Water immersion no Using Q-tips: no Recurrent otitis media: no Status: stable Treatments attempted: none Coughing up thick mucus with blood in it.  States she woke up congested and hurts to swallow.    Relevant past medical, surgical, family and social history reviewed and updated as indicated. Interim medical history since our last visit reviewed. Allergies and medications reviewed and updated.  Review of Systems  HENT:  Positive for congestion, ear pain and sore throat.    Per HPI unless specifically indicated above     Objective:    BP 109/72    Pulse 86    Temp 98.1 F (36.7 C) (Oral)    SpO2 98%   Wt Readings from Last 3 Encounters:  08/14/21 262 lb 3.2 oz (118.9 kg)  04/08/21 263 lb 4 oz (119.4 kg)  03/20/21 263 lb 3.2 oz (119.4 kg)    Physical Exam Vitals and nursing note reviewed.  Constitutional:      General: She is not in acute distress.    Appearance: Normal appearance. She is normal weight. She is not ill-appearing, toxic-appearing or diaphoretic.  HENT:     Head: Normocephalic.     Right Ear: Tympanic membrane and external ear normal.     Left Ear: Tympanic membrane and external ear normal.     Nose: Congestion and rhinorrhea present.     Right Sinus: No maxillary sinus tenderness or frontal sinus tenderness.     Left Sinus: No maxillary sinus  tenderness or frontal sinus tenderness.     Mouth/Throat:     Mouth: Mucous membranes are moist.     Pharynx: Oropharynx is clear. Posterior oropharyngeal erythema present. No oropharyngeal exudate.  Eyes:     General:        Right eye: No discharge.        Left eye: No discharge.     Extraocular Movements: Extraocular movements intact.     Conjunctiva/sclera: Conjunctivae normal.     Pupils: Pupils are equal, round, and reactive to light.  Cardiovascular:     Rate and Rhythm: Normal rate and regular rhythm.     Heart sounds: No murmur heard. Pulmonary:     Effort: Pulmonary effort is normal. No respiratory distress.     Breath sounds: Normal breath sounds. No wheezing or rales.  Musculoskeletal:     Cervical back: Normal range of motion and neck supple.  Skin:    General: Skin is warm and dry.     Capillary Refill: Capillary refill takes less than 2 seconds.  Neurological:     General: No focal deficit present.     Mental Status: She is alert and oriented to person, place, and time. Mental status is at baseline.  Psychiatric:  Mood and Affect: Mood normal.        Behavior: Behavior normal.        Thought Content: Thought content normal.        Judgment: Judgment normal.    Results for orders placed or performed in visit on 08/14/21  WET PREP FOR TRICH, YEAST, CLUE   Specimen: Sterile Swab   Sterile Swab  Result Value Ref Range   Trichomonas Exam Negative Negative   Yeast Exam Negative Negative   Clue Cell Exam Positive (A) Negative  Urinalysis, Routine w reflex microscopic  Result Value Ref Range   Specific Gravity, UA 1.025 1.005 - 1.030   pH, UA 5.5 5.0 - 7.5   Color, UA Yellow Yellow   Appearance Ur Clear Clear   Leukocytes,UA Negative Negative   Protein,UA Negative Negative/Trace   Glucose, UA Negative Negative   Ketones, UA Negative Negative   RBC, UA Negative Negative   Bilirubin, UA Negative Negative   Urobilinogen, Ur 0.2 0.2 - 1.0 mg/dL   Nitrite,  UA Negative Negative      Assessment & Plan:   Problem List Items Addressed This Visit   None Visit Diagnoses     Viral upper respiratory tract infection    -  Primary   Prednisone given due to patient's symptoms. Complete course. ER precautions discussed. FU in office if symptoms do not improve.   Acute cough       Relevant Orders   Novel Coronavirus, NAA ([facility])   Veritor Flu A/B Waived   Sore throat       Relevant Orders   Rapid Strep screen([facility])        Follow up plan: Return if symptoms worsen or fail to improve.

## 2021-12-31 ENCOUNTER — Other Ambulatory Visit: Payer: Self-pay

## 2021-12-31 ENCOUNTER — Ambulatory Visit (INDEPENDENT_AMBULATORY_CARE_PROVIDER_SITE_OTHER): Payer: Medicaid Other | Admitting: Nurse Practitioner

## 2021-12-31 ENCOUNTER — Encounter: Payer: Self-pay | Admitting: Nurse Practitioner

## 2021-12-31 VITALS — BP 109/72 | HR 86 | Temp 98.1°F

## 2021-12-31 DIAGNOSIS — R051 Acute cough: Secondary | ICD-10-CM

## 2021-12-31 DIAGNOSIS — J029 Acute pharyngitis, unspecified: Secondary | ICD-10-CM

## 2021-12-31 DIAGNOSIS — J069 Acute upper respiratory infection, unspecified: Secondary | ICD-10-CM

## 2021-12-31 MED ORDER — ALBUTEROL SULFATE HFA 108 (90 BASE) MCG/ACT IN AERS: 2.0000 | INHALATION_SPRAY | Freq: Four times a day (QID) | RESPIRATORY_TRACT | 3 refills | Status: DC | PRN

## 2021-12-31 MED ORDER — PREDNISONE 10 MG PO TABS: 10.0000 mg | ORAL_TABLET | Freq: Every day | ORAL | 0 refills | Status: DC

## 2022-01-01 LAB — NOVEL CORONAVIRUS, NAA: SARS-CoV-2, NAA: NOT DETECTED

## 2022-01-01 LAB — SARS-COV-2, NAA 2 DAY TAT

## 2022-01-01 NOTE — Progress Notes (Signed)
Results discussed with patient during visit.

## 2022-01-01 NOTE — Progress Notes (Signed)
Hi Samantha Villegas.  Your COVID test was negative.

## 2022-01-04 ENCOUNTER — Encounter: Payer: Self-pay | Admitting: Nurse Practitioner

## 2022-01-04 LAB — RAPID STREP SCREEN (MED CTR MEBANE ONLY): Strep Gp A Ag, IA W/Reflex: NEGATIVE

## 2022-01-04 LAB — CULTURE, GROUP A STREP

## 2022-01-04 LAB — VERITOR FLU A/B WAIVED
Influenza A: NEGATIVE
Influenza B: NEGATIVE

## 2022-01-05 ENCOUNTER — Telehealth: Payer: Self-pay

## 2022-01-05 MED ORDER — AMOXICILLIN 500 MG PO CAPS: 500.0000 mg | ORAL_CAPSULE | Freq: Two times a day (BID) | ORAL | 0 refills | Status: AC

## 2022-01-05 NOTE — Progress Notes (Signed)
Please let patient know that her strep culture grew Beta hemolytic strep.  This is not the normal strep we seen with Strep throat. However, we can still take care of this with antibiotics.  I have sent Amoxicillin to the pharmacy for her.  Please let me know if she has any questions.

## 2022-01-05 NOTE — Telephone Encounter (Signed)
Samantha Villegas, spoke with patient about her test results and she asked about the phone call that she missed from you on Friday

## 2022-01-05 NOTE — Addendum Note (Signed)
Addended by: Larae Grooms on: 01/05/2022 08:52 AM   Modules accepted: Orders

## 2022-01-05 NOTE — Progress Notes (Signed)
I'm not able to tell her how she grew the abnormal strep.  Unfortunately, there is no way for me to know.

## 2022-02-10 ENCOUNTER — Ambulatory Visit: Payer: Self-pay | Admitting: *Deleted

## 2022-02-10 NOTE — Telephone Encounter (Signed)
?  Chief Complaint: pain, jaw, foot, knee, abd, back ?Symptoms: mostly called about jaw ?Frequency: constant ?Pertinent Negatives: Patient denies fever, rash ?Disposition: [] ED /[] Urgent Care (no appt availability in office) / [x] Appointment(In office/virtual)/ []  [place] [facility]/ [] Home Care/ [] Refused Recommended Disposition /[] [place] [facility]/ []  Follow-up with PCP ?Additional Notes: Pt seen recently for jaw pain, took Amox, did not help, has muscle relaxer that does help, now pain in mutiple areas. Appt made. ? ? ?Reason for Disposition ? Face pain present > 24 hours ? ?Answer Assessment - Initial Assessment Questions ?1. ONSET: "When did the pain start?" (e.g., minutes, hours, days) ?    Over a onth ?2. ONSET: "Does the pain come and go, or has it been constant since it started?" (e.g., constant, intermittent, fleeting) ?    Saw MD, took amoxicillin did not help, jaw used to pop, not popping now but still painful ?3. SEVERITY: "How bad is the pain?"   (Scale 1-10; mild, moderate or severe) ?  - MILD (1-3): doesn't interfere with normal activities  ?  - MODERATE (4-7): interferes with normal activities or awakens from sleep  ?  - SEVERE (8-10): excruciating pain, unable to do any normal activities  ?    Goes from mild to severe when moves it ?4. LOCATION: "Where does it hurt?"  ?    Right jaw, also right knee, right knee, abd, back ?5. RASH: "Is there any redness, rash, or swelling of the face?" ?    no ?6. FEVER: "Do you have a fever?" If Yes, ask: "What is it, how was it measured, and when did it start?"  ?    no ?7. OTHER SYMPTOMS: "Do you have any other symptoms?" (e.g., fever, toothache, nasal discharge, nasal congestion, clicking sensation in jaw joint) ?    Jaw Not popping currently ?8. PREGNANCY: "Is there any chance you are pregnant?" "When was your last menstrual period?" ?    No LMP a long time ago due to depo ? ?Protocols used: Face Pain-A-AH ? ?

## 2022-02-11 ENCOUNTER — Ambulatory Visit
Admission: RE | Admit: 2022-02-11 | Discharge: 2022-02-11 | Disposition: A | Discharge status: Home / Self Care | Payer: Medicaid Other | Attending: Nurse Practitioner | Admitting: Nurse Practitioner

## 2022-02-11 ENCOUNTER — Other Ambulatory Visit: Payer: Self-pay

## 2022-02-11 ENCOUNTER — Ambulatory Visit
Admission: RE | Admit: 2022-02-11 | Discharge: 2022-02-11 | Disposition: A | Discharge status: Home / Self Care | Payer: Medicaid Other | Source: Ambulatory Visit | Attending: Nurse Practitioner | Admitting: Nurse Practitioner

## 2022-02-11 ENCOUNTER — Ambulatory Visit (INDEPENDENT_AMBULATORY_CARE_PROVIDER_SITE_OTHER): Payer: Medicaid Other | Admitting: Nurse Practitioner

## 2022-02-11 ENCOUNTER — Encounter: Payer: Self-pay | Admitting: Nurse Practitioner

## 2022-02-11 VITALS — BP 108/73 | HR 99 | Temp 99.0°F | Wt 262.2 lb

## 2022-02-11 DIAGNOSIS — M26622 Arthralgia of left temporomandibular joint: Secondary | ICD-10-CM

## 2022-02-11 DIAGNOSIS — M2550 Pain in unspecified joint: Secondary | ICD-10-CM | POA: Diagnosis not present

## 2022-02-11 DIAGNOSIS — M2011 Hallux valgus (acquired), right foot: Secondary | ICD-10-CM | POA: Diagnosis not present

## 2022-02-11 DIAGNOSIS — Z6841 Body Mass Index (BMI) 40.0 and over, adult: Secondary | ICD-10-CM

## 2022-02-11 MED ORDER — CYCLOBENZAPRINE HCL 10 MG PO TABS: 10.0000 mg | ORAL_TABLET | Freq: Three times a day (TID) | ORAL | 1 refills | Status: DC | PRN

## 2022-02-11 MED ORDER — BUPROPION HCL ER (XL) 150 MG PO TB24: ORAL_TABLET | ORAL | 1 refills | Status: DC

## 2022-02-11 NOTE — Assessment & Plan Note (Signed)
BMI 42.32.  Recommended eating smaller high protein, low fat meals more frequently and exercising 30 mins a day 5 times a week with a goal of 10-15lb weight loss in the next 3 months. Patient voiced their understanding and motivation to adhere to these recommendations. ? ?

## 2022-02-11 NOTE — Patient Instructions (Signed)
Temporomandibular Joint Syndrome °Temporomandibular joint syndrome (TMJ syndrome) is a condition that causes pain in the temporomandibular joints. These joints are located near your ears and allow your jaw to open and close. For people with TMJ syndrome, chewing, biting, or other movements of the jaw can be difficult or painful. °TMJ syndrome is often mild and goes away within a few weeks. However, sometimes the condition becomes a long-term (chronic) problem. °What are the causes? °This condition may be caused by: °Grinding your teeth or clenching your jaw. Some people do this when they are stressed. °Arthritis. °An injury to the jaw. °A head or neck injury. °Teeth or dentures that are not aligned well. °In some cases, the cause of TMJ syndrome may not be known. °What are the signs or symptoms? °The most common symptom of this condition is aching pain on the side of the head in the area of the TMJ. Other symptoms may include: °Pain when moving your jaw, such as when chewing or biting. °Not being able to open your jaw all the way. °Making a clicking sound when you open your mouth. °Headache. °Earache. °Neck or shoulder pain. °How is this diagnosed? °This condition may be diagnosed based on: °Your symptoms and medical history. °A physical exam. Your health care provider may check the range of motion of your jaw. °Imaging tests, such as X-rays or an MRI. °You may also need to see your dentist, who will check if your teeth and jaw are lined up correctly. °How is this treated? °TMJ syndrome often goes away on its own. If treatment is needed, it may include: °Eating soft foods and applying ice or heat. °Medicines to relieve pain or inflammation. °Medicines or massage to relax the muscles. °A splint, bite plate, or mouthpiece to prevent teeth grinding or jaw clenching. °Relaxation techniques or counseling to help reduce stress. °A therapy for pain in which an electrical current is applied to the nerves through the skin  (transcutaneous electrical nerve stimulation). °Acupuncture. This may help to relieve pain. °Jaw surgery. This is rarely needed. °Follow these instructions at home: °Eating and drinking °Eat a soft diet if you are having trouble chewing. °Avoid foods that require a lot of chewing. Do not chew gum. °General instructions °Take over-the-counter and prescription medicines only as told by your health care provider. °If directed, put ice on the painful area. To do this: °Put ice in a plastic bag. °Place a towel between your skin and the bag. °Leave the ice on for 20 minutes, 2-3 times a day. °Remove the ice if your skin turns bright red. This is very important. If you cannot feel pain, heat, or cold, you have a greater risk of damage to the area. °Apply a warm, wet cloth (warm compress) to the painful area as told. °Massage your jaw area and do any jaw stretching exercises as told by your health care provider. °If you were given a splint, bite plate, or mouthpiece, wear it as told by your health care provider. °Keep all follow-up visits. This is important. °Where to find more information °[employer]: www.nidcr.nih.gov °Contact a health care provider if: °You have trouble eating. °You have new or worsening symptoms. °Get help right away if: °Your jaw locks. °Summary °Temporomandibular joint syndrome (TMJ syndrome) is a condition that causes pain in the temporomandibular joints. These joints are located near your ears and allow your jaw to open and close. °TMJ syndrome is often mild and goes away within a few   weeks. However, sometimes the condition becomes a long-term (chronic) problem. °Symptoms include an aching pain on the side of the head in the area of the TMJ, pain when chewing or biting, and being unable to open your jaw all the way. You may also make a clicking sound when you open your mouth. °TMJ syndrome often goes away on its own. If treatment is needed, it may include  medicines to relieve pain, reduce inflammation, or relax the muscles. A splint, bite plate, or mouthpiece may also be used to prevent teeth grinding or jaw clenching. °This information is not intended to replace advice given to you by your health care provider. Make sure you discuss any questions you have with your health care provider. °Document Revised: 07/06/2021 Document Reviewed: 07/06/2021 °Elsevier Patient Education © 2022 [employer] ° °

## 2022-02-11 NOTE — Assessment & Plan Note (Signed)
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  At this time will get her into PT for this and consider imaging via CT in future if ongoing.  Recommend alternate ice and heat at home + take Ibuprofen or Tylenol as needed.  May also rub Voltaren gel onto jaw line and massage for comfort.  Avoid chewing gum or harder foods until symptoms improved.   ?

## 2022-02-11 NOTE — Assessment & Plan Note (Signed)
To right knee & foot, lower back, and jaw.  Would benefit from modest weight loss, which was discussed.  At this time will obtain imaging of right knee and foot.  Recommend Tylenol as needed for pain + may use OTC creams like Icy/Hot and Voltaren.  Will obtain labs today due to mother with RA and her concerns about this + joint pain being present: CBC, CMP, TSH, ANA, CRP, ESR, B12, iron and ferritin.  Could consider tick borne blood work in future if ongoing and current labs reassuring.  Continue simple treatment and consider PT in future. ?

## 2022-02-11 NOTE — Progress Notes (Signed)
° °BP 108/73    Pulse 99    Temp 99 °F (37.2 °C) (Oral)    Wt 262 lb 3.2 oz (118.9 kg)    SpO2 96%    BMI 42.32 kg/m²   ° °Subjective:  ° ° Patient ID: Samantha Villegas, female    DOB: 05/02/1991, 30 y.o.   MRN: [id number] ° °HPI: °Samantha Villegas is a 30 y.o. female ° °Chief Complaint  °Patient presents with  ° Pain  ° Jaw Pain  °  Patient states she is having issues with her jaw pain (L jaw). 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 Patient states it is starting to effect her ear cavity.   ° Knee Pain  °  Patient states she is having knee pain in her right knee that radiates down to her right foot. Patient states her mother was recently diagnosed with Rheumatoid Arthritis.   ° Foot Pain  °  Patient states she has discomfort on the top of her right foot. Patient states occasionally she will feel a knot on her foot.   ° Back Pain  °  Patient states she has been taking her Muscle Relaxer to help and it typically takes the pain away for a little while but not long.  °  ° °ARTHRALGIAS / JOINT ACHES °Having multiple areas of joint pain reported.  Has always had issues with jaw she reports -- pain recently started with her sore throat which was treated, but jaw pain remains prevalent -- to left side.  Is a current smoker, smokes 1 PPD -- would like to stop, has tried patches and Chantix (this made her have crazy thoughts), Wellbutrin (worked okay, but still had urges).  Mother was just diagnosed with RA and she is concerned about this. ° °Pain to right knee and foot present 3 weeks ago, using stairs at her house -- this is getting worse.  Has underlying back pain which is ongoing issue -- having reoccurring BV and UTI being followed by GYN.  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  Past imaging of lumbar spine did noted arthritic changes on 08/14/21.  °Duration: weeks °Pain: yes °Symmetric: no --  jaw 6/10, eating makes worse == knee/foot 8/10 at worst, back 8/10 at worst °Quality: sharp, dull, aching,  and throbbing °Frequency: intermittent °Context:  fluctuating °Decreased function/range of motion: none, chewing is decreased °Erythema: none °Swelling: none °Heat or warmth: none °Morning stiffness: in the knee °Aggravating factors: chewing for jaw, knee walking, back bending °Alleviating factors: Flexeril helps jaw, does not help knee °Relief with NSAIDs?: No NSAIDs Taken °Treatments attempted:  heat  °Involved Joints:  °   Hands: get stiff a lot °   Wrists: get stiff a lot == thinks she has carpal tunnel °   Elbows: none °   Shoulders: none °   Back: yes  °   Hips: yes bilateral °   Knees: yes right °   Ankles: no  none °   Feet: yes right  ° °Relevant past medical, surgical, family and social history reviewed and updated as indicated. Interim medical history since our last visit reviewed. °Allergies and medications reviewed and updated. ° °Review of Systems  °Constitutional:  Negative for activity change, appetite change, diaphoresis, fatigue and fever.  °Respiratory:  Negative for cough, chest tightness and shortness of breath.   °Cardiovascular:  Negative for chest pain, palpitations and leg swelling.  °Gastrointestinal: Negative.   °Musculoskeletal:  Positive for arthralgias.  °Neurological:   Negative.   °Psychiatric/Behavioral: Negative.    ° °Per HPI unless specifically indicated above ° °   °Objective:  °  °BP 108/73    Pulse 99    Temp 99 °F (37.2 °C) (Oral)    Wt 262 lb 3.2 oz (118.9 kg)    SpO2 96%    BMI 42.32 kg/m²   °Wt Readings from Last 3 Encounters:  °02/11/22 262 lb 3.2 oz (118.9 kg)  °08/14/21 262 lb 3.2 oz (118.9 kg)  °04/08/21 263 lb 4 oz (119.4 kg)  °  °Physical Exam °Vitals and nursing note reviewed.  °Constitutional:   °   General: She is awake. She is not in acute distress. °   Appearance: She is well-developed and well-groomed. She is obese. She is not ill-appearing or toxic-appearing.  °HENT:  °   Head: Normocephalic.  °   Jaw: Tenderness (to left) and pain on movement (to left) present.  No swelling.  °   Comments: Left jaw popping with movement. °   Right Ear: Hearing normal.  °   Left Ear: Hearing normal.  °Eyes:  °   General: Lids are normal.     °   Right eye: No discharge.     °   Left eye: No discharge.  °   Conjunctiva/sclera: Conjunctivae normal.  °   Pupils: Pupils are equal, round, and reactive to light.  °Neck:  °   Thyroid: No thyromegaly.  °   Vascular: No carotid bruit.  °Cardiovascular:  °   Rate and Rhythm: Normal rate and regular rhythm.  °   Heart sounds: Normal heart sounds. No murmur heard. °  No gallop.  °Pulmonary:  °   Effort: Pulmonary effort is normal. No accessory muscle usage or respiratory distress.  °   Breath sounds: Normal breath sounds.  °Abdominal:  °   General: Bowel sounds are normal.  °   Palpations: Abdomen is soft.  °Musculoskeletal:  °   Cervical back: Normal range of motion and neck supple.  °   Lumbar back: Tenderness present. No swelling, edema, spasms or bony tenderness. Normal range of motion. Negative right straight leg raise test and negative left straight leg raise test.  °   Right knee: Crepitus present. No swelling, erythema, ecchymosis or bony tenderness. Normal range of motion. Tenderness present over the patellar tendon.  °   Instability Tests: Anterior drawer test negative. Posterior drawer test negative. Anterior Lachman test negative. Medial McMurray test negative and lateral McMurray test negative.  °   Left knee: Normal.  °   Right lower leg: No edema.  °   Left lower leg: No edema.  °Lymphadenopathy:  °   Cervical: No cervical adenopathy.  °Skin: °   General: Skin is warm and dry.  °Neurological:  °   Mental Status: She is alert and oriented to person, place, and time.  °Psychiatric:     °   Attention and Perception: Attention normal.     °   Mood and Affect: Mood normal.     °   Speech: Speech normal.     °   Behavior: Behavior normal. Behavior is cooperative.     °   Thought Content: Thought content normal.  ° ° °Results for orders placed  or performed in visit on 12/31/21  °Rapid Strep screen([facility])  ° Specimen: Other  ° Other  °Result Value Ref Range  ° Strep Gp A Ag, IA W/Reflex Negative Negative  °Novel Coronavirus,   NAA ([facility])  ° Specimen: Nasopharyngeal(NP) swabs in vial transport medium  °Result Value Ref Range  ° SARS-CoV-2, NAA Not Detected Not Detected  °Culture, Group A Strep  ° Other  °Result Value Ref Range  ° Strep A Culture Comment (A)   °SARS-COV-2, NAA 2 DAY TAT  °Result Value Ref Range  ° SARS-CoV-2, NAA 2 DAY TAT Performed   °Veritor Flu A/B Waived  °Result Value Ref Range  ° Influenza A Negative Negative  ° Influenza B Negative Negative  ° °   °Assessment & Plan:  ° °Problem List Items Addressed This Visit   ° °  ° Musculoskeletal and Integument  ° Arthralgia of left temporomandibular joint  °  To left jaw -- 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  At this time will get her into PT for this and consider imaging via CT in future if ongoing.  Recommend alternate ice and heat at home + take Ibuprofen or Tylenol as needed.  May also rub Voltaren gel onto jaw line and massage for comfort.  Avoid chewing gum or harder foods until symptoms improved.   °  °  ° Relevant Orders  ° Ambulatory referral to Physical Therapy  °  ° Other  ° Multiple joint pain  °  To right knee & foot, lower back, and jaw.  Would benefit from modest weight loss, which was discussed.  At this time will obtain imaging of right knee and foot.  Recommend Tylenol as needed for pain + may use OTC creams like Icy/Hot and Voltaren.  Will obtain labs today due to mother with RA and her concerns about this + joint pain being present: CBC, CMP, TSH, ANA, CRP, ESR, B12, iron and ferritin.  Could consider tick borne blood work in future if ongoing and current labs reassuring.  Continue simple treatment and consider PT in future. °  °  ° Relevant Orders  ° ANA w/Reflex if Positive  ° C-reactive protein  ° Sed Rate (ESR)  ° CBC with Differential/Platelet  ° Comprehensive  metabolic panel  ° TSH  ° VITAMIN D 25 Hydroxy (Vit-D Deficiency, Fractures)  ° Vitamin B12  ° DG Knee Complete 4 Views Right  ° DG Foot Complete Right  ° Iron Binding Cap (TIBC)([facility])  ° Ferritin  ° Obesity - Primary  °  BMI 42.32.  Recommended eating smaller high protein, low fat meals more frequently and exercising 30 mins a day 5 times a week with a goal of 10-15lb weight loss in the next 3 months. Patient voiced their understanding and motivation to adhere to these recommendations. ° °  °  °  °Time: 25 minutes, >50% spent counseling/or care coordination ° ° °Follow up plan: °Return in about 4 weeks (around 03/11/2022) for TMJ + back and right foot pain. ° ° ° ° ° °

## 2022-02-12 ENCOUNTER — Encounter: Payer: Self-pay | Admitting: Nurse Practitioner

## 2022-02-12 ENCOUNTER — Telehealth: Payer: Self-pay | Admitting: Nurse Practitioner

## 2022-02-12 DIAGNOSIS — E559 Vitamin D deficiency, unspecified: Secondary | ICD-10-CM | POA: Insufficient documentation

## 2022-02-12 LAB — IRON AND TIBC
Iron Saturation: 17 % (ref 15–55)
Iron: 50 ug/dL (ref 27–159)
Total Iron Binding Capacity: 297 ug/dL (ref 250–450)
UIBC: 247 ug/dL (ref 131–425)

## 2022-02-12 LAB — COMPREHENSIVE METABOLIC PANEL
ALT: 11 IU/L (ref 0–32)
AST: 11 IU/L (ref 0–40)
Albumin/Globulin Ratio: 1.5 (ref 1.2–2.2)
Albumin: 4.4 g/dL (ref 3.8–4.8)
Alkaline Phosphatase: 93 IU/L (ref 44–121)
BUN/Creatinine Ratio: 12 (ref 9–23)
BUN: 9 mg/dL (ref 6–20)
Bilirubin Total: 0.2 mg/dL (ref 0.0–1.2)
CO2: 18 mmol/L — ABNORMAL LOW (ref 20–29)
Calcium: 9.2 mg/dL (ref 8.7–10.2)
Chloride: 104 mmol/L (ref 96–106)
Creatinine, Ser: 0.77 mg/dL (ref 0.57–1.00)
Globulin, Total: 2.9 g/dL (ref 1.5–4.5)
Glucose: 105 mg/dL — ABNORMAL HIGH (ref 70–99)
Potassium: 4 mmol/L (ref 3.5–5.2)
Sodium: 138 mmol/L (ref 134–144)
Total Protein: 7.3 g/dL (ref 6.0–8.5)
eGFR: 105 mL/min/{1.73_m2} (ref >59)

## 2022-02-12 LAB — VITAMIN B12: Vitamin B-12: 285 pg/mL (ref 232–1245)

## 2022-02-12 LAB — CBC WITH DIFFERENTIAL/PLATELET
Basophils Absolute: 0.1 10*3/uL (ref 0.0–0.2)
Basos: 1 %
EOS (ABSOLUTE): 0.5 10*3/uL — ABNORMAL HIGH (ref 0.0–0.4)
Eos: 4 %
Hematocrit: 43.3 % (ref 34.0–46.6)
Hemoglobin: 14.6 g/dL (ref 11.1–15.9)
Immature Grans (Abs): 0 10*3/uL (ref 0.0–0.1)
Immature Granulocytes: 0 %
Lymphocytes Absolute: 2.7 10*3/uL (ref 0.7–3.1)
Lymphs: 23 %
MCH: 28.6 pg (ref 26.6–33.0)
MCHC: 33.7 g/dL (ref 31.5–35.7)
MCV: 85 fL (ref 79–97)
Monocytes Absolute: 0.6 10*3/uL (ref 0.1–0.9)
Monocytes: 6 %
Neutrophils Absolute: 7.7 10*3/uL — ABNORMAL HIGH (ref 1.4–7.0)
Neutrophils: 66 %
Platelets: 331 10*3/uL (ref 150–450)
RBC: 5.11 x10E6/uL (ref 3.77–5.28)
RDW: 13.1 % (ref 11.7–15.4)
WBC: 11.7 10*3/uL — ABNORMAL HIGH (ref 3.4–10.8)

## 2022-02-12 LAB — VITAMIN D 25 HYDROXY (VIT D DEFICIENCY, FRACTURES): Vit D, 25-Hydroxy: 13 ng/mL — ABNORMAL LOW (ref 30.0–100.0)

## 2022-02-12 LAB — FERRITIN: Ferritin: 164 ng/mL — ABNORMAL HIGH (ref 15–150)

## 2022-02-12 LAB — C-REACTIVE PROTEIN: CRP: 13 mg/L — ABNORMAL HIGH (ref 0–10)

## 2022-02-12 LAB — SEDIMENTATION RATE: Sed Rate: 68 mm/hr — ABNORMAL HIGH (ref 0–32)

## 2022-02-12 LAB — TSH: TSH: 1.66 u[IU]/mL (ref 0.450–4.500)

## 2022-02-12 LAB — ANA W/REFLEX IF POSITIVE: Anti Nuclear Antibody (ANA): NEGATIVE

## 2022-02-12 MED ORDER — VITAMIN D (ERGOCALCIFEROL) 50000 UNITS PO CAPS: 1.0000 | ORAL_CAPSULE | ORAL | 4 refills | Status: DC

## 2022-02-12 NOTE — Addendum Note (Signed)
Addended by: Marnee Guarneri T on: 02/12/2022 05:50 PM ? ? Modules accepted: Orders ? ?

## 2022-02-12 NOTE — Progress Notes (Signed)
Contacted via [place] ? ?Good evening Samantha Villegas, your labs have returned, lots here to explain bear with me: ?- Inflammatory markers, ESR and CRP, are mildly elevated, but ANA (looking for rheumatoid or other arthritic issues) is negative.  The elevation in inflammatory markers could be related to many things and I recommend recheck of these next visit. ?- CBC shows mild elevation of white blood cell count and neutrophils, have you been sick recently? If so this could also cause elevation in ESR and CRP, will recheck next visit. ?- Kidney function, creatinine and eGFR, remains normal, as is liver function, AST and ALT.  ?- Vitamin D level very low, I am going to send in a higher dose Vitamin D3 supplement for you to start taking weekly to help with bone health. ?- Thyroid level normal - TSH. ?- B12 level is on lower side, we like this above 300, I recommend you start taking Vitamin B12 1000 MCG daily, which you can obtain over the counter.   ?- Iron level is normal, lower end -- a multivitamin daily would be good.  Any questions? ?Keep being stellar!!  Thank you for allowing me to participate in your care.  I appreciate you. ?Kindest regards, ?Zarina Pe

## 2022-02-12 NOTE — Progress Notes (Signed)
Hi Marlissa.  Your xray shows that you have a mild hallus valgus which is a bunion.  If you are continuing to have pain I recommend you see a podiatrist.  Please let me know if you are okay with that and I will place the referral.

## 2022-02-12 NOTE — Progress Notes (Signed)
Contacted via MyChart ? ? 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  This may be related though to findings on foot imaging which noted mild hallux valgus -- this means your great toe has a mild slant outwards and could be causing some discomfort.  I would recommend referral to podiatry for further evaluation of this.  Would this be okay with you?  Let me know. ?Keep being stellar!!  Thank you for allowing me to participate in your care.  I appreciate you. ?Kindest regards, ?Adonai Helzer ?

## 2022-02-12 NOTE — Telephone Encounter (Signed)
Copied from CRM [id number]. Topic: General - Other ?>> Feb 12, 2022 11:34 AM Samantha Villegas, Samantha Villegas wrote: ?Reason for CRM: Pt called in stating she saw her latest lab results but wanted Villegas call to go over the numbers and what they mean, please advise. ?

## 2022-02-12 NOTE — Addendum Note (Signed)
Addended by: Aura Dials T on: 02/12/2022 05:24 PM ? ? Modules accepted: Orders ? ?

## 2022-02-12 NOTE — Telephone Encounter (Signed)
Will reach out to her via MyChart ?

## 2022-02-19 ENCOUNTER — Encounter: Payer: Self-pay | Admitting: Podiatry

## 2022-02-19 ENCOUNTER — Ambulatory Visit: Payer: Medicaid Other | Admitting: Podiatry

## 2022-02-19 ENCOUNTER — Other Ambulatory Visit: Payer: Self-pay

## 2022-02-19 DIAGNOSIS — M25371 Other instability, right ankle: Secondary | ICD-10-CM | POA: Diagnosis not present

## 2022-02-19 DIAGNOSIS — S93491A Sprain of other ligament of right ankle, initial encounter: Secondary | ICD-10-CM | POA: Diagnosis not present

## 2022-02-19 NOTE — Progress Notes (Signed)
?Subjective:  ?Patient ID: Samantha Villegas, female    DOB: Sep 02, 1991,  MRN: [id number] ? ?Chief Complaint  ?Patient presents with  ? Foot Pain  ?  "I have pain around my ankle and I  have knots that develop sometime."  ? ? ?31 y.o. female presents with the above complaint.  Patient presents with complaint of right ankle pain.  She states that it has been on for quite some time is progressive gotten worse.  She has not seen anyone else prior to seeing me.  She states this hurts with ambulation she went to urgent care where they did an x-ray which showed that she may also have a bunion mild.  No fractures or bony concerns noted.  She wanted to get it evaluated she has not seen and was prior to seeing me pain scale is 5 out of 10 hurts with palpation and ambulation.  She is flat-footed.  She wears [employer] shoes ? ? ?Review of Systems: Negative except as noted in the HPI. Denies N/V/F/Ch. ? ?Past Medical History:  ?Diagnosis Date  ? Anxiety   ? no meds  ? Asthma   ? h/o no inhalers  ? GERD (gastroesophageal reflux disease)   ? no meds  ? History of itching   ? Pyelonephritis   ? Skipped heart beats   ? pt states this happens occ but is asymptomatic  ? ? ?Current Outpatient Medications:  ?  acetaminophen (TYLENOL) 500 MG tablet, Take 1,000 mg by mouth every 6 (six) hours as needed (for pain.)., Disp: , Rfl:  ?  albuterol (PROAIR HFA) 108 (90 Base) MCG/ACT inhaler, Inhale 2 puffs into the lungs every 6 (six) hours as needed for wheezing or shortness of breath., Disp: 18 g, Rfl: 3 ?  buPROPion (WELLBUTRIN XL) 150 MG 24 hr tablet, Start out with 150 MG (one tablet) once daily by mouth for 3 days then increase to 150 MG (one tablet) twice daily by mouth for smoking cessation., Disp: 180 tablet, Rfl: 1 ?  cyclobenzaprine (FLEXERIL) 10 MG tablet, Take 1 tablet (10 mg total) by mouth 3 (three) times daily as needed for muscle spasms., Disp: 60 tablet, Rfl: 1 ?  fluticasone (FLONASE) 50 MCG/ACT nasal spray, Place 2 sprays  into both nostrils 2 (two) times daily., Disp: 16 g, Rfl: 11 ?  ibuprofen (ADVIL) 600 MG tablet, TAKE 1 TABLET (600 MG TOTAL) BY MOUTH EVERY SIX (6) HOURS., Disp: , Rfl:  ?  loratadine (CLARITIN) 10 MG tablet, Take 1 tablet (10 mg total) by mouth daily., Disp: 30 tablet, Rfl: 11 ?  medroxyPROGESTERone (DEPO-PROVERA) 150 MG/ML injection, Inject 150 mg into the muscle every 3 (three) months., Disp: , Rfl:  ?  metroNIDAZOLE (METROGEL VAGINAL) 0.75 % vaginal gel, Place 1 Applicatorful vaginally 2 (two) times daily., Disp: 70 g, Rfl: 0 ?  predniSONE (DELTASONE) 10 MG tablet, Take 1 tablet (10 mg total) by mouth daily with breakfast. Take 6 today, 5 tomorrow and decrease by 1 each day until course is complete., Disp: 21 tablet, Rfl: 0 ?  Vitamin D, Ergocalciferol, 50000 units CAPS, Take 1 capsule by mouth once a week., Disp: 12 capsule, Rfl: 4 ? ?Social History  ? ?Tobacco Use  ?Smoking Status Former  ? Packs/day: 1.00  ? Years: 10.00  ? Pack years: 10.00  ? Types: Cigarettes  ? Quit date: 08/08/2018  ? Years since quitting: 3.5  ?Smokeless Tobacco Never  ? ? ?Allergies  ?Allergen Reactions  ? Latex Other (See Comments)  ?  With latex condoms--uti/bacterial infections.  ? ?Objective:  ?There were no vitals filed for this visit. ?There is no height or weight on file to calculate BMI. ?Constitutional Well developed. ?Well nourished.  ?Vascular Dorsalis pedis pulses palpable bilaterally. ?Posterior tibial pulses palpable bilaterally. ?Capillary refill normal to all digits.  ?No cyanosis or clubbing noted. ?Pedal hair growth normal.  ?Neurologic Normal speech. ?Oriented to person, place, and time. ?Epicritic sensation to light touch grossly present bilaterally.  ?Dermatologic Nails well groomed and normal in appearance. ?No open wounds. ?No skin lesions.  ?Orthopedic: Pain on palpation to the ATFL ligament.  No pain at the peroneal tendon, posterior tibial tendon, Achilles tendon.  Pain with dorsiflexion eversion of the foot.   No pain with plantarflexion inversion of the foot.  ? ?Radiographs: 3 views of skeletally mature adult right foot Mild hallux valgus. Joint spaces are preserved. No acute fracture or ?dislocation.  No fractures noted. ?  ?Assessment:  ? ?1. Ankle instability, right   ?2. Sprain of anterior talofibular ligament of right ankle, initial encounter   ? ?Plan:  ?Patient was evaluated and treated and all questions answered. ? ?Right ankle ATFL ligament sprain/chronic ankle instability ?-I explained the patient the etiology of sprain and various treatment options were discussed.  She does have a history of ankle sprain many years ago.  This could relate to chronic ankle instability and the pain that she is experiencing now.  I believe she will benefit from cam boot immobilization.  If there is no improvement we will discuss steroid injection versus MRI during next clinical visit.  She states understanding. ? ?No follow-ups on file.  ?

## 2022-02-24 ENCOUNTER — Other Ambulatory Visit: Payer: Self-pay

## 2022-02-24 ENCOUNTER — Encounter: Payer: Self-pay | Admitting: Unknown Physician Specialty

## 2022-02-24 ENCOUNTER — Ambulatory Visit (INDEPENDENT_AMBULATORY_CARE_PROVIDER_SITE_OTHER): Payer: Medicaid Other | Admitting: Unknown Physician Specialty

## 2022-02-24 VITALS — BP 110/76 | HR 99 | Temp 98.2°F | Wt 259.8 lb

## 2022-02-24 DIAGNOSIS — J029 Acute pharyngitis, unspecified: Secondary | ICD-10-CM | POA: Diagnosis not present

## 2022-02-24 DIAGNOSIS — R509 Fever, unspecified: Secondary | ICD-10-CM | POA: Diagnosis not present

## 2022-02-24 DIAGNOSIS — M545 Low back pain, unspecified: Secondary | ICD-10-CM

## 2022-02-24 DIAGNOSIS — G8929 Other chronic pain: Secondary | ICD-10-CM

## 2022-02-24 LAB — MICROSCOPIC EXAMINATION

## 2022-02-24 LAB — URINALYSIS, ROUTINE W REFLEX MICROSCOPIC
Bilirubin, UA: NEGATIVE
Glucose, UA: NEGATIVE
Ketones, UA: NEGATIVE
Nitrite, UA: NEGATIVE
Protein,UA: NEGATIVE
Specific Gravity, UA: 1.025 (ref 1.005–1.030)
Urobilinogen, Ur: 0.2 mg/dL (ref 0.2–1.0)
pH, UA: 6 (ref 5.0–7.5)

## 2022-02-24 NOTE — Progress Notes (Signed)
? ?BP 110/76   Pulse 99   Temp 98.2 ?F (36.8 ?C) (Oral)   Wt 259 lb 12.8 oz (117.8 kg)   SpO2 98%   BMI 41.93 kg/m?   ? ?Subjective:  ? ? Patient ID: Samantha Villegas, female    DOB: [birth year], 31 y.o.   MRN: [id number] ? ?HPI: ?Samantha Villegas is a 31 y.o. female ? ?Chief Complaint  ?Patient presents with  ? Sore Throat  ? Chills  ? Fever  ? Generalized Body Aches  ?  Patient states she became symptomatic about 2 days ago and states she took an at-home COVID test and it was negative. Patient states her daughter came home Thursday from school sick and then her son has been sick as well.   ? Back Pain  ?  Patent is asking if she could have her urine tested for BV as she has been having lower abdominal pain and back pain. Patient states she first noticed it about 5 days ago.   ? ?Sore Throat  ?This is a new problem. Episode onset: 2 days. The problem has been gradually worsening. The maximum temperature recorded prior to her arrival was 102 - 102.9 F. Associated symptoms include congestion, headaches and a hoarse voice. Pertinent negatives include no abdominal pain, coughing, diarrhea, ear pain, neck pain, shortness of breath, swollen glands or trouble swallowing. She has tried nothing for the symptoms.  ?Fever  ?Associated symptoms include congestion and headaches. Pertinent negatives include no abdominal pain, coughing, diarrhea or ear pain.  ?Back Pain ?This is a chronic (lower back.  States she has "pelvic arthritis" and has intermittent) problem. The problem occurs constantly. Associated symptoms include a fever and headaches. Pertinent negatives include no abdominal pain.  Pt would like for urine to be checked ? ? ?Relevant past medical, surgical, family and social history reviewed and updated as indicated. Interim medical history since our last visit reviewed. ?Allergies and medications reviewed and updated. ? ?Review of Systems  ?Constitutional:  Positive for fever.  ?HENT:  Positive for  congestion and hoarse voice. Negative for ear pain and trouble swallowing.   ?Respiratory:  Negative for cough and shortness of breath.   ?Gastrointestinal:  Negative for abdominal pain and diarrhea.  ?Musculoskeletal:  Positive for back pain. Negative for neck pain.  ?Neurological:  Positive for headaches.  ? ?Per HPI unless specifically indicated above ? ?   ?Objective:  ?  ?BP 110/76   Pulse 99   Temp 98.2 ?F (36.8 ?C) (Oral)   Wt 259 lb 12.8 oz (117.8 kg)   SpO2 98%   BMI 41.93 kg/m?   ?Wt Readings from Last 3 Encounters:  ?02/24/22 259 lb 12.8 oz (117.8 kg)  ?02/11/22 262 lb 3.2 oz (118.9 kg)  ?08/14/21 262 lb 3.2 oz (118.9 kg)  ?  ?Physical Exam ? ?Results for orders placed or performed in visit on 02/11/22  ?ANA w/Reflex if Positive  ?Result Value Ref Range  ? Anti Nuclear Antibody (ANA) Negative Negative  ?C-reactive protein  ?Result Value Ref Range  ? CRP 13 (H) 0 - 10 mg/L  ?Sed Rate (ESR)  ?Result Value Ref Range  ? Sed Rate 68 (H) 0 - 32 mm/hr  ?CBC with Differential/Platelet  ?Result Value Ref Range  ? WBC 11.7 (H) 3.4 - 10.8 x10E3/uL  ? RBC 5.11 3.77 - 5.28 x10E6/uL  ? Hemoglobin 14.6 11.1 - 15.9 g/dL  ? Hematocrit 43.3 34.0 - 46.6 %  ? MCV 85 79 -  97 fL  ? MCH 28.6 26.6 - 33.0 pg  ? MCHC 33.7 31.5 - 35.7 g/dL  ? RDW 13.1 11.7 - 15.4 %  ? Platelets 331 150 - 450 x10E3/uL  ? Neutrophils 66 Not Estab. %  ? Lymphs 23 Not Estab. %  ? Monocytes 6 Not Estab. %  ? Eos 4 Not Estab. %  ? Basos 1 Not Estab. %  ? Neutrophils Absolute 7.7 (H) 1.4 - 7.0 x10E3/uL  ? Lymphocytes Absolute 2.7 0.7 - 3.1 x10E3/uL  ? Monocytes Absolute 0.6 0.1 - 0.9 x10E3/uL  ? EOS (ABSOLUTE) 0.5 (H) 0.0 - 0.4 x10E3/uL  ? Basophils Absolute 0.1 0.0 - 0.2 x10E3/uL  ? Immature Granulocytes 0 Not Estab. %  ? Immature Grans (Abs) 0.0 0.0 - 0.1 x10E3/uL  ?Comprehensive metabolic panel  ?Result Value Ref Range  ? Glucose 105 (H) 70 - 99 mg/dL  ? BUN 9 6 - 20 mg/dL  ? Creatinine, Ser 0.77 0.57 - 1.00 mg/dL  ? eGFR 105 >59 mL/min/1.73  ?  BUN/Creatinine Ratio 12 9 - 23  ? Sodium 138 134 - 144 mmol/L  ? Potassium 4.0 3.5 - 5.2 mmol/L  ? Chloride 104 96 - 106 mmol/L  ? CO2 18 (L) 20 - 29 mmol/L  ? Calcium 9.2 8.7 - 10.2 mg/dL  ? Total Protein 7.3 6.0 - 8.5 g/dL  ? Albumin 4.4 3.8 - 4.8 g/dL  ? Globulin, Total 2.9 1.5 - 4.5 g/dL  ? Albumin/Globulin Ratio 1.5 1.2 - 2.2  ? Bilirubin Total 0.2 0.0 - 1.2 mg/dL  ? Alkaline Phosphatase 93 44 - 121 IU/L  ? AST 11 0 - 40 IU/L  ? ALT 11 0 - 32 IU/L  ?TSH  ?Result Value Ref Range  ? TSH 1.660 0.450 - 4.500 uIU/mL  ?VITAMIN D 25 Hydroxy (Vit-D Deficiency, Fractures)  ?Result Value Ref Range  ? Vit D, 25-Hydroxy 13.0 (L) 30.0 - 100.0 ng/mL  ?Vitamin B12  ?Result Value Ref Range  ? Vitamin B-12 285 232 - 1,245 pg/mL  ?Iron Binding Cap (TIBC)([facility])  ?Result Value Ref Range  ? Total Iron Binding Capacity 297 250 - 450 ug/dL  ? UIBC 247 131 - 425 ug/dL  ? Iron 50 27 - 159 ug/dL  ? Iron Saturation 17 15 - 55 %  ?Ferritin  ?Result Value Ref Range  ? Ferritin 164 (H) 15 - 150 ng/mL  ? ?   ?Assessment & Plan:  ? ?Problem List Items Addressed This Visit   ?None ?Visit Diagnoses   ? ? Chronic low back pain without sciatica, unspecified back pain laterality    -  Primary  ? Check a urine.  discussed that BV is unlikely the cause of her back pain.  No vaginal symptoms and will defer BV testing  ? Relevant Orders  ? Urinalysis, Routine w reflex microscopic  ? Pharyngitis, unspecified etiology      ? Relevant Orders  ? Rapid Strep screen([facility])  ? Fever, unspecified fever cause      ? Check strep, flu, and Covid.  Discussed this is likely viral considering other ill family members.  Supportive care  ? Relevant Orders  ? Novel Coronavirus, NAA ([facility])  ? Influenza A & B (STAT)  ? Rapid Strep screen([facility])  ? ?  ?  ? ?Follow up plan: ?Return if symptoms worsen or fail to improve. ? ? ? ? ? ?

## 2022-02-25 LAB — NOVEL CORONAVIRUS, NAA: SARS-CoV-2, NAA: NOT DETECTED

## 2022-02-28 LAB — VERITOR FLU A/B WAIVED
Influenza A: NEGATIVE
Influenza B: NEGATIVE

## 2022-02-28 LAB — RAPID STREP SCREEN (MED CTR MEBANE ONLY): Strep Gp A Ag, IA W/Reflex: NEGATIVE

## 2022-02-28 LAB — CULTURE, GROUP A STREP: Strep A Culture: NEGATIVE

## 2022-03-04 ENCOUNTER — Ambulatory Visit: Payer: Medicaid Other | Attending: Nurse Practitioner

## 2022-03-04 DIAGNOSIS — M26622 Arthralgia of left temporomandibular joint: Secondary | ICD-10-CM | POA: Insufficient documentation

## 2022-03-04 DIAGNOSIS — M2669 Other specified disorders of temporomandibular joint: Secondary | ICD-10-CM | POA: Insufficient documentation

## 2022-03-04 NOTE — Therapy (Addendum)
[facility] ?[address] ?[place], [place], [address] ?Phone: [phone]   Fax:  [phone] ? ?Physical Therapy Evaluation ? ?Patient Details  ?Name: Samantha Villegas ?MRN: [id number] ?Date of Birth: January 10, 1991 ?Referring Provider (PT): Venita Lick, NP ? ? ?Encounter Date: 03/04/2022 ? ? PT End of Session - 03/04/22 1019   ? ? Visit Number 1   ? Number of Visits 17   ? Date for PT Re-Evaluation 04/30/22   ? PT Start Time 1019   ? PT Stop Time 1101   ? PT Time Calculation (min) 42 min   ? Activity Tolerance Patient tolerated treatment well   ? Behavior During Therapy [facility] for tasks assessed/performed   ? ?  ?  ? ?  ? ? ?Past Medical History:  ?Diagnosis Date  ? Anxiety   ? no meds  ? Asthma   ? h/o no inhalers  ? GERD (gastroesophageal reflux disease)   ? no meds  ? History of itching   ? Pyelonephritis   ? Skipped heart beats   ? pt states this happens occ but is asymptomatic  ? ? ?Past Surgical History:  ?Procedure Laterality Date  ? CESAREAN SECTION    ? LAPAROSCOPIC OVARIAN CYSTECTOMY Right 05/26/2018  ? Procedure: LAPAROSCOPIC RIGHT OVARIAN CYSTECTOMY;  Surgeon: Homero Fellers, MD;  Location: [facility] ORS;  Service: Gynecology;  Laterality: Right;  ? ? ?There were no vitals filed for this visit. ? ? ? Subjective Assessment - 03/04/22 1024   ? ? Subjective L jaw: 0/10 currently, 10/10 at worst for the past 3 months (could not chew it hurt so bad).   ? 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 First noticed it when she was 31 years old. Pt yawend and jaw dislocated. Had a flare up recently about 2-3 weeks ago which is affecting her ear cavity when she chews. Muscle relaxers helped and its been a lot better than it was. Has been eating soft foods recently. It has been a year since her jaw popped or dislocated. No neck pain.   ? Patient Stated Goals Be able to chew and yawn comfortably.   ? Currently  in Pain? No/denies   ? Pain Location Jaw   ? Pain Orientation Left   ? Pain Descriptors / Indicators Aching;Tender   ? Pain Type Chronic pain   ? Pain Onset More than a month ago   ? Pain Frequency Occasional   ? Aggravating Factors  Chewing, yawing   ? Pain Relieving Factors Muscle relaxers   ? ?  ?  ? ?  ? ? ? ? ? [facility] PT Assessment - 03/04/22 1023   ? ?  ? Assessment  ? Medical Diagnosis M26.622 (ICD-10-CM) - Arthralgia of left temporomandibular joint   ? Referring Provider (PT) Venita Lick, NP   ? Onset Date/Surgical Date 02/11/22   Date PT referral signed  ? Hand Dominance Right   ? Prior Therapy None   ?  ? Precautions  ? Precaution Comments No known precautions   ?  ? Restrictions  ? Other Position/Activity Restrictions No known restrictions   ?  ? Balance Screen  ? Has the patient fallen in the past 6 months No   ? Has the patient had a decrease in activity level because of a fear of falling?  No   ? Is the patient reluctant to leave their  home because of a fear of falling?  No   ?  ? Home Environment  ? Additional Comments Pt lives in a 2 story home with parents and 2 kids. 12 steps to get to 2nd floor L rail. No steps to enter.   ?  ? Observation/Other Assessments  ? Observations R jaw deviation when opening mouth and at rest. "S" shaped movement pattern when opening and closing mouth.   ? Focus on Therapeutic Outcomes (FOTO)  Orofacial FOTO 47   ?  ? Posture/Postural Control  ? Posture Comments slight L cervical lateral deviation, forward neck, B scapular protraction, movement crease around C5/C6 area, L shoulder lower, slight L lateral shift (trunk), slight L trunk rotation,   ?  ? AROM  ? Cervical Flexion full   ? Cervical Extension full   ? Cervical - Right Side [facility]   ? Cervical - Left Side [facility]   ? Cervical - Right Rotation full   ? Cervical - Left Rotation full   ?  ? Strength  ? Overall Strength Comments Manually resisted scapular retraction targeting lower trap: 4-/5 R, 4-/5 L   ?  ?  Palpation  ? Palpation comment increased L masseter muscle tension, R upper trap muscle tension, slight B cervical paraspinal muscle tension.   ? ?  ?  ? ?  ? ? ? ? ? ? ? ? ? ? ? ? ? ?Objective measurements completed on examination: See above findings.  ? ? ? ? ? ? ? ? ?Latex allergies ? ?Observation ?R jaw deviation.  ? ? ?LTG: decrease pain, improve scapular strength, FOTO, be able to yawn and chew without pain normally.  ? ?Medbridge Access Code [email or web address] ? ? ? ?Therapeutic exercise ? ?Seated manually resisted L lateral shift isometrics at jaw to promote more neutral posture 10x5 seconds (to decrease L lateral pterygoid muscle tension) ? ?Seated chin tucks 10x5 seconds ? ? ?Improved exercise technique, movement at target joints, use of target muscles after mod verbal, visual, tactile cues.  ? ? ? ?Response to treatment ?Pt tolerated session well without aggravation of symptoms.  ? ? ?Clinical impression ?Pt is a 31 year old female who came to physical therapy secondary to L temporomandibular joint pain. She also presents with poor posture, B scapular weakness, R jaw deviation with opening and closing her mouth, (suggesting over activation of L lateral pterygoid muscle), B cervical paraspinal, L masseter, and R upper trap muscle tension, and difficulty chewing and yawning secondary to pain. Pt will benefit from skilled physical therapy services to address the aforementioned deficits.  ? ? ? ? ? ? ? ?Access Code: [email or web address] ?URL: [email or web address] ?Date: 03/04/2022 ?Prepared by: Loralyn Freshwater ? ?Exercises ?- Isometric Jaw Deviation  - 3 x daily - 7 x weekly - 3 sets - 10 reps - 5 seconds hold ?- Seated Cervical Retraction  - 3 x daily - 7 x weekly - 3 sets - 10 reps - 5 seconds hold ? ? ? ? ? ? ? ? ? ? ? ? ? ? ? ? ? ? ? PT Education - 03/04/22 1830   ? ? Education Details Ther-ex, HEP, POC   ? Person(s) Educated Patient   ? Methods Explanation;Demonstration;Tactile cues;Verbal cues;Handout   ?  Comprehension Verbalized understanding;Returned demonstration   ? ?  ?  ? ?  ? ? ? PT Short Term Goals - 03/04/22 1417   ? ?  ? PT SHORT TERM GOAL #1  ? Title  Pt will be independent with her initial HEP to decrease pain, improve ability to chew and yawn more comfortably.   ? Baseline Pt has started her HEP (03/04/2022)   ? Time 3   ? Period Weeks   ? Status New   ? Target Date 03/26/22   ? ?  ?  ? ?  ? ? ? ? PT Long Term Goals - 03/04/22 1419   ? ?  ? PT LONG TERM GOAL #1  ? Title Pt will have a decrease in L jaw pain to 4/10 or less at worst to promote ability to chew and yawn more comfortably.   ? Baseline 10/10 L jaw pain at most for the past month. (03/04/2022)   ? Time 8   ? Period Weeks   ? Status New   ? Target Date 04/30/22   ?  ? PT LONG TERM GOAL #2  ? Title Pt will improve B lower trap muscle strength by at least 1/2 MMT grade to promote better posture and foundation for her jaw muscles to function more effectively to decrease pain.   ? Baseline 4-/5 B lower trap strength (03/04/2022)   ? Time 8   ? Period Weeks   ? Status New   ? Target Date 04/30/22   ?  ? PT LONG TERM GOAL #3  ? Title Pt will improve her FOTO score by at least 10 points as a demonstration of improved function.   ? Baseline Orofacial FOTO 47 (03/04/2022)   ? Time 8   ? Period Weeks   ? Status New   ? Target Date 04/30/22   ? ?  ?  ? ?  ? ? ? ? ? ? ? ? ? Plan - 03/04/22 1823   ? ? Clinical Impression Statement Pt is a 31 year old female who came to physical therapy secondary to L temporomandibular joint pain. She also presents with poor posture, B scapular weakness, R jaw deviation with opening and closing her mouth, (suggesting over activation of L lateral pterygoid muscle), B cervical paraspinal, L masseter, and R upper trap muscle tension, and difficulty chewing and yawning secondary to pain. Pt will benefit from skilled physical therapy services to address the aforementioned deficits.   ? Personal Factors and Comorbidities Comorbidity  1;Time since onset of injury/illness/exacerbation   ? Comorbidities Anxiety   ? Examination-Activity Limitations Self Feeding   ? Stability/Clinical Decision Making Stable/Uncomplicated   ? Clinical Decision M

## 2022-03-05 NOTE — Addendum Note (Signed)
Addended by: Charlene Brooke on: 03/05/2022 12:46 PM ? ? Modules accepted: Orders ? ?

## 2022-03-11 NOTE — Progress Notes (Deleted)
? ?  There were no vitals taken for this visit.  ? ?Subjective:  ? ? Patient ID: Samantha Villegas, female    DOB: 01/13/1991, 31 y.o.   MRN: [id number] ? ?HPI: ?Samantha Villegas is a 31 y.o. female ? ?No chief complaint on file. ? ? ?Relevant past medical, surgical, family and social history reviewed and updated as indicated. Interim medical history since our last visit reviewed. ?Allergies and medications reviewed and updated. ? ?Review of Systems ? ?Per HPI unless specifically indicated above ? ?   ?Objective:  ?  ?There were no vitals taken for this visit.  ?Wt Readings from Last 3 Encounters:  ?02/24/22 259 lb 12.8 oz (117.8 kg)  ?02/11/22 262 lb 3.2 oz (118.9 kg)  ?08/14/21 262 lb 3.2 oz (118.9 kg)  ?  ?Physical Exam ? ?Results for orders placed or performed in visit on 02/24/22  ?Novel Coronavirus, NAA ([facility])  ? Specimen: Nasopharyngeal(NP) swabs in vial transport medium  ?Result Value Ref Range  ? SARS-CoV-2, NAA Not Detected Not Detected  ?Rapid Strep screen([facility])  ? Specimen: Other  ? Other  ?Result Value Ref Range  ? Strep Gp A Ag, IA W/Reflex Negative Negative  ?Microscopic Examination  ? Urine  ?Result Value Ref Range  ? WBC, UA 0-5 0 - 5 /hpf  ? RBC 0-2 0 - 2 /hpf  ? Epithelial Cells (non renal) 0-10 0 - 10 /hpf  ? Bacteria, UA Few (A) None seen/Few  ?Culture, Group A Strep  ? Other  ?Result Value Ref Range  ? Strep A Culture Negative   ?Influenza A & B (STAT)  ?Result Value Ref Range  ? Influenza A Negative Negative  ? Influenza B Negative Negative  ?Urinalysis, Routine w reflex microscopic  ?Result Value Ref Range  ? Specific Gravity, UA 1.025 1.005 - 1.030  ? pH, UA 6.0 5.0 - 7.5  ? Color, UA Yellow Yellow  ? Appearance Ur Cloudy (A) Clear  ? Leukocytes,UA Trace (A) Negative  ? Protein,UA Negative Negative/Trace  ? Glucose, UA Negative Negative  ? Ketones, UA Negative Negative  ? RBC, UA Trace (A) Negative  ? Bilirubin, UA Negative Negative  ? Urobilinogen, Ur 0.2 0.2 - 1.0  mg/dL  ? Nitrite, UA Negative Negative  ? Microscopic Examination See below:   ? ?   ?Assessment & Plan:  ? ?Problem List Items Addressed This Visit   ?None ?  ? ?Follow up plan: ?No follow-ups on file. ? ? ? ? ? ?

## 2022-03-12 ENCOUNTER — Ambulatory Visit: Payer: Medicaid Other | Admitting: Nurse Practitioner

## 2022-03-12 ENCOUNTER — Encounter: Payer: Self-pay | Admitting: Nurse Practitioner

## 2022-03-12 VITALS — BP 138/88 | HR 72 | Temp 98.5°F | Wt 260.2 lb

## 2022-03-12 DIAGNOSIS — F419 Anxiety disorder, unspecified: Secondary | ICD-10-CM

## 2022-03-12 DIAGNOSIS — F17210 Nicotine dependence, cigarettes, uncomplicated: Secondary | ICD-10-CM | POA: Diagnosis not present

## 2022-03-12 DIAGNOSIS — M2550 Pain in unspecified joint: Secondary | ICD-10-CM

## 2022-03-12 NOTE — Assessment & Plan Note (Signed)
Has been on Wellbutrin for Smoking Cessation.  Was working well then she had to stop it for her allergy testing this week.  Plans to restart on Monday.  Will follow up in 1 month for reevaluation.  Can increase dose if needed at that time. ?

## 2022-03-12 NOTE — Assessment & Plan Note (Signed)
Controlled when she is on Wellbutrin.  Is restarting medication on Monday. Will reevaluate in 1 month.  ?

## 2022-03-12 NOTE — Assessment & Plan Note (Signed)
Improved since last visit.  ESR and C reactive protein were elevated at last visit. Will recheck labs at follow up to ensure improvement.  Follow up in 1 month for reevaluation. ?

## 2022-03-12 NOTE — Progress Notes (Signed)
? ?BP 138/88   Pulse 72   Temp 98.5 ?F (36.9 ?C) (Oral)   Wt 260 lb 3.2 oz (118 kg)   SpO2 98%   BMI 42.00 kg/m?   ? ?Subjective:  ? ? Patient ID: Samantha Villegas, female    DOB: [birth year], 31 y.o.   MRN: [id number] ? ?HPI: ?Samantha Villegas is a 31 y.o. female ? ?Chief Complaint  ?Patient presents with  ? Temporomandibular Joint Pain  ?  FOLLOW UP   ? Back Pain  ?  FOLLOW UP - STATES FEELING FINE FOR THE MOST PART.   ? ?TMJ ?Patient has improved since she was here last.  Prednisone helped a lot with symptoms.  Denies concerns at visit today.   ? ?BACK PAIN ?Patient states her pain has improved.  She feels like sometimes she has pain on the right lower side.  It occurs when she moves sometimes. Feels like a muscle spasm.   ? ? ?DEPRESSION ?Patient had to stop the Wellbutrin for her allergy testing.  She does have some anxiety and states it was helping with that.  She is restarting it on Monday.   ? ? ?[place] Office Visit from 03/20/2020 in [place]  ?PHQ-9 Total Score 5  ? ?  ? ? ? ? ? ?Relevant past medical, surgical, family and social history reviewed and updated as indicated. Interim medical history since our last visit reviewed. ?Allergies and medications reviewed and updated. ? ?Review of Systems  ?Constitutional:  Negative for activity change, appetite change, diaphoresis, fatigue and fever.  ?Respiratory:  Negative for cough, chest tightness and shortness of breath.   ?Cardiovascular:  Negative for chest pain, palpitations and leg swelling.  ?Gastrointestinal: Negative.   ?Neurological: Negative.   ?Psychiatric/Behavioral:  The patient is nervous/anxious.   ? ?Per HPI unless specifically indicated above ? ?   ?Objective:  ?  ?BP 138/88   Pulse 72   Temp 98.5 ?F (36.9 ?C) (Oral)   Wt 260 lb 3.2 oz (118 kg)   SpO2 98%   BMI 42.00 kg/m?   ?Wt Readings from Last 3 Encounters:  ?03/12/22 260 lb 3.2 oz (118 kg)  ?02/24/22 259 lb 12.8 oz (117.8 kg)  ?02/11/22 262 lb 3.2  oz (118.9 kg)  ?  ?Physical Exam ?Vitals and nursing note reviewed.  ?Constitutional:   ?   General: She is awake. She is not in acute distress. ?   Appearance: She is well-developed and well-groomed. She is obese. She is not ill-appearing or toxic-appearing.  ?HENT:  ?   Head: Normocephalic.  ?   Jaw: No tenderness (to left), swelling or pain on movement (to left).  ?   Comments: Left jaw popping with movement. ?   Right Ear: Hearing normal.  ?   Left Ear: Hearing normal.  ?Eyes:  ?   General: Lids are normal.     ?   Right eye: No discharge.     ?   Left eye: No discharge.  ?   Conjunctiva/sclera: Conjunctivae normal.  ?   Pupils: Pupils are equal, round, and reactive to light.  ?Neck:  ?   Thyroid: No thyromegaly.  ?   Vascular: No carotid bruit.  ?Cardiovascular:  ?   Rate and Rhythm: Normal rate and regular rhythm.  ?   Heart sounds: Normal heart sounds. No murmur heard. ?  No gallop.  ?Pulmonary:  ?   Effort: Pulmonary effort is normal. No accessory muscle usage or  respiratory distress.  ?   Breath sounds: Normal breath sounds.  ?Abdominal:  ?   General: Bowel sounds are normal.  ?   Palpations: Abdomen is soft.  ?Musculoskeletal:  ?   Cervical back: Normal range of motion and neck supple.  ?   Lumbar back: Tenderness present. No swelling, edema, spasms or bony tenderness. Normal range of motion. Negative right straight leg raise test and negative left straight leg raise test.  ?   Right knee: Crepitus present. No swelling, erythema, ecchymosis or bony tenderness. Normal range of motion.  ?   Instability Tests: Anterior drawer test negative. Posterior drawer test negative. Anterior Lachman test negative. Medial McMurray test negative and lateral McMurray test negative.  ?   Left knee: Normal.  ?   Right lower leg: No edema.  ?   Left lower leg: No edema.  ?Lymphadenopathy:  ?   Cervical: No cervical adenopathy.  ?Skin: ?   General: Skin is warm and dry.  ?Neurological:  ?   Mental Status: She is alert and  oriented to person, place, and time.  ?Psychiatric:     ?   Attention and Perception: Attention normal.     ?   Mood and Affect: Mood normal.     ?   Speech: Speech normal.     ?   Behavior: Behavior normal. Behavior is cooperative.     ?   Thought Content: Thought content normal.  ? ? ?Results for orders placed or performed in visit on 02/24/22  ?Novel Coronavirus, NAA ([facility])  ? Specimen: Nasopharyngeal(NP) swabs in vial transport medium  ?Result Value Ref Range  ? SARS-CoV-2, NAA Not Detected Not Detected  ?Rapid Strep screen([facility])  ? Specimen: Other  ? Other  ?Result Value Ref Range  ? Strep Gp A Ag, IA W/Reflex Negative Negative  ?Microscopic Examination  ? Urine  ?Result Value Ref Range  ? WBC, UA 0-5 0 - 5 /hpf  ? RBC 0-2 0 - 2 /hpf  ? Epithelial Cells (non renal) 0-10 0 - 10 /hpf  ? Bacteria, UA Few (A) None seen/Few  ?Culture, Group A Strep  ? Other  ?Result Value Ref Range  ? Strep A Culture Negative   ?Influenza A & B (STAT)  ?Result Value Ref Range  ? Influenza A Negative Negative  ? Influenza B Negative Negative  ?Urinalysis, Routine w reflex microscopic  ?Result Value Ref Range  ? Specific Gravity, UA 1.025 1.005 - 1.030  ? pH, UA 6.0 5.0 - 7.5  ? Color, UA Yellow Yellow  ? Appearance Ur Cloudy (A) Clear  ? Leukocytes,UA Trace (A) Negative  ? Protein,UA Negative Negative/Trace  ? Glucose, UA Negative Negative  ? Ketones, UA Negative Negative  ? RBC, UA Trace (A) Negative  ? Bilirubin, UA Negative Negative  ? Urobilinogen, Ur 0.2 0.2 - 1.0 mg/dL  ? Nitrite, UA Negative Negative  ? Microscopic Examination See below:   ? ?   ?Assessment & Plan:  ? ?Problem List Items Addressed This Visit   ? ?  ? Other  ? Anxiety  ?  Controlled when she is on Wellbutrin.  Is restarting medication on Monday. Will reevaluate in 1 month.  ?  ?  ? Nicotine dependence, cigarettes, uncomplicated - Primary  ?  Has been on Wellbutrin for Smoking Cessation.  Was working well then she had to stop it for her allergy  testing this week.  Plans to restart on Monday.  Will follow up in  1 month for reevaluation.  Can increase dose if needed at that time. ?  ?  ? Multiple joint pain  ?  Improved since last visit.  ESR and C reactive protein were elevated at last visit. Will recheck labs at follow up to ensure improvement.  Follow up in 1 month for reevaluation. ?  ?  ?  ?Time: 25 minutes, >50% spent counseling/or care coordination ? ? ?Follow up plan: ?Return in about 1 month (around 04/11/2022) for Smoking cessation. ? ? ? ? ? ?

## 2022-03-16 ENCOUNTER — Ambulatory Visit: Payer: Medicaid Other

## 2022-03-18 ENCOUNTER — Ambulatory Visit: Payer: Medicaid Other

## 2022-03-19 ENCOUNTER — Ambulatory Visit: Payer: Medicaid Other | Admitting: Podiatry

## 2022-04-13 NOTE — Progress Notes (Signed)
? ?BP (!) 133/91   Pulse 84   Temp 98.8 ?F (37.1 ?C) (Oral)   Wt 259 lb (117.5 kg)   SpO2 98%   BMI 41.80 kg/m?   ? ?Subjective:  ? ? Patient ID: Samantha Villegas, female    DOB: 01/13/1991, 31 y.o.   MRN: [id number] ? ?HPI: ?Samantha DunkerVanessa Taneisha Derks is a 31 y.o. female ? ?Chief Complaint  ?Patient presents with  ? Nicotine Dependence  ?  Follow up   ? Back Pain  ?  Back pain ongoing, very stiff in the mornings. No improving.   ?   ? ?BACK PAIN ?Patient states her pain has worsened since her last visit.  She doesn't feel like she did anything to injure her back.  States it is painful to roll over and to stand after sitting.  She has a burning sensation and stiffness.  Difficult for her to stand straight.   ? ?Patient states she has large tonsils and would like to see an ENT.  Denies sore throat. ? ? ?Patient hasn't been taking her Wellbutrin to help with the smoking cessation.  She has been stressed and haven't started taking.  ? ?Relevant past medical, surgical, family and social history reviewed and updated as indicated. Interim medical history since our last visit reviewed. ?Allergies and medications reviewed and updated. ? ?Review of Systems  ?Constitutional:  Negative for activity change, appetite change, diaphoresis, fatigue and fever.  ?HENT:  Negative for sore throat.   ?     Large tonsils.  ?Respiratory:  Negative for cough, chest tightness and shortness of breath.   ?Cardiovascular:  Negative for chest pain, palpitations and leg swelling.  ?Gastrointestinal: Negative.   ?Musculoskeletal:  Positive for back pain.  ?Neurological: Negative.   ? ?Per HPI unless specifically indicated above ? ?   ?Objective:  ?  ?BP (!) 133/91   Pulse 84   Temp 98.8 ?F (37.1 ?C) (Oral)   Wt 259 lb (117.5 kg)   SpO2 98%   BMI 41.80 kg/m?   ?Wt Readings from Last 3 Encounters:  ?04/14/22 259 lb (117.5 kg)  ?03/12/22 260 lb 3.2 oz (118 kg)  ?02/24/22 259 lb 12.8 oz (117.8 kg)  ?  ?Physical Exam ?Vitals and nursing  note reviewed.  ?Constitutional:   ?   General: She is awake. She is not in acute distress. ?   Appearance: She is well-developed and well-groomed. She is obese. She is not ill-appearing or toxic-appearing.  ?HENT:  ?   Head: Normocephalic.  ?   Jaw: No tenderness (to left), swelling or pain on movement (to left).  ?   Comments: Left jaw popping with movement. ?   Right Ear: Hearing normal.  ?   Left Ear: Hearing normal.  ?   Nose: Nose normal.  ?   Mouth/Throat:  ?   Mouth: Mucous membranes are moist.  ?   Pharynx: No oropharyngeal exudate or posterior oropharyngeal erythema.  ?Eyes:  ?   General: Lids are normal.     ?   Right eye: No discharge.     ?   Left eye: No discharge.  ?   Conjunctiva/sclera: Conjunctivae normal.  ?   Pupils: Pupils are equal, round, and reactive to light.  ?Neck:  ?   Thyroid: No thyromegaly.  ?   Vascular: No carotid bruit.  ?Cardiovascular:  ?   Rate and Rhythm: Normal rate and regular rhythm.  ?   Heart sounds: Normal heart sounds. No  murmur heard. ?  No gallop.  ?Pulmonary:  ?   Effort: Pulmonary effort is normal. No accessory muscle usage or respiratory distress.  ?   Breath sounds: Normal breath sounds.  ?Abdominal:  ?   General: Bowel sounds are normal.  ?   Palpations: Abdomen is soft.  ?Musculoskeletal:  ?   Cervical back: Normal range of motion and neck supple.  ?   Lumbar back: Tenderness present. No swelling, edema, spasms or bony tenderness. Normal range of motion. Negative right straight leg raise test and negative left straight leg raise test.  ?   Right knee: Crepitus present. No swelling, erythema, ecchymosis or bony tenderness. Normal range of motion.  ?   Instability Tests: Anterior drawer test negative. Posterior drawer test negative. Anterior Lachman test negative. Medial McMurray test negative and lateral McMurray test negative.  ?   Left knee: Normal.  ?   Right lower leg: No edema.  ?   Left lower leg: No edema.  ?Lymphadenopathy:  ?   Cervical: No cervical  adenopathy.  ?Skin: ?   General: Skin is warm and dry.  ?Neurological:  ?   Mental Status: She is alert and oriented to person, place, and time.  ?Psychiatric:     ?   Attention and Perception: Attention normal.     ?   Mood and Affect: Mood normal.     ?   Speech: Speech normal.     ?   Behavior: Behavior normal. Behavior is cooperative.     ?   Thought Content: Thought content normal.  ? ? ?Results for orders placed or performed in visit on 02/24/22  ?Novel Coronavirus, NAA ([facility])  ? Specimen: Nasopharyngeal(NP) swabs in vial transport medium  ?Result Value Ref Range  ? SARS-CoV-2, NAA Not Detected Not Detected  ?Rapid Strep screen([facility])  ? Specimen: Other  ? Other  ?Result Value Ref Range  ? Strep Gp A Ag, IA W/Reflex Negative Negative  ?Microscopic Examination  ? Urine  ?Result Value Ref Range  ? WBC, UA 0-5 0 - 5 /hpf  ? RBC 0-2 0 - 2 /hpf  ? Epithelial Cells (non renal) 0-10 0 - 10 /hpf  ? Bacteria, UA Few (A) None seen/Few  ?Culture, Group A Strep  ? Other  ?Result Value Ref Range  ? Strep A Culture Negative   ?Influenza A & B (STAT)  ?Result Value Ref Range  ? Influenza A Negative Negative  ? Influenza B Negative Negative  ?Urinalysis, Routine w reflex microscopic  ?Result Value Ref Range  ? Specific Gravity, UA 1.025 1.005 - 1.030  ? pH, UA 6.0 5.0 - 7.5  ? Color, UA Yellow Yellow  ? Appearance Ur Cloudy (A) Clear  ? Leukocytes,UA Trace (A) Negative  ? Protein,UA Negative Negative/Trace  ? Glucose, UA Negative Negative  ? Ketones, UA Negative Negative  ? RBC, UA Trace (A) Negative  ? Bilirubin, UA Negative Negative  ? Urobilinogen, Ur 0.2 0.2 - 1.0 mg/dL  ? Nitrite, UA Negative Negative  ? Microscopic Examination See below:   ? ?   ?Assessment & Plan:  ? ?Problem List Items Addressed This Visit   ? ?  ? Other  ? Nicotine dependence, cigarettes, uncomplicated - Primary  ?  Chronic. Not smoking cigarettes but smoking two black and mild daily.  Discussed restarting medication and stopping  smoking altogether.  Will follow up in 2 months for reevaluation. ? ?  ?  ? ?Other Visit Diagnoses   ? ?  Chronic low back pain without sciatica, unspecified back pain laterality      ? Relevant Orders  ? Ambulatory referral to Orthopedics  ? Large tonsils      ? Referral placed for ENT at patient's request.  ? Relevant Orders  ? Ambulatory referral to ENT  ? ?  ?  ?Time: 25 minutes, >50% spent counseling/or care coordination ? ? ?Follow up plan: ?Return in about 2 months (around 06/14/2022) for Smoking Cessation. ? ? ? ? ? ?

## 2022-04-14 ENCOUNTER — Encounter: Payer: Self-pay | Admitting: Nurse Practitioner

## 2022-04-14 ENCOUNTER — Ambulatory Visit (INDEPENDENT_AMBULATORY_CARE_PROVIDER_SITE_OTHER): Payer: Medicaid Other | Admitting: Nurse Practitioner

## 2022-04-14 VITALS — BP 133/91 | HR 84 | Temp 98.8°F | Wt 259.0 lb

## 2022-04-14 DIAGNOSIS — J351 Hypertrophy of tonsils: Secondary | ICD-10-CM

## 2022-04-14 DIAGNOSIS — M545 Low back pain, unspecified: Secondary | ICD-10-CM

## 2022-04-14 DIAGNOSIS — G8929 Other chronic pain: Secondary | ICD-10-CM

## 2022-04-14 DIAGNOSIS — F17210 Nicotine dependence, cigarettes, uncomplicated: Secondary | ICD-10-CM

## 2022-04-14 NOTE — Assessment & Plan Note (Signed)
Chronic. Not smoking cigarettes but smoking two black and mild daily.  Discussed restarting medication and stopping smoking altogether.  Will follow up in 2 months for reevaluation. ?

## 2022-06-16 ENCOUNTER — Ambulatory Visit: Payer: Medicaid Other | Admitting: Nurse Practitioner

## 2022-06-25 ENCOUNTER — Ambulatory Visit: Payer: Medicaid Other | Admitting: Nurse Practitioner

## 2022-07-06 DIAGNOSIS — M5416 Radiculopathy, lumbar region: Secondary | ICD-10-CM | POA: Insufficient documentation

## 2022-09-24 DIAGNOSIS — L732 Hidradenitis suppurativa: Secondary | ICD-10-CM | POA: Insufficient documentation

## 2022-09-24 NOTE — Progress Notes (Signed)
BP 128/83   Pulse 86   Temp 98.8 F (37.1 C) (Oral)   Wt 263 lb (119.3 kg)   SpO2 (!) 86%   BMI 42.45 kg/m    Subjective:    Patient ID: Samantha Villegas, female    DOB: November 23, 1991, 31 y.o.   MRN: [id number]  HPI: Samantha Villegas is a 31 y.o. female  Chief Complaint  Patient presents with   Recurrent Skin Infections    Pt reports she was advised to follow up with PCP regarding ?HSV, recurrent boils.    Patient states she saw GYN who said that she needed to be seen by PCP for recurrent skin infections.  She gets them under neath her axilla, breasts, and groin area.  Does have on under neath right breast right now.  It is painful, sometimes drains, firm, and bruised.     Relevant past medical, surgical, family and social history reviewed and updated as indicated. Interim medical history since our last visit reviewed. Allergies and medications reviewed and updated.  Review of Systems  Skin:        boils    Per HPI unless specifically indicated above     Objective:    BP 128/83   Pulse 86   Temp 98.8 F (37.1 C) (Oral)   Wt 263 lb (119.3 kg)   SpO2 (!) 86%   BMI 42.45 kg/m   Wt Readings from Last 3 Encounters:  09/25/22 263 lb (119.3 kg)  04/14/22 259 lb (117.5 kg)  03/12/22 260 lb 3.2 oz (118 kg)    Physical Exam Vitals and nursing note reviewed.  Constitutional:      General: She is not in acute distress.    Appearance: Normal appearance. She is normal weight. She is not ill-appearing, toxic-appearing or diaphoretic.  HENT:     Head: Normocephalic.     Right Ear: External ear normal.     Left Ear: External ear normal.     Nose: Nose normal.     Mouth/Throat:     Mouth: Mucous membranes are moist.     Pharynx: Oropharynx is clear.  Eyes:     General:        Right eye: No discharge.        Left eye: No discharge.     Extraocular Movements: Extraocular movements intact.     Conjunctiva/sclera: Conjunctivae normal.     Pupils: Pupils are  equal, round, and reactive to light.  Cardiovascular:     Rate and Rhythm: Normal rate and regular rhythm.     Heart sounds: No murmur heard. Pulmonary:     Effort: Pulmonary effort is normal. No respiratory distress.     Breath sounds: Normal breath sounds. No wheezing or rales.  Musculoskeletal:     Cervical back: Normal range of motion and neck supple.  Skin:    General: Skin is warm and dry.     Capillary Refill: Capillary refill takes less than 2 seconds.       Neurological:     General: No focal deficit present.     Mental Status: She is alert and oriented to person, place, and time. Mental status is at baseline.  Psychiatric:        Mood and Affect: Mood normal.        Behavior: Behavior normal.        Thought Content: Thought content normal.        Judgment: Judgment normal.     Results  for orders placed or performed in visit on 02/24/22  Novel Coronavirus, NAA ([facility])   Specimen: Nasopharyngeal(NP) swabs in vial transport medium  Result Value Ref Range   SARS-CoV-2, NAA Not Detected Not Detected  Rapid Strep screen([facility])   Specimen: Other   Other  Result Value Ref Range   Strep Gp A Ag, IA W/Reflex Negative Negative  Microscopic Examination   Urine  Result Value Ref Range   WBC, UA 0-5 0 - 5 /hpf   RBC, Urine 0-2 0 - 2 /hpf   Epithelial Cells (non renal) 0-10 0 - 10 /hpf   Bacteria, UA Few (A) None seen/Few  Culture, Group A Strep   Other  Result Value Ref Range   Strep A Culture Negative   Influenza A & B (STAT)  Result Value Ref Range   Influenza A Negative Negative   Influenza B Negative Negative  Urinalysis, Routine w reflex microscopic  Result Value Ref Range   Specific Gravity, UA 1.025 1.005 - 1.030   pH, UA 6.0 5.0 - 7.5   Color, UA Yellow Yellow   Appearance Ur Cloudy (A) Clear   Leukocytes,UA Trace (A) Negative   Protein,UA Negative Negative/Trace   Glucose, UA Negative Negative   Ketones, UA Negative Negative   RBC, UA  Trace (A) Negative   Bilirubin, UA Negative Negative   Urobilinogen, Ur 0.2 0.2 - 1.0 mg/dL   Nitrite, UA Negative Negative   Microscopic Examination See below:       Assessment & Plan:   Problem List Items Addressed This Visit   None Visit Diagnoses     Hidradenitis suppurativa    -  Primary   Will refer to Dermatology for treatment. Inflammatory markers elevated in the past will repeat. Suspect it is related to Hidradenitis. FU in 1 month.   Relevant Orders   Ambulatory referral to Dermatology   Comp Met (CMET)   Sed Rate (ESR)   C-reactive protein   CBC w/Diff        Follow up plan: Return in about 1 month (around 10/26/2022) for Physical and Fasting labs.

## 2022-09-25 ENCOUNTER — Encounter: Payer: Self-pay | Admitting: Nurse Practitioner

## 2022-09-25 ENCOUNTER — Ambulatory Visit (INDEPENDENT_AMBULATORY_CARE_PROVIDER_SITE_OTHER): Payer: Medicaid Other | Admitting: Nurse Practitioner

## 2022-09-25 VITALS — BP 128/83 | HR 86 | Temp 98.8°F | Wt 263.0 lb

## 2022-09-25 DIAGNOSIS — L732 Hidradenitis suppurativa: Secondary | ICD-10-CM

## 2022-09-26 LAB — CBC WITH DIFFERENTIAL/PLATELET
Basophils Absolute: 0.1 10*3/uL (ref 0.0–0.2)
Basos: 1 %
EOS (ABSOLUTE): 0.5 10*3/uL — ABNORMAL HIGH (ref 0.0–0.4)
Eos: 4 %
Hematocrit: 42.1 % (ref 34.0–46.6)
Hemoglobin: 13.9 g/dL (ref 11.1–15.9)
Immature Grans (Abs): 0 10*3/uL (ref 0.0–0.1)
Immature Granulocytes: 0 %
Lymphocytes Absolute: 3.6 10*3/uL — ABNORMAL HIGH (ref 0.7–3.1)
Lymphs: 29 %
MCH: 29.4 pg (ref 26.6–33.0)
MCHC: 33 g/dL (ref 31.5–35.7)
MCV: 89 fL (ref 79–97)
Monocytes Absolute: 0.7 10*3/uL (ref 0.1–0.9)
Monocytes: 5 %
Neutrophils Absolute: 7.3 10*3/uL — ABNORMAL HIGH (ref 1.4–7.0)
Neutrophils: 61 %
Platelets: 325 10*3/uL (ref 150–450)
RBC: 4.73 x10E6/uL (ref 3.77–5.28)
RDW: 12.7 % (ref 11.7–15.4)
WBC: 12.1 10*3/uL — ABNORMAL HIGH (ref 3.4–10.8)

## 2022-09-26 LAB — COMPREHENSIVE METABOLIC PANEL
ALT: 12 IU/L (ref 0–32)
AST: 10 IU/L (ref 0–40)
Albumin/Globulin Ratio: 1.5 (ref 1.2–2.2)
Albumin: 4.1 g/dL (ref 3.9–4.9)
Alkaline Phosphatase: 79 IU/L (ref 44–121)
BUN/Creatinine Ratio: 12 (ref 9–23)
BUN: 8 mg/dL (ref 6–20)
Bilirubin Total: <0.2 mg/dL (ref 0.0–1.2)
CO2: 22 mmol/L (ref 20–29)
Calcium: 9.1 mg/dL (ref 8.7–10.2)
Chloride: 107 mmol/L — ABNORMAL HIGH (ref 96–106)
Creatinine, Ser: 0.66 mg/dL (ref 0.57–1.00)
Globulin, Total: 2.7 g/dL (ref 1.5–4.5)
Glucose: 89 mg/dL (ref 70–99)
Potassium: 4.3 mmol/L (ref 3.5–5.2)
Sodium: 142 mmol/L (ref 134–144)
Total Protein: 6.8 g/dL (ref 6.0–8.5)
eGFR: 119 mL/min/{1.73_m2} (ref >59)

## 2022-09-26 LAB — SEDIMENTATION RATE: Sed Rate: 43 mm/hr — ABNORMAL HIGH (ref 0–32)

## 2022-09-26 LAB — C-REACTIVE PROTEIN: CRP: 13 mg/L — ABNORMAL HIGH (ref 0–10)

## 2022-09-28 NOTE — Progress Notes (Signed)
Hi Samantha Villegas.  Your lab work still shows that your inflammatory markers are elevated.  Again I believe it is related to the boils you are experiencing.  Continue with the plan as discussed during our visit.

## 2022-09-30 ENCOUNTER — Ambulatory Visit: Payer: Medicaid Other | Admitting: Gastroenterology

## 2022-10-09 ENCOUNTER — Other Ambulatory Visit: Payer: Self-pay

## 2022-10-09 ENCOUNTER — Emergency Department
Admission: EM | Admit: 2022-10-09 | Discharge: 2022-10-09 | Disposition: A | Discharge status: Home / Self Care | Payer: Medicaid Other | Attending: Emergency Medicine | Admitting: Emergency Medicine

## 2022-10-09 ENCOUNTER — Emergency Department: Payer: Medicaid Other

## 2022-10-09 DIAGNOSIS — N3000 Acute cystitis without hematuria: Secondary | ICD-10-CM | POA: Insufficient documentation

## 2022-10-09 DIAGNOSIS — S39012A Strain of muscle, fascia and tendon of lower back, initial encounter: Secondary | ICD-10-CM | POA: Diagnosis not present

## 2022-10-09 DIAGNOSIS — R300 Dysuria: Secondary | ICD-10-CM | POA: Diagnosis not present

## 2022-10-09 DIAGNOSIS — X500XXA Overexertion from strenuous movement or load, initial encounter: Secondary | ICD-10-CM | POA: Diagnosis not present

## 2022-10-09 DIAGNOSIS — R350 Frequency of micturition: Secondary | ICD-10-CM | POA: Insufficient documentation

## 2022-10-09 DIAGNOSIS — S3992XA Unspecified injury of lower back, initial encounter: Secondary | ICD-10-CM | POA: Diagnosis present

## 2022-10-09 DIAGNOSIS — Y92002 Bathroom of unspecified non-institutional (private) residence single-family (private) house as the place of occurrence of the external cause: Secondary | ICD-10-CM | POA: Insufficient documentation

## 2022-10-09 LAB — URINALYSIS, ROUTINE W REFLEX MICROSCOPIC
Bilirubin Urine: NEGATIVE
Glucose, UA: NEGATIVE mg/dL
Hgb urine dipstick: NEGATIVE
Ketones, ur: NEGATIVE mg/dL
Nitrite: POSITIVE — AB
Protein, ur: NEGATIVE mg/dL
Specific Gravity, Urine: 1.018 (ref 1.005–1.030)
pH: 5 (ref 5.0–8.0)

## 2022-10-09 MED ORDER — KETOROLAC TROMETHAMINE 30 MG/ML IJ SOLN
30.0000 mg | Freq: Once | INTRAMUSCULAR | Status: AC
  Administered 2022-10-09: 30 mg via INTRAMUSCULAR
  Filled 2022-10-09: qty 1

## 2022-10-09 MED ORDER — NABUMETONE 750 MG PO TABS: 750.0000 mg | ORAL_TABLET | Freq: Two times a day (BID) | ORAL | 0 refills | Status: DC

## 2022-10-09 MED ORDER — BACLOFEN 10 MG PO TABS
10.0000 mg | ORAL_TABLET | Freq: Once | ORAL | Status: AC
  Administered 2022-10-09: 10 mg via ORAL
  Filled 2022-10-09: qty 1

## 2022-10-09 MED ORDER — LIDOCAINE 5 % EX PTCH
1.0000 | MEDICATED_PATCH | Freq: Once | CUTANEOUS | Status: DC
  Administered 2022-10-09: 1 via TRANSDERMAL
  Filled 2022-10-09: qty 1

## 2022-10-09 MED ORDER — BACLOFEN 10 MG PO TABS: 10.0000 mg | ORAL_TABLET | Freq: Three times a day (TID) | ORAL | 0 refills | Status: AC | PRN

## 2022-10-09 MED ORDER — LIDOCAINE 5 % EX PTCH: 1.0000 | MEDICATED_PATCH | Freq: Two times a day (BID) | CUTANEOUS | 0 refills | Status: DC | PRN

## 2022-10-09 MED ORDER — CEPHALEXIN 500 MG PO CAPS: 500.0000 mg | ORAL_CAPSULE | Freq: Three times a day (TID) | ORAL | 0 refills | Status: AC

## 2022-10-09 NOTE — Discharge Instructions (Addendum)
Your exam, labs, and XR are normal and reassuring. You do have evidence of a UTI. Take the antibiotic as directed. Take the pain medicines as prescribed. Follow-up with your primary provider for continued symptoms.

## 2022-10-09 NOTE — ED Notes (Signed)
POC URINE PREGNANCY NEGATIVE

## 2022-10-09 NOTE — ED Triage Notes (Signed)
Pt arrives with c/o lower back pain that started after pt bent over in the bathroom. Per pt, the is sharp in nature. Pt ambulatory in triage. Pt denies incontinence urine or stool.

## 2022-10-09 NOTE — ED Provider Notes (Signed)
[facility] Emergency Department Provider Note     Event Date/Time   First MD Initiated Contact with Patient 10/09/22 1932     (approximate)   History   Back Pain   HPI  Samantha Villegas is a 31 y.o. female presents to the ED for evaluation of low back pain with sudden onset after she bent over in the bathroom.  Patient reports that the pain is sharp in nature patient denies any difficulty breathing, or ambulating patient denies any bladder or bowel incontinence.  She has however, had some ongoing dysuria and urinary frequency.  She reports being treated 2 weeks ago for BV.     Physical Exam   Triage Vital Signs: ED Triage Vitals  Enc Vitals Group     BP 10/09/22 1855 113/65     Pulse Rate 10/09/22 1855 98     Resp 10/09/22 1855 18     Temp 10/09/22 1855 98 F (36.7 C)     Temp Source 10/09/22 1855 Oral     SpO2 10/09/22 1855 95 %     Weight 10/09/22 1856 265 lb (120.2 kg)     Height --      Head Circumference --      Peak Flow --      Pain Score 10/09/22 1856 10     Pain Loc --      Pain Edu? --      Excl. in [place]? --     Most recent vital signs: Vitals:   10/09/22 1855  BP: 113/65  Pulse: 98  Resp: 18  Temp: 98 F (36.7 C)  SpO2: 95%    General Awake, no distress. NAD CV:  Good peripheral perfusion.  RESP:  Normal effort.  ABD:  No distention.  MSK:  Spinal alignment without midline tenderness, spasm, deformity, or step-off. NEURO: Cranial nerves II through XII grossly intact.  Normal LE DTRs bilaterally.  Negative supine straight leg raise bilaterally.   ED Results / Procedures / Treatments   Labs (all labs ordered are listed, but only abnormal results are displayed) Labs Reviewed  URINALYSIS, ROUTINE W REFLEX MICROSCOPIC - Abnormal; Notable for the following components:      Result Value   Color, Urine YELLOW (*)    APPearance CLOUDY (*)    Nitrite POSITIVE (*)    Leukocytes,Ua MODERATE (*)    Bacteria, UA  RARE (*)    All other components within normal limits  POC URINE PREG, ED     EKG   RADIOLOGY  I personally viewed and evaluated these images as part of my medical decision making, as well as reviewing the written report by the radiologist.  ED Provider Interpretation: no acute findings  DG Lumbar Spine Complete  Result Date: 10/09/2022 CLINICAL DATA:  Back pain EXAM: LUMBAR SPINE - COMPLETE 4+ VIEW COMPARISON:  08/14/2021 FINDINGS: There is no evidence of lumbar spine fracture. Alignment is normal. Intervertebral disc spaces are maintained. Minimal anterior endplate spurring at C5-8. Unremarkable facet joints. IMPRESSION: Negative. Electronically Signed   By: Davina Poke D.O.   On: 10/09/2022 21:53     PROCEDURES:  Critical Care performed: No  Procedures   MEDICATIONS ORDERED IN ED: Medications  lidocaine (LIDODERM) 5 % 1 patch (1 patch Transdermal Patch Applied 10/09/22 2238)  ketorolac (TORADOL) 30 MG/ML injection 30 mg (30 mg Intramuscular Given 10/09/22 2201)  baclofen (LIORESAL) tablet 10 mg (10 mg Oral Given 10/09/22 2238)     IMPRESSION /  MDM / ASSESSMENT AND PLAN / ED COURSE  I reviewed the triage vital signs and the nursing notes.                              Differential diagnosis includes, but is not limited to, lumbar strain, lumbar radiculopathy, pyelonephritis, UTI  Patient's presentation is most consistent with acute complicated illness / injury requiring diagnostic workup.  Patient to the ED for evaluation of acute low back pain.  Patient was evaluated for complaints, found have reassuring work-up overall.  No radiologic evidence of any acute fracture or dislocation.  Exam is consistent with a probable lumbar strain.  No red flags on exam.  Patient's UA did confirm some nitrites and some leukocyturia.  Patient's diagnosis is consistent with Veshana UTI. Patient will be discharged home with prescriptions for baclofen, Relafen, Lidoderm patches, and  Keflex. Patient is to follow up with primary provider as needed or otherwise directed. Patient is given ED precautions to return to the ED for any worsening or new symptoms.     FINAL CLINICAL IMPRESSION(S) / ED DIAGNOSES   Final diagnoses:  Strain of lumbar region, initial encounter  Acute cystitis without hematuria     Rx / DC Orders   ED Discharge Orders          Ordered    lidocaine (LIDODERM) 5 %  Every 12 hours PRN        10/09/22 2234    baclofen (LIORESAL) 10 MG tablet  3 times daily PRN        10/09/22 2234    nabumetone (RELAFEN) 750 MG tablet  2 times daily        10/09/22 2234    cephALEXin (KEFLEX) 500 MG capsule  3 times daily        10/09/22 2235             Note:  This document was prepared using Dragon voice recognition software and may include unintentional dictation errors.    Lissa Hoard, PA-C 10/09/22 2254    Willy Eddy, MD 10/09/22 2326

## 2022-10-12 ENCOUNTER — Telehealth: Payer: Self-pay
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
## 2022-10-12 NOTE — Telephone Encounter (Signed)
Transition Care Management Follow-up Telephone Call Date of discharge and from where: 10/09/2022, [facility] How have you been since you were released from the hospital? Patient reports she feels like she has worsened.  Any questions or concerns? No  Items Reviewed: Did the pt receive and understand the discharge instructions provided? Yes  Medications obtained and verified? Yes  Other? No  Any new allergies since your discharge? No  Dietary orders reviewed? No Do you have support at home? No   Home Care and Equipment/Supplies: Were home health services ordered? no If so, what is the name of the agency?   Has the agency set up a time to come to the patient's home? not applicable Were any new equipment or medical supplies ordered?  No What is the name of the medical supply agency?  Were you able to get the supplies/equipment? not applicable Do you have any questions related to the use of the equipment or supplies? No  Functional Questionnaire: (I = Independent and D = Dependent) ADLs: I  Bathing/Dressing- Patient states barely, it is painful but she can.  Meal Prep- I  Eating- I  Maintaining continence- I  Transferring/Ambulation- I, states it is very painful   Managing Meds- I  Follow up appointments reviewed:  PCP Hospital f/u appt confirmed? Yes  Scheduled to see Jon Billings, NP on 10/13/2022 @ 9:20 AM. [facility] f/u appt confirmed? No   Are transportation arrangements needed? No  If their condition worsens, is the pt aware to call PCP or go to the Emergency Dept.? Yes Was the patient provided with contact information for the PCP's office or ED? Yes Was to pt encouraged to call back with questions or concerns? Yes

## 2022-10-13 ENCOUNTER — Ambulatory Visit: Payer: Medicaid Other | Admitting: Nurse Practitioner

## 2022-10-13 ENCOUNTER — Encounter: Payer: Self-pay | Admitting: Nurse Practitioner

## 2022-10-13 VITALS — BP 111/70 | HR 86 | Temp 98.7°F | Wt 260.5 lb

## 2022-10-13 DIAGNOSIS — Z6841 Body Mass Index (BMI) 40.0 and over, adult: Secondary | ICD-10-CM | POA: Diagnosis not present

## 2022-10-13 DIAGNOSIS — M545 Low back pain, unspecified: Secondary | ICD-10-CM | POA: Diagnosis not present

## 2022-10-13 MED ORDER — PREDNISONE 10 MG PO TABS: 10.0000 mg | ORAL_TABLET | Freq: Every day | ORAL | 0 refills | Status: DC

## 2022-10-13 NOTE — Assessment & Plan Note (Addendum)
BMI 42.  Recommended eating smaller high protein, low fat meals more frequently and exercising 30 mins a day 5 times a week with a goal of 10-15lb weight loss in the next 3 months. Patient voiced their understanding and motivation to adhere to these recommendations. Recommend a healthy lifestyle through diet and exercise.

## 2022-11-02 ENCOUNTER — Encounter: Payer: Self-pay | Admitting: Nurse Practitioner

## 2022-11-02 ENCOUNTER — Ambulatory Visit
Admission: RE | Admit: 2022-11-02 | Discharge: 2022-11-02 | Disposition: A | Discharge status: Home / Self Care | Payer: Medicaid Other | Source: Ambulatory Visit | Attending: Nurse Practitioner | Admitting: Nurse Practitioner

## 2022-11-02 ENCOUNTER — Ambulatory Visit
Admission: RE | Admit: 2022-11-02 | Discharge: 2022-11-02 | Disposition: A | Discharge status: Home / Self Care | Payer: Medicaid Other | Attending: Nurse Practitioner | Admitting: Nurse Practitioner

## 2022-11-02 ENCOUNTER — Ambulatory Visit (INDEPENDENT_AMBULATORY_CARE_PROVIDER_SITE_OTHER): Payer: Medicaid Other | Admitting: Nurse Practitioner

## 2022-11-02 VITALS — BP 102/66 | HR 84 | Ht 66.0 in | Wt 260.3 lb

## 2022-11-02 DIAGNOSIS — M549 Dorsalgia, unspecified: Secondary | ICD-10-CM | POA: Diagnosis present

## 2022-11-02 DIAGNOSIS — N898 Other specified noninflammatory disorders of vagina: Secondary | ICD-10-CM

## 2022-11-02 DIAGNOSIS — B379 Candidiasis, unspecified: Secondary | ICD-10-CM

## 2022-11-02 DIAGNOSIS — Z6841 Body Mass Index (BMI) 40.0 and over, adult: Secondary | ICD-10-CM

## 2022-11-02 LAB — URINALYSIS, ROUTINE W REFLEX MICROSCOPIC
Bilirubin, UA: NEGATIVE
Glucose, UA: NEGATIVE
Ketones, UA: NEGATIVE
Leukocytes,UA: NEGATIVE
Nitrite, UA: NEGATIVE
Protein,UA: NEGATIVE
RBC, UA: NEGATIVE
Specific Gravity, UA: 1.02 (ref 1.005–1.030)
Urobilinogen, Ur: 1 mg/dL (ref 0.2–1.0)
pH, UA: 5.5 (ref 5.0–7.5)

## 2022-11-02 LAB — WET PREP FOR TRICH, YEAST, CLUE
Clue Cell Exam: NEGATIVE
Trichomonas Exam: NEGATIVE
Yeast Exam: NEGATIVE

## 2022-11-02 MED ORDER — FLUCONAZOLE 150 MG PO TABS: 150.0000 mg | ORAL_TABLET | Freq: Once | ORAL | 0 refills | Status: AC

## 2022-11-02 MED ORDER — PREDNISONE 10 MG PO TABS: 10.0000 mg | ORAL_TABLET | Freq: Every day | ORAL | 0 refills | Status: DC

## 2022-11-02 NOTE — Progress Notes (Signed)
BP 102/66   Pulse 84   Ht 5\' 6"  (1.676 m)   Wt 260 lb 4.8 oz (118.1 kg)   SpO2 98%   BMI 42.01 kg/m    Subjective:    Patient ID: Samantha Villegas, female    DOB: [birth year], 31 y.o.   MRN: [id number]  HPI: Samantha Villegas is a 31 y.o. female  Chief Complaint  Patient presents with   Back Pain    Pt states she has been having mid back pain since Saturday. States she does not remember pulling anything or injuring herself.    Vaginal Discharge    Pt states she is still having issues with vaginal discharge, feels like she still has BV   URINARY SYMPTOMS Patient states she has been feeling like she has a UTI since Wednesday.  Her back started hurting after that.  Hurts more when she coughs or moves.   Dysuria: yes Urinary frequency: no Urgency: no Small volume voids: sometimes Symptom severity: no Urinary incontinence: no Foul odor: no Hematuria: no Abdominal pain: no Back pain: yes Suprapubic pain/pressure: no Flank pain: no Fever:  no Vomiting: no Relief with cranberry juice: no Relief with pyridium: no Status: stable Previous urinary tract infection: no Recurrent urinary tract infection: no History of sexually transmitted disease: no Penile discharge: no Treatments attempted:  yeast cream     Relevant past medical, surgical, family and social history reviewed and updated as indicated. Interim medical history since our last visit reviewed. Allergies and medications reviewed and updated.  Review of Systems  Constitutional:  Negative for fever.  Gastrointestinal:  Negative for abdominal pain and vomiting.  Genitourinary:  Positive for dysuria, urgency and vaginal discharge. Negative for decreased urine volume, flank pain, frequency and hematuria.  Musculoskeletal:  Positive for back pain.    Per HPI unless specifically indicated above     Objective:    BP 102/66   Pulse 84   Ht 5\' 6"  (1.676 m)   Wt 260 lb 4.8 oz (118.1 kg)   SpO2 98%    BMI 42.01 kg/m   Wt Readings from Last 3 Encounters:  11/02/22 260 lb 4.8 oz (118.1 kg)  10/13/22 260 lb 8 oz (118.2 kg)  10/09/22 265 lb (120.2 kg)    Physical Exam Vitals and nursing note reviewed.  Constitutional:      General: She is not in acute distress.    Appearance: Normal appearance. She is obese. She is not ill-appearing, toxic-appearing or diaphoretic.  HENT:     Head: Normocephalic.     Right Ear: External ear normal.     Left Ear: External ear normal.     Nose: Nose normal.     Mouth/Throat:     Mouth: Mucous membranes are moist.     Pharynx: Oropharynx is clear.  Eyes:     General:        Right eye: No discharge.        Left eye: No discharge.     Extraocular Movements: Extraocular movements intact.     Conjunctiva/sclera: Conjunctivae normal.     Pupils: Pupils are equal, round, and reactive to light.  Cardiovascular:     Rate and Rhythm: Normal rate and regular rhythm.     Heart sounds: No murmur heard. Pulmonary:     Effort: Pulmonary effort is normal. No respiratory distress.     Breath sounds: Normal breath sounds. No wheezing or rales.  Abdominal:     General: Abdomen is  flat. Bowel sounds are normal. There is no distension.     Palpations: Abdomen is soft.     Tenderness: There is no abdominal tenderness. There is no right CVA tenderness, left CVA tenderness or guarding.  Musculoskeletal:       Arms:     Cervical back: Normal range of motion and neck supple.     Thoracic back: Spasms and tenderness present. No swelling, edema, deformity, signs of trauma, lacerations or bony tenderness. Normal range of motion. No scoliosis.  Skin:    General: Skin is warm and dry.     Capillary Refill: Capillary refill takes less than 2 seconds.  Neurological:     General: No focal deficit present.     Mental Status: She is alert and oriented to person, place, and time. Mental status is at baseline.  Psychiatric:        Mood and Affect: Mood normal.         Behavior: Behavior normal.        Thought Content: Thought content normal.        Judgment: Judgment normal.     Results for orders placed or performed during the hospital encounter of 10/09/22  Urinalysis, Routine w reflex microscopic Urine, Clean Catch  Result Value Ref Range   Color, Urine YELLOW (A) YELLOW   APPearance CLOUDY (A) CLEAR   Specific Gravity, Urine 1.018 1.005 - 1.030   pH 5.0 5.0 - 8.0   Glucose, UA NEGATIVE NEGATIVE mg/dL   Hgb urine dipstick NEGATIVE NEGATIVE   Bilirubin Urine NEGATIVE NEGATIVE   Ketones, ur NEGATIVE NEGATIVE mg/dL   Protein, ur NEGATIVE NEGATIVE mg/dL   Nitrite POSITIVE (A) NEGATIVE   Leukocytes,Ua MODERATE (A) NEGATIVE   RBC / HPF 6-10 0 - 5 RBC/hpf   WBC, UA 21-50 0 - 5 WBC/hpf   Bacteria, UA RARE (A) NONE SEEN   Squamous Epithelial / LPF 11-20 0 - 5   Mucus PRESENT       Assessment & Plan:   Problem List Items Addressed This Visit       Other   Obesity - Primary    BMI 42.  Recommended eating smaller high protein, low fat meals more frequently and exercising 30 mins a day 5 times a week with a goal of 10-15lb weight loss in the next 3 months. Patient voiced their understanding and motivation to adhere to these recommendations.       Other Visit Diagnoses     Yeast infection       UA and wet prep are negative. Will treat with diflucan due to patient's symptoms of discharge and itching. Follow up if not improved.   Relevant Medications   fluconazole (DIFLUCAN) 150 MG tablet   Upper back pain       Will repeat dose of prednisone. Will obtain xray of thoracic spine.  Will likely need physical therapy for further treatment.   Relevant Medications   predniSONE (DELTASONE) 10 MG tablet   Other Relevant Orders   DG Thoracic Spine W/Swimmers   Vaginal itching       Relevant Orders   Urinalysis, Routine w reflex microscopic   WET PREP FOR TRICH, YEAST, CLUE        Follow up plan: Return if symptoms worsen or fail to  improve.

## 2022-11-02 NOTE — Progress Notes (Signed)
Results discussed with patient during visit.

## 2022-11-02 NOTE — Assessment & Plan Note (Signed)
BMI 42.  Recommended eating smaller high protein, low fat meals more frequently and exercising 30 mins a day 5 times a week with a goal of 10-15lb weight loss in the next 3 months. Patient voiced their understanding and motivation to adhere to these recommendations.

## 2022-11-03 NOTE — Progress Notes (Signed)
Hi Samantha Villegas.  Overall your xray was normal.  It does show some arthritis.  I recommend doing some physical therapy to help with the back pain.  If you agree, I will place the order.

## 2022-11-04 ENCOUNTER — Ambulatory Visit (INDEPENDENT_AMBULATORY_CARE_PROVIDER_SITE_OTHER): Payer: Medicaid Other | Admitting: Dermatology

## 2022-11-04 VITALS — BP 148/84

## 2022-11-04 DIAGNOSIS — L732 Hidradenitis suppurativa: Secondary | ICD-10-CM | POA: Diagnosis not present

## 2022-11-04 DIAGNOSIS — Z79899 Other long term (current) drug therapy: Secondary | ICD-10-CM | POA: Diagnosis not present

## 2022-11-04 MED ORDER — CLINDAMYCIN PHOSPHATE 1 % EX SOLN: CUTANEOUS | 5 refills | Status: DC

## 2022-11-04 NOTE — Progress Notes (Signed)
   Follow-Up Visit   Subjective  Samantha Villegas is a 31 y.o. female who presents for the following: Boils (Axilla, inframmary, groin. Patient has had outbreaks with boils for years, but worsened in the past 5 months. Areas are painful and drain when they come up, not as flared today. She has tried Hibiclens in the past, but didn't think it helped much. No other treatment.). No h/o inflammatory bowel disease.  She has spinal back pain, especially lower back.  The following portions of the chart were reviewed this encounter and updated as appropriate:       Review of Systems:  No other skin or systemic complaints except as noted in HPI or Assessment and Plan.  Objective  Well appearing patient in no apparent distress; mood and affect are within normal limits.  A focused examination was performed including face, axilla, inframammary. Relevant physical exam findings are noted in the Assessment and Plan.  inframammary, axilla, groin Nodules with open comedones and ropey scarring of the axilla, inframammary, medial thighs.    Assessment & Plan  Hidradenitis suppurativa inframammary, axilla, groin  Chronic and persistent condition with duration or expected duration over one year. Condition is symptomatic / bothersome to patient. Not to goal.  Hidradenitis Suppurativa is a chronic; persistent; non-curable, but treatable condition due to abnormal inflamed sweat glands in the body folds (axilla, inframammary, groin, medial thighs), causing recurrent painful draining cysts and scarring. It can be associated with severe scarring acne and cysts; also abscesses and scarring of scalp. The goal is control and prevention of flares, as it is not curable. Scars are permanent and can be thickened. Treatment may include daily use of topical medication and oral antibiotics.  Oral isotretinoin may also be helpful.  For more severe cases, Humira (a biologic injection) may be prescribed to decrease the  inflammatory process and prevent flares.  When indicated, inflamed cysts may also be treated surgically.  Pt had C. diff 3 yrs ago. Not able to take antibiotics for long-term use.  She has an upcoming appt with GI.  Reviewed risks of biologics including immunosuppression, infections, injection site reaction, and failure to improve condition. Goal is control of skin condition, not cure.  Some older biologics such as Humira and Enbrel may slightly increase risk of malignancy and may worsen congestive heart failure.  Talz and Cosentyx may cause inflammatory bowel disease to flare. The use of biologics requires long term medication management, including periodic office visits and monitoring of blood work.  Discussed Humira 80 MG injections- info given. Pending labs, will send in Rx. Prior labs show elevated sed rate and wbcs Start Clindamycin solution Apply to AA QD after shower dsp 60 mL 5Rf. Continue Hibiclens in the shower or Benzoyl Peroxide Wash or CLN wash.   clindamycin (CLEOCIN T) 1 % external solution - inframammary, axilla, groin Apply to affected areas once daily after shower.  Comprehensive metabolic panel - inframammary, axilla, groin  CBC with Differential/Platelet - inframammary, axilla, groin  Hepatitis B surface antibody,qualitative - inframammary, axilla, groin  Hepatitis B surface antigen - inframammary, axilla, groin  Hepatitis C antibody - inframammary, axilla, groin  QuantiFERON-TB Gold Plus - inframammary, axilla, groin   Return in about 1 month (around 12/04/2022) for HS.  ICherlyn Labella, CMA, am acting as scribe for Willeen Niece, MD .  Documentation: I have reviewed the above documentation for accuracy and completeness, and I agree with the above.  Willeen Niece MD

## 2022-11-04 NOTE — Patient Instructions (Addendum)
Hidradenitis Suppurativa is a chronic; persistent; non-curable, but treatable condition due to abnormal inflamed sweat glands in the body folds (axilla, inframammary, groin, medial thighs), causing recurrent painful draining cysts and scarring. It can be associated with severe scarring acne and cysts; also abscesses and scarring of scalp. The goal is control and prevention of flares, as it is not curable. Scars are permanent and can be thickened. Treatment may include daily use of topical medication and oral antibiotics.  Oral isotretinoin may also be helpful.  For more severe cases, Humira (a biologic injection) may be prescribed to decrease the inflammatory process and prevent flares.  When indicated, inflamed cysts may also be treated surgically.  Clindamycin solution - apply to affected areas once daily after shower.  Hibiclens or Benzoyl Peroxide wash in the shower. Benzoyl peroxide can cause dryness and irritation of the skin. It can also bleach fabric. When used together with Aczone (dapsone) cream, it can stain the skin orange.  Recommend OTC benzoyl peroxide cleanser, wash affected areas daily in shower, let sit several minutes prior to rinsing.  May bleach towels if not rinsed off completely.  Recommended brands include Panoxyl 4% Creamy Wash, CeraVe Acne Foaming Cream wash, or Cetaphil Gentle Clear Complexion-Clearing BPO Acne Cleanser.  Recommend [facility] Hypochlorous Spray (found in the wound care section) OR Cln brand Acne or Sports wash. The [facility] Hypochlorous Spray can be sprayed on daily and left on. The Cln wash should be applied to the affected area daily for at least 30 seconds and then rinsed off. If you are using clindamycin solution or lotion or another topical antibiotic to treat acne, using a hypochlorous product may help lower the risk of antibiotic resistant bacteria.     Reviewed risks of biologics including immunosuppression, infections, injection site reaction, and  failure to improve condition. Goal is control of skin condition, not cure.  Some older biologics such as Humira and Enbrel may slightly increase risk of malignancy and may worsen congestive heart failure.  Talz and Cosentyx may cause inflammatory bowel disease to flare. The use of biologics requires long term medication management, including periodic office visits and monitoring of blood work.   Due to recent changes in healthcare laws, you may see results of your pathology and/or laboratory studies on MyChart before the doctors have had a chance to review them. We understand that in some cases there may be results that are confusing or concerning to you. Please understand that not all results are received at the same time and often the doctors may need to interpret multiple results in order to provide you with the best plan of care or course of treatment. Therefore, we ask that you please give [place] 2 business days to thoroughly review all your results before contacting the office for clarification. Should we see a critical lab result, you will be contacted sooner.   If You Need Anything After Your Visit  If you have any questions or concerns for your doctor, please call our main line at [phone] and press option 4 to reach your doctor's medical assistant. If no one answers, please leave a voicemail as directed and we will return your call as soon as possible. Messages left after 4 pm will be answered the following business day.   You may also send [place] a message via MyChart. We typically respond to MyChart messages within 1-2 business days.  For prescription refills, please ask your pharmacy to contact our office. Our fax number is [phone].  If you  have an urgent issue when the clinic is closed that cannot wait until the next business day, you can page your doctor at the number below.    Please note that while we do our best to be available for urgent issues outside of office hours, we are not  available 24/7.   If you have an urgent issue and are unable to reach [place], you may choose to seek medical care at your doctor's office, retail clinic, urgent care center, or emergency room.  If you have a medical emergency, please immediately call 911 or go to the emergency department.  Pager Numbers  - Dr. Gwen Pounds: [phone]  - Dr. Neale Burly: [phone]  - Dr. Roseanne Reno: [phone]  In the event of inclement weather, please call our main line at [phone] for an update on the status of any delays or closures.  Dermatology Medication Tips: Please keep the boxes that topical medications come in in order to help keep track of the instructions about where and how to use these. Pharmacies typically print the medication instructions only on the boxes and not directly on the medication tubes.   If your medication is too expensive, please contact our office at [phone] option 4 or send [place] a message through MyChart.   We are unable to tell what your co-pay for medications will be in advance as this is different depending on your insurance coverage. However, we may be able to find a substitute medication at lower cost or fill out paperwork to get insurance to cover a needed medication.   If a prior authorization is required to get your medication covered by your insurance company, please allow [place] 1-2 business days to complete this process.  Drug prices often vary depending on where the prescription is filled and some pharmacies may offer cheaper prices.  The website www.goodrx.com contains coupons for medications through different pharmacies. The prices here do not account for what the cost may be with help from insurance (it may be cheaper with your insurance), but the website can give you the price if you did not use any insurance.  - You can print the associated coupon and take it with your prescription to the pharmacy.  - You may also stop by our office during regular business hours  and pick up a GoodRx coupon card.  - If you need your prescription sent electronically to a different pharmacy, notify our office through [facility] or by phone at [phone] option 4.     Si Usted Necesita Algo Despus de Su Visita  Tambin puede enviarnos un mensaje a travs de Clinical cytogeneticist. Por lo general respondemos a los mensajes de MyChart en el transcurso de 1 a 2 das hbiles.  Para renovar recetas, por favor pida a su farmacia que se ponga en contacto con nuestra oficina. Annie Sable de fax es [place] [phone].  Si tiene un asunto urgente cuando la clnica est cerrada y que no puede esperar hasta el siguiente da hbil, puede llamar/localizar a su doctor(a) al nmero que aparece a continuacin.   Por favor, tenga en cuenta que aunque hacemos todo lo posible para estar disponibles para asuntos urgentes fuera del horario de [place], no estamos disponibles las 24 horas del da, los 7 [address]  [address] de la [place].   Si tiene un problema urgente y no puede comunicarse con nosotros, puede optar por buscar atencin mdica  en el consultorio de su doctor(a), en una clnica privada, en un centro de atencin urgente o en una sala  de emergencias.  Si tiene Engineer, drilling, por favor llame inmediatamente al 911 o vaya a la sala de emergencias.  Nmeros de bper  - Dr. Gwen Pounds: [phone]  - Dra. Moye: [phone]  - Dra. Roseanne Reno: [phone]  En caso de inclemencias del [place], por favor llame a Lacy Duverney principal al [phone] para una actualizacin sobre el [place] de cualquier retraso o cierre.  Consejos para la medicacin en dermatologa: Por favor, guarde las cajas en las que vienen los medicamentos de uso tpico para ayudarle a seguir las instrucciones sobre dnde y cmo usarlos. Las farmacias generalmente imprimen las instrucciones del medicamento slo en las cajas y no directamente en los tubos del [place].   Si su medicamento es muy caro, por favor, pngase  en contacto con Rolm Gala llamando al [phone] y presione la opcin 4 o envenos un mensaje a travs de Clinical cytogeneticist.   No podemos decirle cul ser su copago por los medicamentos por adelantado ya que esto es diferente dependiendo de la cobertura de su seguro. Sin embargo, es posible que podamos encontrar un medicamento sustituto a Audiological scientist un formulario para que el seguro cubra el medicamento que se considera necesario.   Si se requiere una autorizacin previa para que su compaa de seguros [place] su medicamento, por favor permtanos de 1 a 2 das hbiles para completar [address]  Los precios de los medicamentos varan con frecuencia dependiendo del Environmental consultant de dnde se surte la receta y alguna farmacias pueden ofrecer precios ms baratos.  El sitio web www.goodrx.com tiene cupones para medicamentos de Health and safety inspector. Los precios aqu no tienen en cuenta lo que podra costar con la ayuda del seguro (puede ser ms barato con su seguro), pero el sitio web puede darle el precio si no utiliz Tourist information centre manager.  - Puede imprimir el cupn correspondiente y llevarlo con su receta a la farmacia.  - Tambin puede pasar por nuestra oficina durante el horario de atencin regular y Education officer, museum una tarjeta de cupones de GoodRx.  - Si necesita que su receta se enve electrnicamente a una farmacia diferente, informe a nuestra oficina a travs de MyChart de [place] o por telfono llamando al [phone] y presione la opcin 4.

## 2022-11-05 ENCOUNTER — Encounter: Payer: Medicaid Other | Admitting: Nurse Practitioner

## 2022-11-08 LAB — COMPREHENSIVE METABOLIC PANEL
ALT: 44 IU/L — ABNORMAL HIGH (ref 0–32)
AST: 14 IU/L (ref 0–40)
Albumin/Globulin Ratio: 1.4 (ref 1.2–2.2)
Albumin: 4.2 g/dL (ref 3.9–4.9)
Alkaline Phosphatase: 108 IU/L (ref 44–121)
BUN/Creatinine Ratio: 13 (ref 9–23)
BUN: 9 mg/dL (ref 6–20)
Bilirubin Total: 0.3 mg/dL (ref 0.0–1.2)
CO2: 19 mmol/L — ABNORMAL LOW (ref 20–29)
Calcium: 9.1 mg/dL (ref 8.7–10.2)
Chloride: 105 mmol/L (ref 96–106)
Creatinine, Ser: 0.69 mg/dL (ref 0.57–1.00)
Globulin, Total: 3 g/dL (ref 1.5–4.5)
Glucose: 100 mg/dL — ABNORMAL HIGH (ref 70–99)
Potassium: 3.7 mmol/L (ref 3.5–5.2)
Sodium: 139 mmol/L (ref 134–144)
Total Protein: 7.2 g/dL (ref 6.0–8.5)
eGFR: 117 mL/min/{1.73_m2} (ref >59)

## 2022-11-08 LAB — HEPATITIS B SURFACE ANTIBODY,QUALITATIVE: Hep B Surface Ab, Qual: NONREACTIVE

## 2022-11-08 LAB — QUANTIFERON-TB GOLD PLUS
QuantiFERON Mitogen Value: 5.52 IU/mL
QuantiFERON Nil Value: 0 IU/mL
QuantiFERON TB1 Ag Value: 0.01 IU/mL
QuantiFERON TB2 Ag Value: 0 IU/mL
QuantiFERON-TB Gold Plus: NEGATIVE

## 2022-11-08 LAB — HEPATITIS C ANTIBODY: Hep C Virus Ab: NONREACTIVE

## 2022-11-08 LAB — CBC WITH DIFFERENTIAL/PLATELET
Basophils Absolute: 0.1 10*3/uL (ref 0.0–0.2)
Basos: 1 %
EOS (ABSOLUTE): 0.2 10*3/uL (ref 0.0–0.4)
Eos: 1 %
Hematocrit: 41.3 % (ref 34.0–46.6)
Hemoglobin: 14.2 g/dL (ref 11.1–15.9)
Immature Grans (Abs): 0 10*3/uL (ref 0.0–0.1)
Immature Granulocytes: 0 %
Lymphocytes Absolute: 3.4 10*3/uL — ABNORMAL HIGH (ref 0.7–3.1)
Lymphs: 22 %
MCH: 29.5 pg (ref 26.6–33.0)
MCHC: 34.4 g/dL (ref 31.5–35.7)
MCV: 86 fL (ref 79–97)
Monocytes Absolute: 0.7 10*3/uL (ref 0.1–0.9)
Monocytes: 5 %
Neutrophils Absolute: 11.2 10*3/uL — ABNORMAL HIGH (ref 1.4–7.0)
Neutrophils: 71 %
Platelets: 324 10*3/uL (ref 150–450)
RBC: 4.82 x10E6/uL (ref 3.77–5.28)
RDW: 12.5 % (ref 11.7–15.4)
WBC: 15.7 10*3/uL — ABNORMAL HIGH (ref 3.4–10.8)

## 2022-11-08 LAB — HEPATITIS B SURFACE ANTIGEN: Hepatitis B Surface Ag: NEGATIVE

## 2022-11-09 ENCOUNTER — Telehealth: Payer: Self-pay

## 2022-11-09 MED ORDER — HUMIRA-CD/UC/HS STARTER 80 MG/0.8ML ~~LOC~~ AJKT: 160.0000 mg | AUTO-INJECTOR | SUBCUTANEOUS | 0 refills | Status: DC

## 2022-11-09 MED ORDER — HUMIRA (2 PEN) 80 MG/0.8ML ~~LOC~~ PNKT: 80.0000 mg | PEN_INJECTOR | SUBCUTANEOUS | 6 refills | Status: DC

## 2022-11-09 NOTE — Telephone Encounter (Signed)
-----   Message from Willeen Niece, MD sent at 11/09/2022 12:43 PM EST ----- Baseline labs ok, ALT liver enzyme slightly elevated, WBC somewhat elevated which can be seen with recent infection or inflammation, Hep B/C negative, TB neg.  Okay to send in Rx for Humira 80 mg qowk with loading dose for HS - please call patient

## 2022-11-09 NOTE — Telephone Encounter (Signed)
Advised pt of lab results.  Humira loading dose and maintenance dose with refills sent into [facility].

## 2022-12-03 ENCOUNTER — Ambulatory Visit: Payer: Medicaid Other | Admitting: Dermatology

## 2022-12-19 ENCOUNTER — Encounter: Payer: Self-pay | Admitting: Dermatology

## 2022-12-23 ENCOUNTER — Ambulatory Visit: Payer: Medicaid Other | Admitting: Dermatology

## 2023-01-13 ENCOUNTER — Other Ambulatory Visit: Payer: Self-pay | Admitting: Nurse Practitioner

## 2023-01-13 NOTE — Telephone Encounter (Signed)
Requested medication (s) are due for refill today -expired Rx  Requested medication (s) are on the active medication list -yes  Future visit scheduled -no  Last refill: 12/31/21 18g 3RF  Notes to clinic: expired Rx  Requested Prescriptions  Pending Prescriptions Disp Refills   VENTOLIN HFA 108 (90 Base) MCG/ACT inhaler [Pharmacy Med Name: VENTOLIN HFA 90 MCG INHALER] 18 each     Sig: TAKE 2 PUFFS BY MOUTH EVERY 6 HOURS AS NEEDED FOR WHEEZE OR SHORTNESS OF BREATH     Pulmonology:  Beta Agonists 2 Failed - 01/13/2023  4:43 AM      Failed - Last BP in normal range    BP Readings from Last 1 Encounters:  11/04/22 (!) 148/84         Passed - Last Heart Rate in normal range    Pulse Readings from Last 1 Encounters:  11/02/22 84         Passed - Valid encounter within last 12 months    Recent Outpatient Visits           2 months ago Class 3 severe obesity due to excess calories without serious comorbidity with body mass index (BMI) of 40.0 to 44.9 in adult [facility])   [place] Jon Billings, NP   3 months ago Class 3 severe obesity due to excess calories without serious comorbidity with body mass index (BMI) of 40.0 to 44.9 in adult [facility])   [place] Jon Billings, NP   3 months ago Hidradenitis suppurativa   [place] Jon Billings, NP   9 months ago Nicotine dependence, cigarettes, uncomplicated   [place] Jon Billings, NP   10 months ago Nicotine dependence, cigarettes, uncomplicated   [place] Jon Billings, NP       Future Appointments             In 2 weeks Vanga, Tally Due, MD [facility] [place]   In 2 months Brendolyn Patty, [place]               Requested Prescriptions  Pending Prescriptions Disp Refills   VENTOLIN HFA 108 (90 Base) MCG/ACT  inhaler [Pharmacy Med Name: VENTOLIN HFA 90 MCG INHALER] 18 each     Sig: TAKE 2 PUFFS BY MOUTH EVERY 6 HOURS AS NEEDED FOR WHEEZE OR SHORTNESS OF BREATH     Pulmonology:  Beta Agonists 2 Failed - 01/13/2023  4:43 AM      Failed - Last BP in normal range    BP Readings from Last 1 Encounters:  11/04/22 (!) 148/84         Passed - Last Heart Rate in normal range    Pulse Readings from Last 1 Encounters:  11/02/22 84         Passed - Valid encounter within last 12 months    Recent Outpatient Visits           2 months ago Class 3 severe obesity due to excess calories without serious comorbidity with body mass index (BMI) of 40.0 to 44.9 in adult [facility])   [place] Jon Billings, NP   3 months ago Class 3 severe obesity due to excess calories without serious comorbidity with body mass index (BMI) of 40.0 to 44.9 in adult [facility])   [place] Jon Billings, NP   3  months ago Hidradenitis suppurativa   [place], NP   9 months ago Nicotine dependence, cigarettes, uncomplicated   [place], NP   10 months ago Nicotine dependence, cigarettes, uncomplicated   [place] Jon Billings, NP       Future Appointments             In 2 weeks Vanga, Tally Due, MD [place] [place]   In 2 months Brendolyn Patty, [place]

## 2023-01-28 ENCOUNTER — Other Ambulatory Visit: Payer: Self-pay

## 2023-02-01 ENCOUNTER — Encounter: Payer: Self-pay | Admitting: Gastroenterology

## 2023-02-01 ENCOUNTER — Ambulatory Visit (INDEPENDENT_AMBULATORY_CARE_PROVIDER_SITE_OTHER): Payer: Medicaid Other | Admitting: Gastroenterology

## 2023-02-01 ENCOUNTER — Other Ambulatory Visit: Payer: Self-pay

## 2023-02-01 VITALS — BP 116/78 | HR 96 | Temp 99.0°F | Ht 66.0 in | Wt 257.5 lb

## 2023-02-01 DIAGNOSIS — H0263 Xanthelasma of right eye, unspecified eyelid: Secondary | ICD-10-CM

## 2023-02-01 DIAGNOSIS — R7401 Elevation of levels of liver transaminase levels: Secondary | ICD-10-CM

## 2023-02-01 DIAGNOSIS — K760 Fatty (change of) liver, not elsewhere classified: Secondary | ICD-10-CM | POA: Diagnosis not present

## 2023-02-01 DIAGNOSIS — R194 Change in bowel habit: Secondary | ICD-10-CM

## 2023-02-01 DIAGNOSIS — H0266 Xanthelasma of left eye, unspecified eyelid: Secondary | ICD-10-CM

## 2023-02-01 MED ORDER — NA SULFATE-K SULFATE-MG SULF 17.5-3.13-1.6 GM/177ML PO SOLN: 354.0000 mL | Freq: Once | ORAL | 0 refills | Status: AC

## 2023-02-01 NOTE — Progress Notes (Unsigned)
Cephas Darby, MD [address]  [place], [place] [address]  Main: [phone]  Fax: [phone]    Gastroenterology Consultation  Referring Provider:     Jon Billings, NP Primary Care Physician:  Jon Billings, NP Primary Gastroenterologist:  Dr. Cephas Darby Reason for Consultation: Recurrent C. Difficile diarrhea        HPI:   Samantha Villegas is a 32 y.o. female referred by Dr. Jon Billings, NP  for consultation & management of acute diarrhea. She reports that the last 2 weeks, she has been experiencing several episodes of nonbloody diarrhea. She reports that she had hamburger before the onset of symptoms. She was told that she may have Salmonella and was started on Cipro and Flagyl by her primary care provider. However, her symptoms got worse associated with lower abdominal cramps, nausea and went to the ER on 05/08/2018. She was told that she has UTI and was recommended to stop ciprofloxacin but continue Flagyl, she was also discharged on Keflex for UTI. She had GI pathogen panel done at that time and it was negative. However, C. Difficile was not checked. She was discharged on Imodium needed for the last 2 days she is taking Imodium up to 2 pills daily and reports that she has been having 1 or 2 bowel movements, soft in consistency and nonbloody. She reports mild nausea but otherwise denies vomiting. She reports mild right upper quadrant pain radiating to the back. She also had a CT yesterday protocol in the ER and showed mild enlarged right lobe of the liver. Otherwise normal gallbladder and normal CBD. She had possible thickening of the ascending colon.  She used to drink carbonated beverages and stopped about 3 days ago. She used to consume red meat regularly which she discontinued a few weeks ago. She denies fever, chills, weight loss Her labs including CBC, CMP, lipase was unremarkable in the ER She reports being asymptomatic without any GI  symptoms prior to this episode. She used to have 1 or 2 formed bowel movements daily  Follow-up visit 06/24/2018 She had recurrence of diarrhea, went to emergency room. She underwent stool studies which were negative for bacterial and viral pathogens. However, stool for C. Difficile was not checked. She was empirically discharged on Flagyl. Patient reports that she has been on Flagyl for at least 4 weeks to date. She continues to have ongoing nonbloody diarrhea, 4-5 episodes daily associated with mucus. She is taking Bentyl 10 mg as needed that helps with cramps only. Imodium helps temporarily. She is frustrated with ongoing diarrhea. Her weight has been stable. She reports avoiding red meat, avoiding sodas, sweet tea, artificial sweeteners. She does drink fruit juices but dilutes with water  Follow-up visit 08/30/2018 Patient had 2 episodes of C. Difficile colitis. Initially was treated with vancomycin x 2 weeks and first recurrence with fidaxomicin for 10 days. She found that she is [redacted] weeks pregnant. She continues to have intermittent episodes of loose stools, nonbloody, associated with abdominal cramps. Her appetite is good, denies nausea or vomiting, blood in stools, abdominal pain. Her bowel frequency fluctuates between 1 per day and 3-4 soft stools/day. She has not tried Bentyl  NSAIDs: none  Antiplts/Anticoagulants/Anti thrombotics: none  GI Procedures: none She denies family history of inflammatory bowel disease, GI malignancy  Past Medical History:  Diagnosis Date   Allergy    Anxiety    no meds   Asthma    h/o no inhalers  GERD (gastroesophageal reflux disease)    no meds   History of itching    Pyelonephritis    Skipped heart beats    pt states this happens occ but is asymptomatic    Past Surgical History:  Procedure Laterality Date   CESAREAN SECTION     LAPAROSCOPIC OVARIAN CYSTECTOMY Right 05/26/2018   Procedure: LAPAROSCOPIC RIGHT OVARIAN CYSTECTOMY;  Surgeon:  Homero Fellers, MD;  Location: [facility] ORS;  Service: Gynecology;  Laterality: Right;    Current Outpatient Medications:    albuterol (VENTOLIN HFA) 108 (90 Base) MCG/ACT inhaler, TAKE 2 PUFFS BY MOUTH EVERY 6 HOURS AS NEEDED FOR WHEEZE OR SHORTNESS OF BREATH, Disp: 18 each, Rfl: 3   Vitamin D, Ergocalciferol, (DRISDOL) 1.25 MG (50000 UNIT) CAPS capsule, Take 50,000 Units by mouth once a week., Disp: , Rfl:    Adalimumab (HUMIRA PEN) 80 MG/0.8ML PNKT, Inject 80 mg into the skin every 14 (fourteen) days. Starting on day 29. (Patient not taking: Reported on 02/01/2023), Disp: 2 each, Rfl: 6   Adalimumab (HUMIRA PEN-CD/UC/HS STARTER) 80 MG/0.8ML PNKT, Inject 160 mg into the skin as directed. Inject 160 mg subcutaneous on day 1, then 80 mg subcutaneous on day 15. (Patient not taking: Reported on 02/01/2023), Disp: 1 each, Rfl: 0   medroxyPROGESTERone (DEPO-PROVERA) 150 MG/ML injection, Inject 150 mg into the muscle every 3 (three) months. (Patient not taking: Reported on 02/01/2023), Disp: , Rfl:    Family History  Problem Relation Age of Onset   Arthritis Mother    Asthma Mother    Hyperlipidemia Mother    Hypertension Mother    Stroke Mother    Ulcers Mother    Sleep apnea Father    Drug abuse Brother    Early death Brother    Diabetes Paternal Aunt    Cancer Maternal Grandmother    Heart disease Paternal Grandmother      Social History   Tobacco Use   Smoking status: Every Day    Packs/day: 1.00    Years: 10.00    Total pack years: 10.00    Types: Cigarettes   Smokeless tobacco: Never  Vaping Use   Vaping Use: Former  Substance Use Topics   Alcohol use: Yes    Comment: social   Drug use: Not Currently    Types: Marijuana    Comment: Occassionally    Allergies as of 02/01/2023 - Review Complete 02/01/2023  Allergen Reaction Noted   Latex Other (See Comments) 05/19/2018    Review of Systems:    All systems reviewed and negative except where noted in HPI.   Physical  Exam:  BP 116/78 (BP Location: Right Arm, Patient Position: Sitting, Cuff Size: Large)   Pulse 96   Temp 99 F (37.2 C) (Oral)   Ht '5\' 6"'$  (1.676 m)   Wt 257 lb 8 oz (116.8 kg)   BMI 41.56 kg/m  No LMP recorded. Patient has had an injection.  General:   Alert, obese, Well-developed, well-nourished, pleasant and cooperative in NAD Head:  Normocephalic and atraumatic. Eyes:  Sclera clear, no icterus.   Conjunctiva pink. Ears:  Normal auditory acuity. Nose:  No deformity, discharge, or lesions. Mouth:  No deformity or lesions,oropharynx pink & moist. Neck:  Supple; no masses or thyromegaly. Lungs:  Respirations even and unlabored.  Clear throughout to auscultation.   No wheezes, crackles, or rhonchi. No acute distress. Heart:  Regular rate and rhythm; no murmurs, clicks, rubs, or gallops. Abdomen:  Normal bowel sounds. Soft,  obese, non-tender and non-distended, limited abdominal exam due to body habitus.  No guarding or rebound tenderness.   Rectal: Not performed Msk:  Symmetrical without gross deformities. Good, equal movement & strength bilaterally. Pulses:  Normal pulses noted. Extremities:  No clubbing or edema.  No cyanosis. Neurologic:  Alert and oriented x3;  grossly normal neurologically. Skin:  Intact without significant lesions or rashes. No jaundice. Lymph Nodes:  No significant cervical adenopathy. Psych:  Alert and cooperative. Normal mood and affect.  Imaging Studies: reviewed  Assessment and Plan:   Samantha Villegas is a 32 y.o. female with morbid obesity, with approximately 2 months history of nonbloody diarrhea, GI pathogen panel negative, recurrent C. Difficile infection, post treatment with 2 weeks course of vancomycin and 10 days course of fidaxomycin. CT with nonspecific thickening of ascending colon. Currently, she is experiencing intermittent loose stools associated with abdominal cramps.   - Recheck fecal calprotectin - Continue Bentyl as needed for  abdominal cramps - Trial of probiotics, VSL#3 - Start curcumin capsules 2pills twice daily - I have discussed with her about colonoscopy or flexible sigmoidoscopy during second trimester which is overall safe to undergo anesthesia if her diarrhea persists and fecal calprotectin is elevated   Follow up in 2 months  Cephas Darby, MD

## 2023-02-01 NOTE — Patient Instructions (Addendum)
Got you schedule for your RUQ ultrasound on 02/10/2023 [facility] mall at 8:00am for a 8:30am scan. Nothing to eat or drink after midnight. If you need to reschedule please call [phone] option 3 and then Option 2.  Please come for fasting labs. You can go to a [facility] drawing station or our office. our lab is open on Mondays 1pm to 4pm, Tuesdays 8:30am to 4pm. Wednesdays 8:30am to 4pm and thursdays 1pm to 4pm.   Fatty Liver Disease  The liver converts food into energy, removes toxic material from the blood, makes important proteins, and absorbs necessary vitamins from food. Fatty liver disease occurs when too much fat has built up in your liver cells. Fatty liver disease is also called hepatic steatosis. In many cases, fatty liver disease does not cause symptoms or problems. It is often diagnosed when tests are being done for other reasons. However, over time, fatty liver can cause inflammation that may lead to more serious liver problems, such as scarring of the liver (cirrhosis) and liver failure. Fatty liver is associated with insulin resistance, increased body fat, high blood pressure (hypertension), and high cholesterol. These are features of metabolic syndrome and increase your risk for stroke, diabetes, and heart disease. What are the causes? This condition may be caused by components of metabolic syndrome: Obesity. Insulin resistance. High cholesterol. Other causes: Alcohol abuse. Poor nutrition. Cushing syndrome. Pregnancy. Certain drugs. Poisons. Some viral infections. What increases the risk? You are more likely to develop this condition if you: Abuse alcohol. Are overweight. Have diabetes. Have hepatitis. Have a high triglyceride level. Are pregnant. What are the signs or symptoms? Fatty liver disease often does not cause symptoms. If symptoms do develop, they can include: Fatigue and weakness. Weight loss. Confusion. Nausea, vomiting, or abdominal  pain. Yellowing of your skin and the white parts of your eyes (jaundice). Itchy skin. How is this diagnosed? This condition may be diagnosed by: A physical exam and your medical history. Blood tests. Imaging tests, such as an ultrasound, CT scan, or MRI. A liver biopsy. A small sample of liver tissue is removed using a needle. The sample is then looked at under a microscope. How is this treated? Fatty liver disease is often caused by other health conditions. Treatment for fatty liver may involve medicines and lifestyle changes to manage conditions such as: Alcoholism. High cholesterol. Diabetes. Being overweight or obese. Follow these instructions at home:  Do not drink alcohol. If you have trouble quitting, ask your health care provider how to safely quit with the help of medicine or a supervised program. This is important to keep your condition from getting worse. Eat a healthy diet as told by your health care provider. Ask your health care provider about working with a dietitian to develop an eating plan. Exercise regularly. This can help you lose weight and control your cholesterol and diabetes. Talk to your health care provider about an exercise plan and which activities are best for you. Take over-the-counter and prescription medicines only as told by your health care provider. Keep all follow-up visits. This is important. Contact a health care provider if: You have trouble controlling your: Blood sugar. This is especially important if you have diabetes. Cholesterol. Drinking of alcohol. Get help right away if: You have abdominal pain. You have jaundice. You have nausea and are vomiting. You vomit blood or material that looks like coffee grounds. You have stools that are black, tar-like, or bloody. Summary Fatty liver disease develops when  too much fat builds up in the cells of your liver. Fatty liver disease often causes no symptoms or problems. However, over time, fatty  liver can cause inflammation that may lead to more serious liver problems, such as scarring of the liver (cirrhosis). You are more likely to develop this condition if you abuse alcohol, are pregnant, are overweight, have diabetes, have hepatitis, or have high triglyceride or cholesterol levels. Contact your health care provider if you have trouble controlling your blood sugar, cholesterol, or drinking of alcohol. This information is not intended to replace advice given to you by your health care provider. Make sure you discuss any questions you have with your health care provider. Document Revised: 09/05/2020 Document Reviewed: 09/05/2020 Elsevier Patient Education  [place].

## 2023-02-02 ENCOUNTER — Encounter: Payer: Self-pay | Admitting: Gastroenterology

## 2023-02-02 NOTE — Addendum Note (Signed)
Addended by: Cephas Darby on: 02/02/2023 05:08 PM   Modules accepted: Level of Service

## 2023-02-03 ENCOUNTER — Encounter: Payer: Self-pay | Admitting: Gastroenterology

## 2023-02-03 LAB — LIPID PANEL
Chol/HDL Ratio: 3.9 ratio (ref 0.0–4.4)
Cholesterol, Total: 202 mg/dL — ABNORMAL HIGH (ref 100–199)
HDL: 52 mg/dL (ref >39)
LDL Chol Calc (NIH): 125 mg/dL — ABNORMAL HIGH (ref 0–99)
Triglycerides: 143 mg/dL (ref 0–149)
VLDL Cholesterol Cal: 25 mg/dL (ref 5–40)

## 2023-02-03 LAB — HEPATIC FUNCTION PANEL
ALT: 33 IU/L — ABNORMAL HIGH (ref 0–32)
AST: 19 IU/L (ref 0–40)
Albumin: 4.2 g/dL (ref 3.9–4.9)
Alkaline Phosphatase: 82 IU/L (ref 44–121)
Bilirubin Total: 0.4 mg/dL (ref 0.0–1.2)
Bilirubin, Direct: 0.13 mg/dL (ref 0.00–0.40)
Total Protein: 7.1 g/dL (ref 6.0–8.5)

## 2023-02-04 ENCOUNTER — Encounter
Admission: RE | Disposition: A | Discharge status: Home / Self Care | Payer: Self-pay | Source: Home / Self Care | Attending: Gastroenterology

## 2023-02-04 ENCOUNTER — Ambulatory Visit: Payer: Medicaid Other | Admitting: Anesthesiology

## 2023-02-04 ENCOUNTER — Encounter: Payer: Self-pay | Admitting: Gastroenterology

## 2023-02-04 ENCOUNTER — Ambulatory Visit
Admission: RE | Admit: 2023-02-04 | Discharge: 2023-02-04 | Disposition: A | Discharge status: Home / Self Care | Payer: Medicaid Other | Attending: Gastroenterology | Admitting: Gastroenterology

## 2023-02-04 DIAGNOSIS — J45909 Unspecified asthma, uncomplicated: Secondary | ICD-10-CM | POA: Insufficient documentation

## 2023-02-04 DIAGNOSIS — K219 Gastro-esophageal reflux disease without esophagitis: Secondary | ICD-10-CM | POA: Diagnosis not present

## 2023-02-04 DIAGNOSIS — K529 Noninfective gastroenteritis and colitis, unspecified: Secondary | ICD-10-CM | POA: Diagnosis not present

## 2023-02-04 DIAGNOSIS — R194 Change in bowel habit: Secondary | ICD-10-CM | POA: Diagnosis present

## 2023-02-04 DIAGNOSIS — E669 Obesity, unspecified: Secondary | ICD-10-CM | POA: Diagnosis not present

## 2023-02-04 DIAGNOSIS — F17200 Nicotine dependence, unspecified, uncomplicated: Secondary | ICD-10-CM | POA: Diagnosis not present

## 2023-02-04 DIAGNOSIS — F419 Anxiety disorder, unspecified: Secondary | ICD-10-CM | POA: Insufficient documentation

## 2023-02-04 DIAGNOSIS — Z6839 Body mass index (BMI) 39.0-39.9, adult: Secondary | ICD-10-CM | POA: Insufficient documentation

## 2023-02-04 HISTORY — PX: COLONOSCOPY WITH PROPOFOL: SHX5780

## 2023-02-04 LAB — POCT PREGNANCY, URINE: Preg Test, Ur: NEGATIVE

## 2023-02-04 SURGERY — COLONOSCOPY WITH PROPOFOL
Anesthesia: General

## 2023-02-04 MED ORDER — PROPOFOL 10 MG/ML IV BOLUS
INTRAVENOUS | Status: DC | PRN
  Administered 2023-02-04: 80 mg via INTRAVENOUS

## 2023-02-04 MED ORDER — SODIUM CHLORIDE 0.9 % IV SOLN: INTRAVENOUS | Status: DC

## 2023-02-04 MED ORDER — LIDOCAINE HCL (CARDIAC) PF 100 MG/5ML IV SOSY
PREFILLED_SYRINGE | INTRAVENOUS | Status: DC | PRN
  Administered 2023-02-04: 60 mg via INTRAVENOUS

## 2023-02-04 MED ORDER — DEXMEDETOMIDINE HCL IN NACL 80 MCG/20ML IV SOLN
INTRAVENOUS | Status: DC | PRN
  Administered 2023-02-04: 8 ug via BUCCAL
  Administered 2023-02-04: 4 ug via BUCCAL

## 2023-02-04 MED ORDER — PROPOFOL 500 MG/50ML IV EMUL
INTRAVENOUS | Status: DC | PRN
  Administered 2023-02-04: 140 ug/kg/min via INTRAVENOUS

## 2023-02-04 NOTE — Op Note (Signed)
[facility] Gastroenterology Patient Name: Dinisha Weyrauch Procedure Date: 02/04/2023 10:39 AM MRN: [id number] Account #: [id number] Date of Birth: 12-14-1990 Admit Type: Outpatient Age: 32 Room: [facility] ENDO ROOM 3 Gender: Female Note Status: Finalized Instrument Name: Colonscope [id number] Procedure:             Colonoscopy Indications:           This is the patient's first colonoscopy, Change in                         bowel habits, Change in stool caliber, Clinically                         significant diarrhea of unexplained origin Providers:             Lin Landsman MD, MD Referring MD:          Jon Billings (Referring MD) Medicines:             General Anesthesia Complications:         No immediate complications. Estimated blood loss: None. Procedure:             Pre-Anesthesia Assessment:                        - Prior to the procedure, a History and Physical was                         performed, and patient medications and allergies were                         reviewed. The patient is competent. The risks and                         benefits of the procedure and the sedation options and                         risks were discussed with the patient. All questions                         were answered and informed consent was obtained.                         Patient identification and proposed procedure were                         verified by the physician, the nurse, the                         anesthesiologist, the anesthetist and the technician                         in the pre-procedure area in the procedure room in the                         endoscopy suite. Mental Status Examination: alert and                         oriented. Airway Examination: normal oropharyngeal  airway and neck mobility. Respiratory Examination:                         clear to auscultation. CV Examination: normal.                          Prophylactic Antibiotics: The patient does not require                         prophylactic antibiotics. Prior Anticoagulants: The                         patient has taken no anticoagulant or antiplatelet                         agents. ASA Grade Assessment: III - A patient with                         severe systemic disease. After reviewing the risks and                         benefits, the patient was deemed in satisfactory                         condition to undergo the procedure. The anesthesia                         plan was to use general anesthesia. Immediately prior                         to administration of medications, the patient was                         re-assessed for adequacy to receive sedatives. The                         heart rate, respiratory rate, oxygen saturations,                         blood pressure, adequacy of pulmonary ventilation, and                         response to care were monitored throughout the                         procedure. The physical status of the patient was                         re-assessed after the procedure.                        After obtaining informed consent, the colonoscope was                         passed under direct vision. Throughout the procedure,                         the patient's blood pressure, pulse, and oxygen  saturations were monitored continuously. The                         Colonoscope was introduced through the anus and                         advanced to the 10 cm into the ileum. The colonoscopy                         was performed without difficulty. The patient                         tolerated the procedure well. The quality of the bowel                         preparation was evaluated using the BBPS [facility] Bowel                         Preparation Scale) with scores of: Right Colon = 3,                         Transverse Colon = 3 and Left Colon = 3 (entire mucosa                          seen well with no residual staining, small fragments                         of stool or opaque liquid). The total BBPS score                         equals 9. The terminal ileum, ileocecal valve,                         appendiceal orifice, and rectum were photographed. Findings:      The perianal and digital rectal examinations were normal. Pertinent       negatives include normal sphincter tone and no palpable rectal lesions.      Patchy mild inflammation characterized by congestion (edema), erosions       and erythema was found in the terminal ileum. Biopsies were taken with a       cold forceps for histology.      Normal mucosa was found in the left colon and in the right colon.       Biopsies were taken with a cold forceps for histology.      The retroflexed view of the distal rectum and anal verge was normal and       showed no anal or rectal abnormalities. Impression:            - Mild inflammation was found in the ileum secondary                         to ileitis. Biopsied.                        - Normal mucosa in the left colon and in the right  colon. Biopsied.                        - The distal rectum and anal verge are normal on                         retroflexion view. Recommendation:        - Discharge patient to home (with escort).                        - Resume previous diet today.                        - Continue present medications.                        - Await pathology results.                        - Return to my office as previously scheduled. Procedure Code(s):     --- Professional ---                        [phone], Colonoscopy, flexible; with biopsy, single or                         multiple Diagnosis Code(s):     --- Professional ---                        K52.9, Noninfective gastroenteritis and colitis,                         unspecified                        R19.4, Change in bowel habit                         R19.5, Other fecal abnormalities                        R19.7, Diarrhea, unspecified CPT copyright 2022 American Medical Association. All rights reserved. The codes documented in this report are preliminary and upon coder review may  be revised to meet current compliance requirements. Dr. Ulyess Mort Lin Landsman MD, MD 02/04/2023 11:16:54 AM This report has been signed electronically. Number of Addenda: 0 Note Initiated On: 02/04/2023 10:39 AM Scope Withdrawal Time: 0 hours 12 minutes 47 seconds  Total Procedure Duration: 0 hours 14 minutes 56 seconds  Estimated Blood Loss:  Estimated blood loss: none.      [facility]

## 2023-02-04 NOTE — Anesthesia Postprocedure Evaluation (Signed)
Anesthesia Post Note  Patient: Sarahelizabeth Cange  Procedure(s) Performed: COLONOSCOPY WITH PROPOFOL  Patient location during evaluation: PACU Anesthesia Type: General Level of consciousness: awake and alert, oriented and patient cooperative Pain management: pain level controlled Vital Signs Assessment: post-procedure vital signs reviewed and stable Respiratory status: spontaneous breathing, nonlabored ventilation and respiratory function stable Cardiovascular status: blood pressure returned to baseline and stable Postop Assessment: adequate PO intake Anesthetic complications: no   No notable events documented.   Last Vitals:  Vitals:   02/04/23 1140 02/04/23 1150  BP: 101/79 106/73  Pulse: 65 63  Resp: 15 15  Temp:    SpO2: 100% 99%    Last Pain:  Vitals:   02/04/23 1140  TempSrc:   PainSc: 0-No pain                 Darrin Nipper

## 2023-02-04 NOTE — H&P (Signed)
Cephas Darby, MD [address]  [place], [place] [address]  Main: [phone]  Fax: [phone] Pager: [phone]  Primary Care Physician:  Jon Billings, NP Primary Gastroenterologist:  Dr. Cephas Darby  Pre-Procedure History & Physical: HPI:  Samantha Villegas is a 32 y.o. female is here for an colonoscopy.   Past Medical History:  Diagnosis Date   Allergy    Anxiety    no meds   Asthma    h/o no inhalers   GERD (gastroesophageal reflux disease)    no meds   History of itching    Pyelonephritis    Skipped heart beats    pt states this happens occ but is asymptomatic    Past Surgical History:  Procedure Laterality Date   CESAREAN SECTION     LAPAROSCOPIC OVARIAN CYSTECTOMY Right 05/26/2018   Procedure: LAPAROSCOPIC RIGHT OVARIAN CYSTECTOMY;  Surgeon: Homero Fellers, MD;  Location: [facility] ORS;  Service: Gynecology;  Laterality: Right;    Prior to Admission medications   Medication Sig Start Date End Date Taking? Authorizing Provider  albuterol (VENTOLIN HFA) 108 (90 Base) MCG/ACT inhaler TAKE 2 PUFFS BY MOUTH EVERY 6 HOURS AS NEEDED FOR WHEEZE OR SHORTNESS OF BREATH 01/14/23  Yes Jon Billings, NP  Vitamin D, Ergocalciferol, (DRISDOL) 1.25 MG (50000 UNIT) CAPS capsule Take 50,000 Units by mouth once a week. 12/30/22  Yes [provider]  Adalimumab (HUMIRA PEN) 80 MG/0.8ML PNKT Inject 80 mg into the skin every 14 (fourteen) days. Starting on day 29. Patient not taking: Reported on 02/01/2023 11/09/22   Brendolyn Patty, MD  Adalimumab [facility] PEN-CD/UC/HS STARTER) 80 MG/0.8ML PNKT Inject 160 mg into the skin as directed. Inject 160 mg subcutaneous on day 1, then 80 mg subcutaneous on day 15. Patient not taking: Reported on 02/01/2023 11/09/22   Brendolyn Patty, MD  medroxyPROGESTERone (DEPO-PROVERA) 150 MG/ML injection Inject 150 mg into the muscle every 3 (three) months. Patient not taking: Reported on 02/01/2023    [provider]    Allergies as of 02/02/2023 - Review Complete 02/01/2023  Allergen Reaction Noted   Latex Other (See Comments) 05/19/2018    Family History  Problem Relation Age of Onset   Arthritis Mother    Asthma Mother    Hyperlipidemia Mother    Hypertension Mother    Stroke Mother    Ulcers Mother    Sleep apnea Father    Drug abuse Brother    Early death Brother    Diabetes Paternal Aunt    Cancer Maternal Grandmother    Heart disease Paternal Grandmother     Social History   Socioeconomic History   Marital status: Single    Spouse name: Not on file   Number of children: Not on file   Years of education: Not on file   Highest education level: Not on file  Occupational History   Not on file  Tobacco Use   Smoking status: Every Day    Packs/day: 1.00    Years: 10.00    Total pack years: 10.00    Types: Cigarettes   Smokeless tobacco: Never  Vaping Use   Vaping Use: Former  Substance and Sexual Activity   Alcohol use: Yes    Comment: social   Drug use: Not Currently    Types: Marijuana    Comment: Occassionally   Sexual activity: Yes    Birth control/protection: Injection  Other Topics Concern   Not on file  Social History  Narrative   Not on file   Social Determinants of Health   Financial Resource Strain: Not on file  Food Insecurity: Not on file  Transportation Needs: Not on file  Physical Activity: Insufficiently Active (05/13/2018)   Exercise Vital Sign    Days of Exercise per Week: 1 day    Minutes of Exercise per Session: 30 min  Stress: Stress Concern Present (05/13/2018)   [place]    Feeling of Stress : Rather much  Social Connections: Not on file  Intimate Partner Violence: Not on file    Review of Systems: See HPI, otherwise negative ROS  Physical Exam: BP 114/66   Pulse 82   Temp 97.8 F (36.6 C) (Temporal)   Resp 16   Ht '5\' 6"'$  (1.676 m)   Wt 111.9 kg    SpO2 99%   BMI 39.83 kg/m  General:   Alert,  pleasant and cooperative in NAD Head:  Normocephalic and atraumatic. Neck:  Supple; no masses or thyromegaly. Lungs:  Clear throughout to auscultation.    Heart:  Regular rate and rhythm. Abdomen:  Soft, nontender and nondistended. Normal bowel sounds, without guarding, and without rebound.   Neurologic:  Alert and  oriented x4;  grossly normal neurologically.  Impression/Plan: Samantha Villegas is here for an colonoscopy to be performed for change in bowel habits  Risks, benefits, limitations, and alternatives regarding  colonoscopy have been reviewed with the patient.  Questions have been answered.  All parties agreeable.   Sherri Sear, MD  02/04/2023, 10:52 AM

## 2023-02-04 NOTE — Anesthesia Preprocedure Evaluation (Addendum)
Anesthesia Evaluation  Patient identified by MRN, date of birth, ID band Patient awake    Reviewed: Allergy & Precautions, NPO status , Patient's Chart, lab work & pertinent test results  History of Anesthesia Complications Negative for: history of anesthetic complications  Airway Mallampati: I   Neck ROM: Full    Dental no notable dental hx.    Pulmonary asthma , Current Smoker (5 black & milds daily) and Patient abstained from smoking.   Pulmonary exam normal breath sounds clear to auscultation       Cardiovascular Exercise Tolerance: Good negative cardio ROS Normal cardiovascular exam Rhythm:Regular Rate:Normal  Echo 12/12/18:   Technically difficult study due to chest wall/lung interference   Normal left ventricular systolic function, ejection fraction 60 to 65%   Normal right ventricular systolic function   No significant valvular abnormalities     Neuro/Psych  PSYCHIATRIC DISORDERS Anxiety     negative neurological ROS     GI/Hepatic ,GERD  ,,  Endo/Other  Obesity   Renal/GU negative Renal ROS     Musculoskeletal   Abdominal   Peds  Hematology negative hematology ROS (+)   Anesthesia Other Findings   Reproductive/Obstetrics                             Anesthesia Physical Anesthesia Plan  ASA: 2  Anesthesia Plan: General   Post-op Pain Management:    Induction: Intravenous  PONV Risk Score and Plan: 2 and Propofol infusion, TIVA and Treatment may vary due to age or medical condition  Airway Management Planned: Natural Airway  Additional Equipment:   Intra-op Plan:   Post-operative Plan:   Informed Consent: I have reviewed the patients History and Physical, chart, labs and discussed the procedure including the risks, benefits and alternatives for the proposed anesthesia with the patient or authorized representative who has indicated his/her understanding and  acceptance.       Plan Discussed with: CRNA  Anesthesia Plan Comments: (LMA/GETA backup discussed.  Patient consented for risks of anesthesia including but not limited to:  - adverse reactions to medications - damage to eyes, teeth, lips or other oral mucosa - nerve damage due to positioning  - sore throat or hoarseness - damage to heart, brain, nerves, lungs, other parts of body or loss of life  Informed patient about role of CRNA in peri- and intra-operative care.  Patient voiced understanding.)        Anesthesia Quick Evaluation

## 2023-02-04 NOTE — Telephone Encounter (Signed)
Patient procedure is today

## 2023-02-04 NOTE — Transfer of Care (Signed)
Immediate Anesthesia Transfer of Care Note  Patient: Samantha Villegas  Procedure(s) Performed: COLONOSCOPY WITH PROPOFOL  Patient Location: PACU  Anesthesia Type:General  Level of Consciousness: awake, alert , and oriented  Airway & Oxygen Therapy: Patient Spontanous Breathing  Post-op Assessment: Report given to RN and Post -op Vital signs reviewed and stable  Post vital signs: Reviewed and stable  Last Vitals:  Vitals Value Taken Time  BP    Temp    Pulse 79 02/04/23 1118  Resp 23 02/04/23 1118  SpO2 95 % 02/04/23 1118  Vitals shown include unvalidated device data.  Last Pain:  Vitals:   02/04/23 0936  TempSrc: Temporal  PainSc: 0-No pain         Complications: No notable events documented.

## 2023-02-05 ENCOUNTER — Encounter: Payer: Self-pay | Admitting: Gastroenterology

## 2023-02-05 LAB — SURGICAL PATHOLOGY

## 2023-02-06 ENCOUNTER — Encounter: Payer: Self-pay | Admitting: Gastroenterology

## 2023-02-10 ENCOUNTER — Ambulatory Visit
Admission: RE | Admit: 2023-02-10 | Discharge: 2023-02-10 | Disposition: A | Discharge status: Home / Self Care | Payer: Medicaid Other | Source: Ambulatory Visit | Attending: Gastroenterology | Admitting: Gastroenterology

## 2023-02-10 DIAGNOSIS — K760 Fatty (change of) liver, not elsewhere classified: Secondary | ICD-10-CM | POA: Diagnosis not present

## 2023-03-22 ENCOUNTER — Ambulatory Visit: Payer: Medicaid Other | Admitting: Dermatology

## 2023-03-30 ENCOUNTER — Encounter: Payer: Self-pay | Admitting: Nurse Practitioner

## 2023-03-30 ENCOUNTER — Ambulatory Visit: Payer: Medicaid Other | Admitting: Nurse Practitioner

## 2023-03-30 VITALS — BP 110/80 | HR 78 | Temp 98.3°F | Ht 65.98 in | Wt 247.0 lb

## 2023-03-30 DIAGNOSIS — K047 Periapical abscess without sinus: Secondary | ICD-10-CM | POA: Insufficient documentation

## 2023-03-30 MED ORDER — AMOXICILLIN-POT CLAVULANATE 875-125 MG PO TABS: 1.0000 | ORAL_TABLET | Freq: Two times a day (BID) | ORAL | 0 refills | Status: AC

## 2023-03-30 MED ORDER — HYDROCODONE-ACETAMINOPHEN 5-325 MG PO TABS: 1.0000 | ORAL_TABLET | Freq: Four times a day (QID) | ORAL | 0 refills | Status: AC | PRN

## 2023-03-30 NOTE — Patient Instructions (Signed)
Dental Pain Dental pain is often a sign that something is wrong with your teeth or gums. You can also have pain after a dental treatment. If you have dental pain, it is important to contact your dentist, especially if the cause of the pain is not known. Dental pain may hurt a lot or a little and can be caused by many things, including: Tooth decay (cavities or caries). Infection. The inner part of the tooth being filled with pus (an abscess). Injury. A crack in the tooth. Gums that move back and expose the root of a tooth. Gum disease. Abnormal grinding or clenching of teeth. Not taking good care of your teeth. Sometimes the cause of pain is not known. You may have pain all the time, or it may happen only when you are: Chewing. Exposed to hot or cold temperatures. Eating or drinking foods or drinks that have a lot of sugar in them, such as soda or candy. Follow these instructions at home: Medicines Take over-the-counter and prescription medicines only as told by your dentist. If you were prescribed an antibiotic medicine, take it as told by your dentist. Do not stop taking it even if you start to feel better. Eating and drinking Do not eat foods or drinks that cause you pain. These include: Very hot or very cold foods or drinks. Sweet or sugary foods or drinks. Managing pain and swelling  If told, put ice on the painful area of your face. To do this: Put ice in a plastic bag. Place a towel between your skin and the bag. Leave the ice on for 20 minutes, 2-3 times a day. Take off the ice if your skin turns bright red. This is very important. If you cannot feel pain, heat, or cold, you have a greater risk of damage to the area. Brushing your teeth Brush your teeth twice a day using a fluoride toothpaste. Use a toothpaste made for sensitive teeth as told by your dentist. Use a soft toothbrush. General instructions Floss your teeth at least once a day. Do not put heat on the outside  of your face. Rinse your mouth often with salt water. To make salt water, dissolve -1 tsp (3-6 g) of salt in 1 cup (237 mL) of warm water. Watch your dental pain. Let your dentist know if there are any changes. Keep all follow-up visits. Contact a dentist if: You have dental pain and you do not know why. Medicine does not help your pain. Your symptoms get worse. You have new symptoms. Get help right away if: You cannot open your mouth. You are having trouble breathing or swallowing. You have a fever. Your face, neck, or jaw is swollen. These symptoms may be an emergency. Get help right away. Call your local emergency services (911 in the [place] Do not wait to see if the symptoms will go away. Do not drive yourself to the hospital. Summary Dental pain may be caused by many things, including tooth decay, injury, or infection. In some cases, the cause is not known. Dental pain may hurt a lot or very little. You may have pain all the time, or you may have it only when you eat or drink. Take over-the-counter and prescription medicines only as told by your dentist. Watch your dental pain for any changes. Let your dentist know if symptoms get worse. This information is not intended to replace advice given to you by your health care provider. Make sure you discuss any questions you have with   your health care provider. Document Revised: 08/28/2020 Document Reviewed: 08/28/2020 Elsevier Patient Education  2023 Elsevier Inc.  

## 2023-03-30 NOTE — Progress Notes (Signed)
BP 110/80   Pulse 78   Temp 98.3 F (36.8 C)   Ht 5' 5.98" (1.676 m)   Wt 247 lb (112 kg)   SpO2 98%   BMI 39.89 kg/m    Subjective:    Patient ID: Samantha Villegas, female    DOB: 1991/08/22, 32 y.o.   MRN: [id number]  HPI: Samantha Villegas is a 32 y.o. female  Chief Complaint  Patient presents with   Dental Pain    Started yesterday, left side of face    DENTAL PAIN Started yesterday evening.  Has hole in left bottom wisdom tooth that needs to be pulled, going to see dentist May 15th -- [facility] in [place]. Duration: 24 hours Involved teeth: left and lower Dentist evaluation: yes Mechanism of injury:  no trauma Onset: sudden Severity: 6-7/10 Quality: aching and throbbing Frequency: intermittent Radiation: none Aggravating factors: chewing Alleviating factors: nothing Status: fluctuating Treatments attempted: NSAIDs Relief with NSAIDs?: moderate Fevers: no -- Saturday had chills Swelling: no Redness: no Paresthesias / decreased sensation: no Sinus pressure: yes   Relevant past medical, surgical, family and social history reviewed and updated as indicated. Interim medical history since our last visit reviewed. Allergies and medications reviewed and updated.  Review of Systems  Constitutional:  Negative for activity change, appetite change, diaphoresis, fatigue and fever.  HENT:  Positive for dental problem.   Respiratory:  Negative for cough, chest tightness and shortness of breath.   Cardiovascular:  Negative for chest pain, palpitations and leg swelling.  Neurological: Negative.   Psychiatric/Behavioral: Negative.      Per HPI unless specifically indicated above     Objective:    BP 110/80   Pulse 78   Temp 98.3 F (36.8 C)   Ht 5' 5.98" (1.676 m)   Wt 247 lb (112 kg)   SpO2 98%   BMI 39.89 kg/m   Wt Readings from Last 3 Encounters:  03/30/23 247 lb (112 kg)  02/04/23 246 lb 12.8 oz (111.9 kg)  02/01/23 257 lb 8 oz (116.8 kg)     Physical Exam Vitals and nursing note reviewed.  Constitutional:      General: She is awake. She is not in acute distress.    Appearance: She is well-developed and well-groomed. She is obese. She is not ill-appearing or toxic-appearing.  HENT:     Head: Normocephalic.     Right Ear: Hearing and external ear normal.     Left Ear: Hearing and external ear normal.     Nose: Nose normal.     Right Sinus: No maxillary sinus tenderness or frontal sinus tenderness.     Left Sinus: No maxillary sinus tenderness or frontal sinus tenderness.     Mouth/Throat:     Mouth: Mucous membranes are moist.     Dentition: Dental caries present.     Pharynx: No pharyngeal swelling, oropharyngeal exudate or posterior oropharyngeal erythema.   Eyes:     General: Lids are normal.        Right eye: No discharge.        Left eye: No discharge.     Conjunctiva/sclera: Conjunctivae normal.     Pupils: Pupils are equal, round, and reactive to light.  Neck:     Thyroid: No thyromegaly.     Vascular: No carotid bruit or JVD.  Cardiovascular:     Rate and Rhythm: Normal rate and regular rhythm.     Heart sounds: Normal heart sounds. No murmur heard.  No gallop.  Pulmonary:     Effort: Pulmonary effort is normal.     Breath sounds: Normal breath sounds.  Abdominal:     General: Bowel sounds are normal.     Palpations: Abdomen is soft.  Musculoskeletal:     Cervical back: Normal range of motion and neck supple.     Right lower leg: No edema.     Left lower leg: No edema.  Lymphadenopathy:     Cervical: No cervical adenopathy.  Skin:    General: Skin is warm and dry.  Neurological:     Mental Status: She is alert and oriented to person, place, and time.  Psychiatric:        Attention and Perception: Attention normal.        Mood and Affect: Mood normal.        Behavior: Behavior normal. Behavior is cooperative.        Thought Content: Thought content normal.        Judgment: Judgment normal.     Results for orders placed or performed during the hospital encounter of 02/04/23  Pregnancy, urine POC  Result Value Ref Range   Preg Test, Ur NEGATIVE NEGATIVE  Surgical pathology  Result Value Ref Range   SURGICAL PATHOLOGY      SURGICAL PATHOLOGY CASE: [phone] PATIENT: [facility] Surgical Pathology Report     Specimen Submitted: A. Terminal ileum; cbx B. Colon, right; cbx C. Colon, left; cbx  Clinical History: Change in bowel habits R19.4.  Normal colonoscopy, abnormal mucosa in terminal ileum.      DIAGNOSIS: A.  TERMINAL ILEUM; COLD BIOPSY: - SMALL INTESTINAL MUCOSA WITH REACTIVE LYMPHOID AGGREGATES AND FOCAL OVERLYING MILD ACTIVE ENTERITIS (APHTHOID LESION), NON-SPECIFIC. - NEGATIVE FOR GRANULOMAS, FEATURES OF CHRONICITY, DYSPLASIA, AND MALIGNANCY.  B.  COLON, RIGHT; COLD BIOPSY: - COLONIC MUCOSA WITH FOCAL LYMPHOID AGGREGATES, NON-SPECIFIC. - NEGATIVE FOR GRANULOMAS, FEATURES OF CHRONICITY, DYSPLASIA, AND MALIGNANCY.  C.  COLON, LEFT; COLD BIOPSY: - COLONIC MUCOSA WITH FOCAL LYMPHOID AGGREGATES, NON-SPECIFIC. - NEGATIVE FOR GRANULOMAS, FEATURES OF CHRONICITY, DYSPLASIA, AND MALIGNANCY.  GROSS DESCRIPTION: A. Labeled: cbx terminal ileum for abnormal mucosa Re ceived: Formalin Collection time: 11:02 AM on 02/04/2023 Placed into formalin time: 11:02 AM on 02/04/2023 Tissue fragment(s): 4 Size: Aggregate, 0.8 x 0.6 x 0.1 cm Description: Tan soft tissue fragments Entirely submitted in 1 cassette.  B. Labeled: cbx right colon for intermittent diarrhea rule out colitis Received: Formalin Collection time: 11:05 AM on 02/04/2023 Placed into formalin time: 11:05 AM on 02/04/2023 Tissue fragment(s): Multiple Size: Aggregate, 1.9 x 0.4 x 0.1 cm Description: Tan soft tissue fragments Entirely submitted in 1 cassette.  C. Labeled: cbx left colon for intermittent diarrhea rule out colitis Received: Formalin Collection time: 11:10 AM on  02/04/2023 Placed into formalin time: 11:10 AM on 02/04/2023 Tissue fragment(s): Multiple Size: Aggregate, 1.7 x 0.5 x 0.1 cm Description: Red-tan soft tissue fragments Entirely submitted in 1 cassette.  RB 02/04/2023   Final Diagnosis performed by Elijah Birk, MD.   Electronically signed 02/05/2023 1 2:36:44PM The electronic signature indicates that the named Attending Pathologist has evaluated the specimen Technical component performed at [facility], [address] [place], [place] [address] Lab: [phone] Dir:  Schimke, MD, MMM  Professional component performed at [facility], [facility], [address] [place], [place], [place] [address] Lab: [phone] Dir: Beryle Quant, MD       Assessment & Plan:   Problem List Items Addressed This Visit  Digestive   Dental abscess - Primary    To left lower wisdom tooth area with pain.  Scheduled to see dental and have repaired on May 15th, [facility] in [place].  At this time will start Augmentin BID for 7 days + short course of Norco for pain (5 days).  Educated patient on this and regimen.  Recommend salt water gargles and use of Oragel at home as needed.          Follow up plan: Return if symptoms worsen or fail to improve.

## 2023-03-30 NOTE — Assessment & Plan Note (Signed)
To left lower wisdom tooth area with pain.  Scheduled to see dental and have repaired on May 15th, [facility] in [place].  At this time will start Augmentin BID for 7 days + short course of Norco for pain (5 days).  Educated patient on this and regimen.  Recommend salt water gargles and use of Oragel at home as needed.

## 2023-04-12 NOTE — Progress Notes (Unsigned)
There were no vitals taken for this visit.   Subjective:    Patient ID: Samantha Villegas, female    DOB: 1991-11-02, 32 y.o.   MRN: [id number]  HPI: Samantha Villegas is a 32 y.o. female  No chief complaint on file.  ALLERGIES Duration: {Blank single:19197::"chronic","days","weeks","months"} Runny nose: {Blank single:19197::"yes","no"} {Blank single:19197::""clear","green", "yellow"} Nasal congestion: {Blank single:19197::"yes","no"} Nasal itching: {Blank single:19197::"yes","no"} Sneezing: {Blank single:19197::"yes","no"} Eye swelling, itching or discharge: {Blank single:19197::"yes","no"} Post nasal drip: {Blank single:19197::"yes","no"} Cough: {Blank single:19197::"yes","no","yes, productive","yes, non-productive"} Sinus pressure: {Blank single:19197::"yes","no"}  Ear pain: {Blank single:19197::"yes","no"} {Blank single:19197::""right","left", "bilateral"} Ear pressure: {Blank single:19197::"yes","no"} {Blank single:19197::""right","left", "bilateral"} Fever: {Blank single:19197::"yes","no"} {Blank single:19197::"subjective","low grade"} Symptoms occur seasonally: {Blank single:19197::"yes","no"} Symptoms occur perenially: {Blank single:19197::"yes","no"} Satisfied with current treatment: {Blank single:19197::"yes","no"} Allergist evaluation in past: {Blank single:19197::"yes","no"} Allergen injection immunotherapy: {Blank single:19197::"yes","no"} Recurrent sinus infections: {Blank single:19197::"yes","no"} ENT evaluation in past: {Blank single:19197::"yes","no"} Known environmental allergy: {Blank single:19197::"yes","no"} Indoor pets: {Blank single:19197::"yes","no"} History of asthma: {Blank single:19197::"yes","no"} Current allergy medications: {Blank multiple:19196::"none","allegra","allegra D","astelin","astepro","benadryl","claritin","claritin D","flonase","nasacort","nasonex","rhinocort","singulair","veramyst","zyrtec","zyrtec D","xyzal"} Treatments  attempted: {Blank multiple:19196::"none","no previous nasal corticosteroids","allegra","allegra D","astelin","astepro","benadryl","claritin","claritin D","flonase","nasacort","nasonex","rhinocort","singulair","veramyst","zyrtec","zyrtec D","xyzal"}  Relevant past medical, surgical, family and social history reviewed and updated as indicated. Interim medical history since our last visit reviewed. Allergies and medications reviewed and updated.  Review of Systems  Per HPI unless specifically indicated above     Objective:    There were no vitals taken for this visit.  Wt Readings from Last 3 Encounters:  03/30/23 247 lb (112 kg)  02/04/23 246 lb 12.8 oz (111.9 kg)  02/01/23 257 lb 8 oz (116.8 kg)    Physical Exam  Results for orders placed or performed during the hospital encounter of 02/04/23  Pregnancy, urine POC  Result Value Ref Range   Preg Test, Ur NEGATIVE NEGATIVE  Surgical pathology  Result Value Ref Range   SURGICAL PATHOLOGY      SURGICAL PATHOLOGY CASE: [id number] PATIENT: [facility] Surgical Pathology Report     Specimen Submitted: A. Terminal ileum; cbx B. Colon, right; cbx C. Colon, left; cbx  Clinical History: Change in bowel habits R19.4.  Normal colonoscopy, abnormal mucosa in terminal ileum.      DIAGNOSIS: A.  TERMINAL ILEUM; COLD BIOPSY: - SMALL INTESTINAL MUCOSA WITH REACTIVE LYMPHOID AGGREGATES AND FOCAL OVERLYING MILD ACTIVE ENTERITIS (APHTHOID LESION), NON-SPECIFIC. - NEGATIVE FOR GRANULOMAS, FEATURES OF CHRONICITY, DYSPLASIA, AND MALIGNANCY.  B.  COLON, RIGHT; COLD BIOPSY: - COLONIC MUCOSA WITH FOCAL LYMPHOID AGGREGATES, NON-SPECIFIC. - NEGATIVE FOR GRANULOMAS, FEATURES OF CHRONICITY, DYSPLASIA, AND MALIGNANCY.  C.  COLON, LEFT; COLD BIOPSY: - COLONIC MUCOSA WITH FOCAL LYMPHOID AGGREGATES, NON-SPECIFIC. - NEGATIVE FOR GRANULOMAS, FEATURES OF CHRONICITY, DYSPLASIA, AND MALIGNANCY.  GROSS DESCRIPTION: A. Labeled: cbx  terminal ileum for abnormal mucosa Re ceived: Formalin Collection time: 11:02 AM on 02/04/2023 Placed into formalin time: 11:02 AM on 02/04/2023 Tissue fragment(s): 4 Size: Aggregate, 0.8 x 0.6 x 0.1 cm Description: Tan soft tissue fragments Entirely submitted in 1 cassette.  B. Labeled: cbx right colon for intermittent diarrhea rule out colitis Received: Formalin Collection time: 11:05 AM on 02/04/2023 Placed into formalin time: 11:05 AM on 02/04/2023 Tissue fragment(s): Multiple Size: Aggregate, 1.9 x 0.4 x 0.1 cm Description: Tan soft tissue fragments Entirely submitted in 1 cassette.  C. Labeled: cbx left colon for intermittent diarrhea rule out colitis Received: Formalin Collection time: 11:10 AM on 02/04/2023 Placed into formalin time: 11:10 AM on 02/04/2023 Tissue fragment(s): Multiple Size: Aggregate, 1.7 x 0.5 x 0.1 cm Description: Red-tan soft tissue fragments Entirely submitted in 1 cassette.  RB 02/04/2023   Final Diagnosis performed by Elijah Birk, MD.   Electronically signed 02/05/2023 1 2:36:44PM The electronic signature indicates that the named Attending Pathologist has evaluated the specimen Technical component performed at [facility], [address], [place], [place] [address] Lab: [phone] Dir: Jolene Schimke, MD, MMM  Professional component performed at [facility], [facility], [address] [place], [place], [place] [address] Lab: [phone] Dir: Beryle Quant, MD       Assessment & Plan:   Problem List Items Addressed This Visit   None    Follow up plan: No follow-ups on file.

## 2023-04-13 ENCOUNTER — Encounter: Payer: Self-pay | Admitting: Nurse Practitioner

## 2023-04-13 ENCOUNTER — Ambulatory Visit: Payer: Medicaid Other | Admitting: Nurse Practitioner

## 2023-04-13 VITALS — BP 116/79 | HR 83 | Temp 98.0°F | Wt 252.0 lb

## 2023-04-13 DIAGNOSIS — H6992 Unspecified Eustachian tube disorder, left ear: Secondary | ICD-10-CM

## 2023-04-13 MED ORDER — METHYLPREDNISOLONE 4 MG PO TBPK: ORAL_TABLET | ORAL | 0 refills | Status: DC

## 2023-04-13 MED ORDER — FLUTICASONE PROPIONATE 50 MCG/ACT NA SUSP: 2.0000 | Freq: Every day | NASAL | 6 refills | Status: DC

## 2023-06-15 ENCOUNTER — Ambulatory Visit: Payer: Medicaid Other | Admitting: Dermatology

## 2023-10-14 ENCOUNTER — Other Ambulatory Visit: Payer: Self-pay | Admitting: Nurse Practitioner

## 2023-10-14 NOTE — Telephone Encounter (Signed)
Requested Prescriptions  Pending Prescriptions Disp Refills   VENTOLIN HFA 108 (90 Base) MCG/ACT inhaler [Pharmacy Med Name: VENTOLIN HFA 90 MCG INHALER] 18 each 3    Sig: TAKE 2 PUFFS BY MOUTH EVERY 6 HOURS AS NEEDED FOR WHEEZE OR SHORTNESS OF BREATH     Pulmonology:  Beta Agonists 2 Passed - 10/14/2023  2:35 AM      Passed - Last BP in normal range    BP Readings from Last 1 Encounters:  04/13/23 116/79         Passed - Last Heart Rate in normal range    Pulse Readings from Last 1 Encounters:  04/13/23 83         Passed - Valid encounter within last 12 months    Recent Outpatient Visits           6 months ago Eustachian tube dysfunction, left   [facility] Larae Grooms, NP   6 months ago Dental abscess   [place] [facility] [place] [place] T, NP   11 months ago Class 3 severe obesity due to excess calories without serious comorbidity with body mass index (BMI) of 40.0 to 44.9 in adult [facility])   [place] [facility] Larae Grooms, NP   1 year ago Class 3 severe obesity due to excess calories without serious comorbidity with body mass index (BMI) of 40.0 to 44.9 in adult [facility])   [facility] Larae Grooms, NP   1 year ago Hidradenitis suppurativa    [facility] Larae Grooms, NP

## 2024-02-03 ENCOUNTER — Ambulatory Visit (INDEPENDENT_AMBULATORY_CARE_PROVIDER_SITE_OTHER): Payer: Medicaid Other | Admitting: Pediatrics

## 2024-02-03 ENCOUNTER — Encounter: Payer: Self-pay | Admitting: Pediatrics

## 2024-02-03 VITALS — BP 112/71 | HR 84 | Temp 98.2°F | Ht 66.0 in | Wt 256.4 lb

## 2024-02-03 DIAGNOSIS — J309 Allergic rhinitis, unspecified: Secondary | ICD-10-CM

## 2024-02-03 DIAGNOSIS — R1030 Lower abdominal pain, unspecified: Secondary | ICD-10-CM

## 2024-02-03 DIAGNOSIS — Z1330 Encounter for screening examination for mental health and behavioral disorders, unspecified: Secondary | ICD-10-CM | POA: Diagnosis not present

## 2024-02-03 DIAGNOSIS — H60502 Unspecified acute noninfective otitis externa, left ear: Secondary | ICD-10-CM

## 2024-02-03 DIAGNOSIS — R0981 Nasal congestion: Secondary | ICD-10-CM | POA: Diagnosis not present

## 2024-02-03 DIAGNOSIS — J029 Acute pharyngitis, unspecified: Secondary | ICD-10-CM

## 2024-02-03 DIAGNOSIS — N949 Unspecified condition associated with female genital organs and menstrual cycle: Secondary | ICD-10-CM

## 2024-02-03 DIAGNOSIS — R399 Unspecified symptoms and signs involving the genitourinary system: Secondary | ICD-10-CM

## 2024-02-03 LAB — URINALYSIS, ROUTINE W REFLEX MICROSCOPIC
Bilirubin, UA: NEGATIVE
Glucose, UA: NEGATIVE
Ketones, UA: NEGATIVE
Leukocytes,UA: NEGATIVE
Nitrite, UA: NEGATIVE
Protein,UA: NEGATIVE
Specific Gravity, UA: 1.015 (ref 1.005–1.030)
Urobilinogen, Ur: 0.2 mg/dL (ref 0.2–1.0)
pH, UA: 6 (ref 5.0–7.5)

## 2024-02-03 LAB — MICROSCOPIC EXAMINATION

## 2024-02-03 LAB — WET PREP FOR TRICH, YEAST, CLUE
Clue Cell Exam: NEGATIVE
Trichomonas Exam: NEGATIVE
Yeast Exam: NEGATIVE

## 2024-02-03 MED ORDER — HYDROCORTISONE-ACETIC ACID 1-2 % OT SOLN: 4.0000 [drp] | OTIC | 0 refills | Status: AC

## 2024-02-03 MED ORDER — FLUTICASONE PROPIONATE 50 MCG/ACT NA SUSP: 2.0000 | Freq: Every day | NASAL | 6 refills | Status: AC

## 2024-02-03 NOTE — Progress Notes (Signed)
 Office Visit  BP 112/71   Pulse 84   Temp 98.2 F (36.8 C) (Oral)   Ht 5\' 6"  (1.676 m)   Wt 256 lb 6.4 oz (116.3 kg)   SpO2 98%   BMI 41.38 kg/m    Subjective:    Patient ID: Samantha Villegas, female    DOB: [birth year], 33 y.o.   MRN: [id number]  HPI: Samantha Villegas is a 33 y.o. female  Chief Complaint  Patient presents with   URI    Patient states that she has been having a cough, sinus congestion, sore throat, L ear pain, and drainage for the last 4 to 5 days.    Abdominal Pain    Patient states she has been noticing some lower abdominal pain and pressure lately. States she thinks she may have BV or a UTI.     Discussed the use of AI scribe software for clinical note transcription with the patient, who gave verbal consent to proceed.  History of Present Illness   Samantha Villegas is a 33 year old female with asthma who presents with ear pain and congestion following a viral illness.  Approximately a week and a half ago, she and her household became ill. While her family experienced congestion, cough, and fever, she initially had nausea, vomiting, and diarrhea. These gastrointestinal symptoms resolved after about four days, but she then developed ear pain, throat pain, and congestion. Her ear pain is most pronounced on the left side, although she experiences pressure in both ears, described as similar to 'when you get on the plane.' There is no drainage from the ears.  She has a history of asthma but reports no breathing difficulties during this illness. She typically experiences similar symptoms with seasonal changes due to inconsistent use of allergy medications like Flonase. She has been managing her sinus symptoms with over-the-counter medications such as Theraflu, which she feels has slightly alleviated her sinus pressure. She has not used Afrin spray or Debrox earwax softener before.  She has a history of frequent urinary tract infections (UTIs) and  bacterial vaginosis (BV), which she associates with feeling unwell when untreated. She was last treated for these conditions in 2023. No current abdominal pain, hematuria, or increased urinary frequency.  She has a history of acute lower back and pelvic arthritis, which makes it difficult for her to discern symptoms of back pain.     Relevant past medical, surgical, family and social history reviewed and updated as indicated. Interim medical history since our last visit reviewed. Allergies and medications reviewed and updated.  ROS per HPI unless specifically indicated above     Objective:    BP 112/71   Pulse 84   Temp 98.2 F (36.8 C) (Oral)   Ht 5\' 6"  (1.676 m)   Wt 256 lb 6.4 oz (116.3 kg)   SpO2 98%   BMI 41.38 kg/m   Wt Readings from Last 3 Encounters:  02/03/24 256 lb 6.4 oz (116.3 kg)  04/13/23 252 lb (114.3 kg)  03/30/23 247 lb (112 kg)     Physical Exam Constitutional:      Appearance: Normal appearance.  HENT:     Head: Normocephalic and atraumatic.     Right Ear: Hearing and ear canal normal.     Left Ear: Tympanic membrane is not scarred, perforated, erythematous or retracted.     Ears:     Comments: Erythematous canal with some mucus-like yellow/white patches    Nose: Congestion present.  Eyes:     Pupils: Pupils are equal, round, and reactive to light.  Cardiovascular:     Rate and Rhythm: Normal rate and regular rhythm.     Pulses: Normal pulses.     Heart sounds: Normal heart sounds.  Pulmonary:     Effort: Pulmonary effort is normal.     Breath sounds: Normal breath sounds.  Abdominal:     General: Abdomen is flat.     Palpations: Abdomen is soft.  Musculoskeletal:        General: Normal range of motion.     Cervical back: Normal range of motion.  Skin:    General: Skin is warm and dry.     Capillary Refill: Capillary refill takes less than 2 seconds.  Neurological:     General: No focal deficit present.     Mental Status: She is alert.  Mental status is at baseline.  Psychiatric:        Mood and Affect: Mood normal.        Behavior: Behavior normal.         02/03/2024   10:49 AM 09/25/2022   11:39 AM 04/14/2022    9:26 AM 04/08/2021    9:55 AM 03/20/2021    2:31 PM  Depression screen PHQ 2/9  Decreased Interest 0 0 0 0 1  Down, Depressed, Hopeless 0 0 0 0 0  PHQ - 2 Score 0 0 0 0 1  Altered sleeping 0 2 2    Tired, decreased energy 3 2 3     Change in appetite 0 2 1    Feeling bad or failure about yourself  0 0 0    Trouble concentrating 0 0 0    Moving slowly or fidgety/restless 0 0 0    Suicidal thoughts 0 0 0    PHQ-9 Score 3 6 6     Difficult doing work/chores Not difficult at all Not difficult at all Somewhat difficult         02/03/2024   10:49 AM 09/25/2022   11:40 AM 04/14/2022    9:26 AM  GAD 7 : Generalized Anxiety Score  Nervous, Anxious, on Edge 2 0 1  Control/stop worrying 0 0 1  Worry too much - different things 2 0 1  Trouble relaxing 0 2 0  Restless 1 2 0  Easily annoyed or irritable 2 2 0  Afraid - awful might happen 0 0 0  Total GAD 7 Score 7 6 3   Anxiety Difficulty Somewhat difficult Not difficult at all Not difficult at all       Assessment & Plan:  Assessment & Plan   Sore throat Congested nose Recovering from URI. Recent history of household illness with symptoms of congestion, cough, and fever. Patient experienced initial symptoms of nausea, vomiting, and diarrhea, followed by ear pain, throat pain, and congestion. Symptoms are improving. -Continue over-the-counter cold and flu medication. -Recommend over-the-counter Afrin spray for decongestion, to be used no more than three days at a time. -     Novel Coronavirus, NAA ([facility]) -     Veritor Flu A/B Waived -     Rapid Strep Screen ([facility] ONLY) -     Culture, Group A Strep -     Microscopic Examination  Acute otitis externa of left ear, unspecified type Pain and pressure in the left ear with no drainage. Examination  revealed inflammation in the external canal. No signs of infection in the right ear. -Prescribe ear drops  for 7 days, to be used every 4-6 hours. -Recommend over-the-counter Debrox for earwax softening. -     Hydrocortisone-Acetic Acid; Place 4 drops into the left ear every 4 (four) hours for 7 days.  Dispense: 8.4 mL; Refill: 0  Urinary tract infection symptoms Lower abdominal pain Vaginal discomfort Patient reports frequent UTIs and BV, with the last treatment noted in 2023. No current symptoms of UTI or BV reported. -Consider further evaluation if symptoms recur. -     Urinalysis, Routine w reflex microscopic -     WET PREP FOR TRICH, YEAST, CLUE -     Urine Culture   Allergic rhinitis, unspecified seasonality, unspecified trigger Requesting refills -     Fluticasone Propionate; Place 2 sprays into both nostrils daily.  Dispense: 16 g; Refill: 6  Encounter for behavioral health screening As part of their intake evaluation, the patient was screened for depression, anxiety.  PHQ9 SCORE 3, GAD7 SCORE 7. Screening results negative for tested conditions. CTM.   Follow up plan: Return if symptoms worsen or fail to improve.  Jackolyn Confer, MD

## 2024-02-03 NOTE — Patient Instructions (Addendum)
 Debrox over the counter for ear wax For congestion: oxymetazoline (Afrin) nasal stray: 2 sprays each nostril every 12 hours. Don't use more than 3 days in a row to avoid building a tolerance to it.   Most cold symptoms last up to 2 weeks, but cough can sometimes linger up to 4 weeks.  However if your symtpoms get WORSE - like you develop fevers or get more shortness of breath, then call your clinic as you may need to be evaluated.   Aches and Pains Acetaminophen (Tylenol): 1000mg  ("extra strength" tablets are 500mg , so take 2) every 8 hours if needed  Ibuprofen (Advil/Motrin) 400-800mg  (comes in 200mg  pills OTC, so 2-4 pills) every 8 hours. - Avoid in excess if cardiac blood pressure issues  Sore Throat:  See Aches and Pains meds above, also Sore throat sprays and lozenges may also help.   Cough:  Honey 2 TBS every 4-6 hours if needed.  Robitussin DM syrup or generic equivalent which has (guaifenesin = an expectorant to help you get stuff up + dextromethorphan (DM) = cough supressant). You can also get this in tablet formula (like Mucinex DM or generic equivalent).  If you have asthma or are wheezing and have a tight chest, then albuterol inhaler (Ventolin, ProAir) may be helpful - you need a prescription for this.   Congestion:  Sinus rinse (neti pot) high volume sinus rinse can help open up your sinuses and be helpful, especially if you're having sinus pressure and headaches.   Other:  Umcka (pelargonium sidoides extract) can to shorten cold symptoms (can be hard to find, but [employer] carries it: brand name Umcka ColdCare from [employer]). Works best if you start taking at earliest signs of cold symptoms.  Andrographis paniculata is another herbal remedy with less evidence, but may reduce common cold symptoms in adults.  zinc acetate lozenges >= 80 mg/day reduces duration but not severity of cold symptoms in adults, but it is associated with bad taste and nausea Heated humidified air  may reduce cold symptoms, so try using a humidifier - especially in your bedroom at night.  Stay hydrated! Aim to drink at least 2 liters of water daily.   What doesn't work (but lots of folks think might) Vitamin C: bummer right?! But there's no evidence that high dose vitamin C will help cold symptoms.

## 2024-02-05 LAB — NOVEL CORONAVIRUS, NAA: SARS-CoV-2, NAA: NOT DETECTED

## 2024-02-05 LAB — URINE CULTURE

## 2024-02-07 LAB — CULTURE, GROUP A STREP

## 2024-02-07 LAB — VERITOR FLU A/B WAIVED
Influenza A: NEGATIVE
Influenza B: NEGATIVE

## 2024-02-07 LAB — RAPID STREP SCREEN (MED CTR MEBANE ONLY): Strep Gp A Ag, IA W/Reflex: NEGATIVE

## 2024-02-16 ENCOUNTER — Other Ambulatory Visit: Payer: Self-pay | Admitting: Pediatrics

## 2024-02-16 DIAGNOSIS — J020 Streptococcal pharyngitis: Secondary | ICD-10-CM

## 2024-02-16 MED ORDER — PENICILLIN V POTASSIUM 500 MG PO TABS: 500.0000 mg | ORAL_TABLET | Freq: Two times a day (BID) | ORAL | 0 refills | Status: AC

## 2024-02-23 ENCOUNTER — Other Ambulatory Visit: Payer: Self-pay

## 2024-02-23 DIAGNOSIS — J45909 Unspecified asthma, uncomplicated: Secondary | ICD-10-CM | POA: Insufficient documentation

## 2024-02-23 DIAGNOSIS — J039 Acute tonsillitis, unspecified: Secondary | ICD-10-CM | POA: Insufficient documentation

## 2024-02-23 DIAGNOSIS — D72829 Elevated white blood cell count, unspecified: Secondary | ICD-10-CM | POA: Diagnosis not present

## 2024-02-23 DIAGNOSIS — R509 Fever, unspecified: Secondary | ICD-10-CM | POA: Diagnosis not present

## 2024-02-23 DIAGNOSIS — J029 Acute pharyngitis, unspecified: Secondary | ICD-10-CM | POA: Diagnosis present

## 2024-02-23 MED ORDER — ACETAMINOPHEN 325 MG PO TABS
650.0000 mg | ORAL_TABLET | Freq: Once | ORAL | Status: AC
  Administered 2024-02-23: 650 mg via ORAL
  Filled 2024-02-23: qty 2

## 2024-02-23 NOTE — ED Triage Notes (Signed)
 Pt to ED vi POV c/o sore throat, headache, and chills. Pt tested positive for strep on 11th. Pt was prescribed penicillin. Took 3rd dose today. Pt reports feeling worse.

## 2024-02-24 ENCOUNTER — Emergency Department
Admission: EM | Admit: 2024-02-24 | Discharge: 2024-02-24 | Disposition: A | Discharge status: Home / Self Care | Attending: Emergency Medicine | Admitting: Emergency Medicine

## 2024-02-24 ENCOUNTER — Emergency Department

## 2024-02-24 DIAGNOSIS — J039 Acute tonsillitis, unspecified: Secondary | ICD-10-CM

## 2024-02-24 LAB — CBC WITH DIFFERENTIAL/PLATELET
Abs Immature Granulocytes: 0.07 10*3/uL (ref 0.00–0.07)
Basophils Absolute: 0.1 10*3/uL (ref 0.0–0.1)
Basophils Relative: 1 %
Eosinophils Absolute: 0.1 10*3/uL (ref 0.0–0.5)
Eosinophils Relative: 1 %
HCT: 41.8 % (ref 36.0–46.0)
Hemoglobin: 14 g/dL (ref 12.0–15.0)
Immature Granulocytes: 0 %
Lymphocytes Relative: 14 %
Lymphs Abs: 2.4 10*3/uL (ref 0.7–4.0)
MCH: 28.9 pg (ref 26.0–34.0)
MCHC: 33.5 g/dL (ref 30.0–36.0)
MCV: 86.4 fL (ref 80.0–100.0)
Monocytes Absolute: 1.7 10*3/uL — ABNORMAL HIGH (ref 0.1–1.0)
Monocytes Relative: 10 %
Neutro Abs: 13 10*3/uL — ABNORMAL HIGH (ref 1.7–7.7)
Neutrophils Relative %: 74 %
Platelets: 252 10*3/uL (ref 150–400)
RBC: 4.84 MIL/uL (ref 3.87–5.11)
RDW: 14 % (ref 11.5–15.5)
WBC: 17.4 10*3/uL — ABNORMAL HIGH (ref 4.0–10.5)
nRBC: 0 % (ref 0.0–0.2)

## 2024-02-24 LAB — BASIC METABOLIC PANEL
Anion gap: 10 (ref 5–15)
BUN: 8 mg/dL (ref 6–20)
CO2: 21 mmol/L — ABNORMAL LOW (ref 22–32)
Calcium: 8.7 mg/dL — ABNORMAL LOW (ref 8.9–10.3)
Chloride: 101 mmol/L (ref 98–111)
Creatinine, Ser: 0.65 mg/dL (ref 0.44–1.00)
GFR, Estimated: >60 mL/min (ref >60)
Glucose, Bld: 105 mg/dL — ABNORMAL HIGH (ref 70–99)
Potassium: 3.6 mmol/L (ref 3.5–5.1)
Sodium: 132 mmol/L — ABNORMAL LOW (ref 135–145)

## 2024-02-24 LAB — HCG, QUANTITATIVE, PREGNANCY: hCG, Beta Chain, Quant, S: 1 m[IU]/mL (ref <5)

## 2024-02-24 LAB — RESP PANEL BY RT-PCR (RSV, FLU A&B, COVID)  RVPGX2
Influenza A by PCR: NEGATIVE
Influenza B by PCR: NEGATIVE
Resp Syncytial Virus by PCR: NEGATIVE
SARS Coronavirus 2 by RT PCR: NEGATIVE

## 2024-02-24 MED ORDER — LIDOCAINE VISCOUS HCL 2 % MT SOLN: 15.0000 mL | OROMUCOSAL | 0 refills | Status: DC | PRN

## 2024-02-24 MED ORDER — LIDOCAINE VISCOUS HCL 2 % MT SOLN
15.0000 mL | Freq: Once | OROMUCOSAL | Status: AC
  Administered 2024-02-24: 15 mL via OROMUCOSAL
  Filled 2024-02-24: qty 15

## 2024-02-24 MED ORDER — IOHEXOL 300 MG/ML  SOLN
75.0000 mL | Freq: Once | INTRAMUSCULAR | Status: AC | PRN
  Administered 2024-02-24: 75 mL via INTRAVENOUS

## 2024-02-24 MED ORDER — PENICILLIN G BENZATHINE 1200000 UNIT/2ML IM SUSY
1.2000 10*6.[IU] | PREFILLED_SYRINGE | Freq: Once | INTRAMUSCULAR | Status: AC
  Administered 2024-02-24: 1.2 10*6.[IU] via INTRAMUSCULAR
  Filled 2024-02-24: qty 2

## 2024-02-24 MED ORDER — KETOROLAC TROMETHAMINE 30 MG/ML IJ SOLN
30.0000 mg | Freq: Once | INTRAMUSCULAR | Status: AC
  Administered 2024-02-24: 30 mg via INTRAVENOUS
  Filled 2024-02-24: qty 1

## 2024-02-24 MED ORDER — DEXAMETHASONE SODIUM PHOSPHATE 10 MG/ML IJ SOLN
10.0000 mg | Freq: Once | INTRAMUSCULAR | Status: AC
  Administered 2024-02-24: 10 mg via INTRAVENOUS
  Filled 2024-02-24: qty 1

## 2024-02-24 MED ORDER — SODIUM CHLORIDE 0.9 % IV BOLUS (SEPSIS)
1000.0000 mL | Freq: Once | INTRAVENOUS | Status: AC
  Administered 2024-02-24: 1000 mL via INTRAVENOUS

## 2024-02-24 NOTE — Discharge Instructions (Addendum)
 You may alternate Tylenol 1000 mg every 6 hours as needed for pain, fever and Ibuprofen 800 mg every 6-8 hours as needed for pain, fever.  Please take Ibuprofen with food.  Do not take more than 4000 mg of Tylenol (acetaminophen) in a 24 hour period.  You may gargle warm salt water several times a day which can help with pain.  You may use Chloraseptic spray, throat lozenges to help with discomfort.  I recommend increase fluid intake to stay well-hydrated.  You may discontinue your oral antibiotics as we have given you a shot of penicillin that will last for 3 weeks.

## 2024-02-24 NOTE — ED Provider Notes (Signed)
 [facility] Provider Note    Event Date/Time   First MD Initiated Contact with Patient 02/24/24 [phone]     (approximate)   History   Sore Throat   HPI  Samantha Villegas is a 33 y.o. female with history of asthma, anxiety who presents to the emergency department severe sore throat, difficulty swallowing, headache, pain in her neck.  She states she was seen in urgent care and had a throat culture that was positive for strep.  She was started on penicillin twice daily which she has been taking and feels like her symptoms are worsening.  She reports having fevers.  No vomiting, diarrhea.  No difficulty breathing.   History provided by patient.    Past Medical History:  Diagnosis Date   Allergy    Anxiety    no meds   Asthma    h/o no inhalers   GERD (gastroesophageal reflux disease)    no meds   History of itching    Pyelonephritis    Skipped heart beats    pt states this happens occ but is asymptomatic    Past Surgical History:  Procedure Laterality Date   CESAREAN SECTION     COLONOSCOPY WITH PROPOFOL N/A 02/04/2023   Procedure: COLONOSCOPY WITH PROPOFOL;  Surgeon: Toney Reil, MD;  Location: [facility] ENDOSCOPY;  Service: Gastroenterology;  Laterality: N/A;   LAPAROSCOPIC OVARIAN CYSTECTOMY Right 05/26/2018   Procedure: LAPAROSCOPIC RIGHT OVARIAN CYSTECTOMY;  Surgeon: Natale Milch, MD;  Location: [facility] ORS;  Service: Gynecology;  Laterality: Right;    MEDICATIONS:  Prior to Admission medications   Medication Sig Start Date End Date Taking? Authorizing Provider  fluticasone (FLONASE) 50 MCG/ACT nasal spray Place 2 sprays into both nostrils daily. 02/03/24   Jackolyn Confer, MD  penicillin v potassium (VEETID) 500 MG tablet Take 1 tablet (500 mg total) by mouth 2 (two) times daily for 10 days. 02/16/24 02/26/24  Jackolyn Confer, MD  VENTOLIN HFA 108 (90 Base) MCG/ACT inhaler TAKE 2 PUFFS BY MOUTH EVERY 6 HOURS AS NEEDED FOR WHEEZE OR  SHORTNESS OF BREATH 10/14/23   Larae Grooms, NP  Vitamin D, Ergocalciferol, (DRISDOL) 1.25 MG (50000 UNIT) CAPS capsule Take 50,000 Units by mouth once a week. Patient not taking: Reported on 02/03/2024 12/30/22   [provider]    Physical Exam   Triage Vital Signs: ED Triage Vitals  Encounter Vitals Group     BP 02/23/24 2131 116/88     Systolic BP Percentile --      Diastolic BP Percentile --      Pulse Rate 02/23/24 2131 (!) 102     Resp 02/23/24 2131 18     Temp 02/23/24 2131 (!) 100.8 F (38.2 C)     Temp Source 02/23/24 2131 Oral     SpO2 02/23/24 2131 95 %     Weight 02/23/24 2127 260 lb (117.9 kg)     Height 02/23/24 2127 5\' 6"  (1.676 m)     Head Circumference --      Peak Flow --      Pain Score 02/23/24 2127 10     Pain Loc --      Pain Education --      Exclude from Growth Chart --     Most recent vital signs: Vitals:   02/24/24 0515 02/24/24 0632  BP: 103/60 106/62  Pulse: 89 90  Resp: 18 17  Temp: 98.9 F (37.2 C) 99 F (37.2 C)  SpO2: 100% 98%    CONSTITUTIONAL: Alert, responds appropriately to questions. Well-appearing; well-nourished HEAD: Normocephalic, atraumatic EYES: Conjunctivae clear, pupils appear equal, sclera nonicteric ENT: normal nose; moist mucous membranes, patient has bilateral tonsillar hypertrophy with kissing tonsils, bilateral tonsillar exudate, no uvular deviation, no trismus or drooling, no stridor, normal phonation, handling her secretions without difficulty NECK: Supple, normal ROM; + anterior cervical lymphadenopathy, no meningismus CARD: RRR; S1 and S2 appreciated RESP: Normal chest excursion without splinting or tachypnea; breath sounds clear and equal bilaterally; no wheezes, no rhonchi, no rales, no hypoxia or respiratory distress, speaking full sentences ABD/GI: Non-distended; soft, non-tender, no rebound, no guarding, no peritoneal signs BACK: The back appears normal EXT: Normal ROM in all joints; no deformity  noted, no edema SKIN: Normal color for age and race; warm; no rash on exposed skin NEURO: Moves all extremities equally, normal speech PSYCH: The patient's mood and manner are appropriate.   ED Results / Procedures / Treatments   LABS: (all labs ordered are listed, but only abnormal results are displayed) Labs Reviewed  CBC WITH DIFFERENTIAL/PLATELET - Abnormal; Notable for the following components:      Result Value   WBC 17.4 (*)    Neutro Abs 13.0 (*)    Monocytes Absolute 1.7 (*)    All other components within normal limits  BASIC METABOLIC PANEL - Abnormal; Notable for the following components:   Sodium 132 (*)    CO2 21 (*)    Glucose, Bld 105 (*)    Calcium 8.7 (*)    All other components within normal limits  RESP PANEL BY RT-PCR (RSV, FLU A&B, COVID)  RVPGX2  HCG, QUANTITATIVE, PREGNANCY     EKG:  RADIOLOGY: My personal review and interpretation of imaging: CT shows no PTA; + tonsillitis.  I have personally reviewed all radiology reports.   CT Soft Tissue Neck W Contrast Result Date: 02/24/2024 CLINICAL DATA:  33 year old female with sore throat, headache and chills. Tested positive for Streptococcus earlier this month. Feeling worse. EXAM: CT NECK WITH CONTRAST TECHNIQUE: Multidetector CT imaging of the neck was performed using the standard protocol following the bolus administration of intravenous contrast. RADIATION DOSE REDUCTION: This exam was performed according to the departmental dose-optimization program which includes automated exposure control, adjustment of the mA and/or kV according to patient size and/or use of iterative reconstruction technique. CONTRAST:  75mL OMNIPAQUE IOHEXOL 300 MG/ML  SOLN COMPARISON:  None Available. FINDINGS: Pharynx and larynx: Larynx and epiglottis remain normal. Bulky bilateral palatine tonsil enlargement with variegated hyperenhancement, and some gas trapped within tonsillar crypts on the right (series 2, image 35). Similar bulky  enlargement and heterogeneous enhancement of the adenoids (series 2, image 22). Moderate enlargement also of the lingual tonsils. However, no inflammation in the parapharyngeal spaces. Retropharyngeal space also appears to remain normal. No discrete tonsillar suppuration. Salivary glands: Negative.  Negative sublingual space. Thyroid: Negative. Lymph nodes: Bulky solid, round reactive appearing bilateral level 2 cervical lymphadenopathy. Smaller but reactive appearing lymph nodes in the bilateral level 3 stations also. Individual lymph nodes are up to 19 mm short axis (series 2, image 45). No cystic or necrotic nodes. Level 1, level 5, level 4 nodal stations relatively spared. Vascular: Major vascular structures in the neck and at the skull base remain patent including both internal jugular veins. The left IJ is dominant. No atherosclerosis. Limited intracranial: Negative. Visualized orbits: Negative. Mastoids and visualized paranasal sinuses: Clear throughout. Skeleton: No acute dental finding identified. Congenital incomplete ossification of  the posterior C1 ring, normal variant. No acute osseous abnormality identified. Upper chest: Mild respiratory motion, but the visible upper chest appears negative. IMPRESSION: 1. Bulky and generalized bilateral Tonsillitis. Pronounced enlargement of all pharyngeal tonsillar tissue but no suppuration or abscess. 2. Associated bulky, reactive bilateral level 2 and 3 cervical lymphadenopathy. Electronically Signed   By: Odessa Fleming M.D.   On: 02/24/2024 05:22     PROCEDURES:  Critical Care performed: No     Procedures    IMPRESSION / MDM / ASSESSMENT AND PLAN / ED COURSE  I reviewed the triage vital signs and the nursing notes.    Patient here with bilateral tonsillitis, difficulty swallowing despite being on oral antibiotics.  The patient is on the cardiac monitor to evaluate for evidence of arrhythmia and/or significant heart rate changes.   DIFFERENTIAL  DIAGNOSIS (includes but not limited to):   Tonsillitis, strep pharyngitis, mononucleosis, PTA, deep space neck infection   Patient's presentation is most consistent with acute presentation with potential threat to life or bodily function.   PLAN: Will obtain labs, CT of the soft tissues of the neck.  COVID, flu and RSV negative.  Will give IV fluids, Toradol, Decadron, viscous lidocaine for symptomatic relief.  Discussed options of treatment for tonsillitis.  Patient agrees to proceed with IM penicillin.   MEDICATIONS GIVEN IN ED: Medications  acetaminophen (TYLENOL) tablet 650 mg (650 mg Oral Given 02/23/24 2136)  penicillin g benzathine (BICILLIN LA) 1200000 UNIT/2ML injection 1.2 Million Units (1.2 Million Units Intramuscular Given 02/24/24 0400)  sodium chloride 0.9 % bolus 1,000 mL (0 mLs Intravenous Stopped 02/24/24 0446)  ketorolac (TORADOL) 30 MG/ML injection 30 mg (30 mg Intravenous Given 02/24/24 0358)  dexamethasone (DECADRON) injection 10 mg (10 mg Intravenous Given 02/24/24 0359)  lidocaine (XYLOCAINE) 2 % viscous mouth solution 15 mL (15 mLs Mouth/Throat Given 02/24/24 0355)  iohexol (OMNIPAQUE) 300 MG/ML solution 75 mL (75 mLs Intravenous Contrast Given 02/24/24 0453)     ED COURSE: Patient's labs show leukocytosis of 17,000.  Normal electrolytes, creatinine.  CT scan reviewed and interpreted by myself and the radiologist and shows tonsillitis with cervical lymphadenopathy but no deep space neck infection, PTA.  On reevaluation patient states she is feeling "much better".  She is tolerating p.o.  Recommended Tylenol, Motrin as needed.  Recommended warm salt water gargles and will discharge with viscous lidocaine.  Discussed with her that she can stop her oral penicillin given she received IM penicillin today.  Discussed return precautions.  She verbalized understanding.   At this time, I do not feel there is any life-threatening condition present. I reviewed all nursing notes, vitals,  pertinent previous records.  All lab and urine results, EKGs, imaging ordered have been independently reviewed and interpreted by myself.  I reviewed all available radiology reports from any imaging ordered this visit.  Based on my assessment, I feel the patient is safe to be discharged home without further emergent workup and can continue workup as an outpatient as needed. Discussed all findings, treatment plan as well as usual and customary return precautions.  They verbalize understanding and are comfortable with this plan.  Outpatient follow-up has been provided as needed.  All questions have been answered.    CONSULTS:  none   OUTSIDE RECORDS REVIEWED: Reviewed last family medicine note on 02/03/2024.       FINAL CLINICAL IMPRESSION(S) / ED DIAGNOSES   Final diagnoses:  Tonsillitis     Rx / DC Orders   ED  Discharge Orders          Ordered    lidocaine (XYLOCAINE) 2 % solution  Every 4 hours PRN        02/24/24 0529             Note:  This document was prepared using Dragon voice recognition software and may include unintentional dictation errors.   Davide Risdon, [place]la Maw, [place] 02/24/24 [phone]

## 2024-02-24 NOTE — ED Notes (Signed)
 RN to bedside to introduce self to pt. Pt resting and in no acute distress.

## 2024-03-15 ENCOUNTER — Encounter: Payer: Medicaid Other | Admitting: Nurse Practitioner

## 2024-03-24 ENCOUNTER — Encounter: Payer: Self-pay | Admitting: Nurse Practitioner

## 2024-03-24 ENCOUNTER — Ambulatory Visit (INDEPENDENT_AMBULATORY_CARE_PROVIDER_SITE_OTHER): Admitting: Nurse Practitioner

## 2024-03-24 VITALS — BP 104/72 | HR 108 | Temp 98.4°F | Resp 16 | Ht 65.39 in | Wt 258.0 lb

## 2024-03-24 DIAGNOSIS — Z0000 Encounter for general adult medical examination without abnormal findings: Secondary | ICD-10-CM

## 2024-03-24 DIAGNOSIS — Z136 Encounter for screening for cardiovascular disorders: Secondary | ICD-10-CM | POA: Diagnosis not present

## 2024-03-24 DIAGNOSIS — E559 Vitamin D deficiency, unspecified: Secondary | ICD-10-CM | POA: Diagnosis not present

## 2024-03-24 NOTE — Progress Notes (Signed)
 BP 104/72 (BP Location: Left Arm, Patient Position: Sitting, Cuff Size: Large)   Pulse (!) 108   Temp 98.4 F (36.9 C) (Oral)   Resp 16   Ht 5' 5.39" (1.661 m)   Wt 258 lb (117 kg)   LMP 02/28/2024 (Approximate)   SpO2 98%   BMI 42.42 kg/m    Subjective:    Patient ID: Samantha Villegas, female    DOB: December 28, 1990, 33 y.o.   MRN: [id number]  HPI: Samantha Villegas is a 33 y.o. female presenting on 03/24/2024 for comprehensive medical examination. Current medical complaints include:none  She currently lives with: Menopausal Symptoms: no  Still smoking daily.  She is smoking about a pack of black and mild's per day.     Depression Screen done today and results listed below:     03/24/2024    9:55 AM 02/03/2024   10:49 AM 09/25/2022   11:39 AM 04/14/2022    9:26 AM 04/08/2021    9:55 AM  Depression screen PHQ 2/9  Decreased Interest 0 0 0 0 0  Down, Depressed, Hopeless 1 0 0 0 0  PHQ - 2 Score 1 0 0 0 0  Altered sleeping 1 0 2 2   Tired, decreased energy 2 3 2 3    Change in appetite 1 0 2 1   Feeling bad or failure about yourself  0 0 0 0   Trouble concentrating 0 0 0 0   Moving slowly or fidgety/restless 0 0 0 0   Suicidal thoughts 0 0 0 0   PHQ-9 Score 5 3 6 6    Difficult doing work/chores Somewhat difficult Not difficult at all Not difficult at all Somewhat difficult     The patient does not have a history of falls. I did complete a risk assessment for falls. A plan of care for falls was documented.   Past Medical History:  Past Medical History:  Diagnosis Date   Allergy    Anxiety    no meds   Arthritis    Asthma    h/o no inhalers   GERD (gastroesophageal reflux disease)    no meds   History of itching    Pyelonephritis    Skipped heart beats    pt states this happens occ but is asymptomatic    Surgical History:  Past Surgical History:  Procedure Laterality Date   CESAREAN SECTION     COLONOSCOPY WITH PROPOFOL  N/A 02/04/2023   Procedure:  COLONOSCOPY WITH PROPOFOL ;  Surgeon: Selena Daily, MD;  Location: [facility];  Service: Gastroenterology;  Laterality: N/A;   LAPAROSCOPIC OVARIAN CYSTECTOMY Right 05/26/2018   Procedure: LAPAROSCOPIC RIGHT OVARIAN CYSTECTOMY;  Surgeon: Heron Lord, MD;  Location: [facility];  Service: Gynecology;  Laterality: Right;    Medications:  Current Outpatient Medications on File Prior to Visit  Medication Sig   fluticasone  (FLONASE ) 50 MCG/ACT nasal spray Place 2 sprays into both nostrils daily.   lidocaine  (XYLOCAINE ) 2 % solution Use as directed 15 mLs in the mouth or throat every 4 (four) hours as needed for mouth pain.   VENTOLIN  HFA 108 (90 Base) MCG/ACT inhaler TAKE 2 PUFFS BY MOUTH EVERY 6 HOURS AS NEEDED FOR WHEEZE OR SHORTNESS OF BREATH   No current facility-administered medications on file prior to visit.    Allergies:  Allergies  Allergen Reactions   Latex Other (See Comments)    With latex condoms--uti/bacterial infections.    Social History:  Social History  Socioeconomic History   Marital status: Single    Spouse name: Not on file   Number of children: Not on file   Years of education: Not on file   Highest education level: Bachelor's degree (e.g., BA, AB, BS)  Occupational History   Not on file  Tobacco Use   Smoking status: Every Day    Types: Cigars   Smokeless tobacco: Never  Vaping Use   Vaping status: Former  Substance and Sexual Activity   Alcohol use: Yes    Comment: social   Drug use: Not Currently    Types: Marijuana    Comment: Occassionally   Sexual activity: Yes    Birth control/protection: Injection, None  Other Topics Concern   Not on file  Social History Narrative   Not on file   Social Drivers of Health   Financial Resource Strain: Medium Risk (03/23/2024)   Overall Financial Resource Strain (CARDIA)    Difficulty of Paying Living Expenses: Somewhat hard  Food Insecurity: No Food Insecurity (03/23/2024)   Hunger Vital  Sign    Worried About Running Out of Food in the Last Year: Never true    Ran Out of Food in the Last Year: Never true  Transportation Needs: No Transportation Needs (03/23/2024)   PRAPARE - Administrator, Civil Service (Medical): No    Lack of Transportation (Non-Medical): No  Physical Activity: Insufficiently Active (03/23/2024)   Exercise Vital Sign    Days of Exercise per Week: 3 days    Minutes of Exercise per Session: 10 min  Stress: Stress Concern Present (03/23/2024)   [employer] of Occupational Health - Occupational Stress Questionnaire    Feeling of Stress : To some extent  Social Connections: Socially Isolated (03/23/2024)   Social Connection and Isolation Panel [NHANES]    Frequency of Communication with Friends and Family: More than three times a week    Frequency of Social Gatherings with Friends and Family: Twice a week    Attends Religious Services: Never    Diplomatic Services operational officer: No    Attends Engineer, structural: Not on file    Marital Status: Never married  Catering manager Violence: Not on file   Social History   Tobacco Use  Smoking Status Every Day   Types: Cigars  Smokeless Tobacco Never   Social History   Substance and Sexual Activity  Alcohol Use Yes   Comment: social    Family History:  Family History  Problem Relation Age of Onset   Arthritis Mother    Asthma Mother    Hyperlipidemia Mother    Hypertension Mother    Stroke Mother    Ulcers Mother    Sleep apnea Father    Drug abuse Brother    Early death Brother    Diabetes Paternal Aunt    Cancer Maternal Grandmother    Heart disease Paternal Grandmother    Heart disease Paternal Grandfather     Past medical history, surgical history, medications, allergies, family history and social history reviewed with patient today and changes made to appropriate areas of the chart.   Review of Systems  All other systems reviewed and are  negative.  All other ROS negative except what is listed above and in the HPI.      Objective:    BP 104/72 (BP Location: Left Arm, Patient Position: Sitting, Cuff Size: Large)   Pulse (!) 108   Temp 98.4 F (36.9 C) (Oral)  Resp 16   Ht 5' 5.39" (1.661 m)   Wt 258 lb (117 kg)   LMP 02/28/2024 (Approximate)   SpO2 98%   BMI 42.42 kg/m   Wt Readings from Last 3 Encounters:  03/24/24 258 lb (117 kg)  02/23/24 260 lb (117.9 kg)  02/03/24 256 lb 6.4 oz (116.3 kg)    Physical Exam Vitals and nursing note reviewed.  Constitutional:      General: She is awake. She is not in acute distress.    Appearance: Normal appearance. She is well-developed. She is obese. She is not ill-appearing.  HENT:     Head: Normocephalic and atraumatic.     Right Ear: Hearing, tympanic membrane, ear canal and external ear normal. No drainage.     Left Ear: Hearing, tympanic membrane, ear canal and external ear normal. No drainage.     Nose: Nose normal.     Right Sinus: No maxillary sinus tenderness or frontal sinus tenderness.     Left Sinus: No maxillary sinus tenderness or frontal sinus tenderness.     Mouth/Throat:     Mouth: Mucous membranes are moist.     Pharynx: Oropharynx is clear. Uvula midline. No pharyngeal swelling, oropharyngeal exudate or posterior oropharyngeal erythema.  Eyes:     General: Lids are normal.        Right eye: No discharge.        Left eye: No discharge.     Extraocular Movements: Extraocular movements intact.     Conjunctiva/sclera: Conjunctivae normal.     Pupils: Pupils are equal, round, and reactive to light.     Visual Fields: Right eye visual fields normal and left eye visual fields normal.  Neck:     Thyroid: No thyromegaly.     Vascular: No carotid bruit.     Trachea: Trachea normal.  Cardiovascular:     Rate and Rhythm: Normal rate and regular rhythm.     Heart sounds: Normal heart sounds. No murmur heard.    No gallop.  Pulmonary:     Effort:  Pulmonary effort is normal. No accessory muscle usage or respiratory distress.     Breath sounds: Normal breath sounds.  Chest:  Breasts:    Right: Normal.     Left: Normal.  Abdominal:     General: Bowel sounds are normal.     Palpations: Abdomen is soft. There is no hepatomegaly or splenomegaly.     Tenderness: There is no abdominal tenderness.  Musculoskeletal:        General: Normal range of motion.     Cervical back: Normal range of motion and neck supple.     Right lower leg: No edema.     Left lower leg: No edema.  Lymphadenopathy:     Head:     Right side of head: No submental, submandibular, tonsillar, preauricular or posterior auricular adenopathy.     Left side of head: No submental, submandibular, tonsillar, preauricular or posterior auricular adenopathy.     Cervical: No cervical adenopathy.     Upper Body:     Right upper body: No supraclavicular, axillary or pectoral adenopathy.     Left upper body: No supraclavicular, axillary or pectoral adenopathy.  Skin:    General: Skin is warm and dry.     Capillary Refill: Capillary refill takes less than 2 seconds.     Findings: No rash.  Neurological:     Mental Status: She is alert and oriented to person, place, and time.  Gait: Gait is intact.  Psychiatric:        Attention and Perception: Attention normal.        Mood and Affect: Mood normal.        Speech: Speech normal.        Behavior: Behavior normal. Behavior is cooperative.        Thought Content: Thought content normal.        Judgment: Judgment normal.     Results for orders placed or performed during the hospital encounter of 02/24/24  Resp panel by RT-PCR (RSV, Flu A&B, Covid) Anterior Nasal Swab   Collection Time: 02/23/24  9:39 PM   Specimen: Anterior Nasal Swab  Result Value Ref Range   SARS Coronavirus 2 by RT PCR NEGATIVE NEGATIVE   Influenza A by PCR NEGATIVE NEGATIVE   Influenza B by PCR NEGATIVE NEGATIVE   Resp Syncytial Virus by PCR  NEGATIVE NEGATIVE  CBC with Differential/Platelet   Collection Time: 02/24/24  3:47 AM  Result Value Ref Range   WBC 17.4 (H) 4.0 - 10.5 K/uL   RBC 4.84 3.87 - 5.11 MIL/uL   Hemoglobin 14.0 12.0 - 15.0 g/dL   HCT 40.9 81.1 - 91.4 %   MCV 86.4 80.0 - 100.0 fL   MCH 28.9 26.0 - 34.0 pg   MCHC 33.5 30.0 - 36.0 g/dL   RDW 78.2 95.6 - 21.3 %   Platelets 252 150 - 400 K/uL   nRBC 0.0 0.0 - 0.2 %   Neutrophils Relative % 74 %   Neutro Abs 13.0 (H) 1.7 - 7.7 K/uL   Lymphocytes Relative 14 %   Lymphs Abs 2.4 0.7 - 4.0 K/uL   Monocytes Relative 10 %   Monocytes Absolute 1.7 (H) 0.1 - 1.0 K/uL   Eosinophils Relative 1 %   Eosinophils Absolute 0.1 0.0 - 0.5 K/uL   Basophils Relative 1 %   Basophils Absolute 0.1 0.0 - 0.1 K/uL   Immature Granulocytes 0 %   Abs Immature Granulocytes 0.07 0.00 - 0.07 K/uL  Basic metabolic panel   Collection Time: 02/24/24  3:47 AM  Result Value Ref Range   Sodium 132 (L) 135 - 145 mmol/L   Potassium 3.6 3.5 - 5.1 mmol/L   Chloride 101 98 - 111 mmol/L   CO2 21 (L) 22 - 32 mmol/L   Glucose, Bld 105 (H) 70 - 99 mg/dL   BUN 8 6 - 20 mg/dL   Creatinine, Ser 0.86 0.44 - 1.00 mg/dL   Calcium 8.7 (L) 8.9 - 10.3 mg/dL   GFR, Estimated >57 >84 mL/min   Anion gap 10 5 - 15  hCG, quantitative, pregnancy   Collection Time: 02/24/24  3:47 AM  Result Value Ref Range   hCG, Beta Chain, Quant, S 1 <5 mIU/mL      Assessment & Plan:   Problem List Items Addressed This Visit       Other   Vitamin D  deficiency   Relevant Orders   Vitamin D  (25 hydroxy)   Other Visit Diagnoses       Annual physical exam    -  Primary   Health maintenance reviewed during visit today.  Labs ordered.  Vaccines reviewed.  PAP up to date.   Relevant Orders   CBC with Differential/Platelet   Comprehensive metabolic panel with GFR   Lipid panel   TSH     Screening for ischemic heart disease       Relevant Orders   Lipid panel  Follow up plan: Return in about 1 year  (around 03/24/2025) for Physical and Fasting labs.   LABORATORY TESTING:  - Pap smear: up to date  IMMUNIZATIONS:   - Tdap: Tetanus vaccination status reviewed: last tetanus booster within 10 years. - Influenza: Refused - Pneumovax: Refused - Prevnar: Refused - COVID: Refused - HPV: Refused - Shingrix vaccine: Not applicable  SCREENING: -Mammogram: Not applicable  - Colonoscopy: Not applicable  - Bone Density: Not applicable  -Hearing Test: Not applicable  -Spirometry: Not applicable   PATIENT COUNSELING:   Advised to take 1 mg of folate supplement per day if capable of pregnancy.   Sexuality: Discussed sexually transmitted diseases, partner selection, use of condoms, avoidance of unintended pregnancy  and contraceptive alternatives.   Advised to avoid cigarette smoking.  I discussed with the patient that most people either abstain from alcohol or drink within safe limits (<=14/week and <=4 drinks/occasion for males, <=7/weeks and <= 3 drinks/occasion for females) and that the risk for alcohol disorders and other health effects rises proportionally with the number of drinks per week and how often a drinker exceeds daily limits.  Discussed cessation/primary prevention of drug use and availability of treatment for abuse.   Diet: Encouraged to adjust caloric intake to maintain  or achieve ideal body weight, to reduce intake of dietary saturated fat and total fat, to limit sodium intake by avoiding high sodium foods and not adding table salt, and to maintain adequate dietary potassium and calcium preferably from fresh fruits, vegetables, and low-fat dairy products.    stressed the importance of regular exercise  Injury prevention: Discussed safety belts, safety helmets, smoke detector, smoking near bedding or upholstery.   Dental health: Discussed importance of regular tooth brushing, flossing, and dental visits.    NEXT PREVENTATIVE PHYSICAL DUE IN 1 YEAR. Return in about 1  year (around 03/24/2025) for Physical and Fasting labs.

## 2024-03-25 LAB — CBC WITH DIFFERENTIAL/PLATELET
Basophils Absolute: 0.1 10*3/uL (ref 0.0–0.2)
Basos: 1 %
EOS (ABSOLUTE): 0.5 10*3/uL — ABNORMAL HIGH (ref 0.0–0.4)
Eos: 5 %
Hematocrit: 41.4 % (ref 34.0–46.6)
Hemoglobin: 13.2 g/dL (ref 11.1–15.9)
Immature Grans (Abs): 0 10*3/uL (ref 0.0–0.1)
Immature Granulocytes: 0 %
Lymphocytes Absolute: 3.8 10*3/uL — ABNORMAL HIGH (ref 0.7–3.1)
Lymphs: 33 %
MCH: 28.5 pg (ref 26.6–33.0)
MCHC: 31.9 g/dL (ref 31.5–35.7)
MCV: 89 fL (ref 79–97)
Monocytes Absolute: 0.6 10*3/uL (ref 0.1–0.9)
Monocytes: 5 %
Neutrophils Absolute: 6.4 10*3/uL (ref 1.4–7.0)
Neutrophils: 56 %
Platelets: 344 10*3/uL (ref 150–450)
RBC: 4.63 x10E6/uL (ref 3.77–5.28)
RDW: 13.8 % (ref 11.7–15.4)
WBC: 11.5 10*3/uL — ABNORMAL HIGH (ref 3.4–10.8)

## 2024-03-25 LAB — COMPREHENSIVE METABOLIC PANEL WITH GFR
ALT: 20 IU/L (ref 0–32)
AST: 14 IU/L (ref 0–40)
Albumin: 4.2 g/dL (ref 3.9–4.9)
Alkaline Phosphatase: 89 IU/L (ref 44–121)
BUN/Creatinine Ratio: 11 (ref 9–23)
BUN: 9 mg/dL (ref 6–20)
Bilirubin Total: <0.2 mg/dL (ref 0.0–1.2)
CO2: 20 mmol/L (ref 20–29)
Calcium: 9.6 mg/dL (ref 8.7–10.2)
Chloride: 105 mmol/L (ref 96–106)
Creatinine, Ser: 0.8 mg/dL (ref 0.57–1.00)
Globulin, Total: 3.1 g/dL (ref 1.5–4.5)
Glucose: 111 mg/dL — ABNORMAL HIGH (ref 70–99)
Potassium: 4 mmol/L (ref 3.5–5.2)
Sodium: 139 mmol/L (ref 134–144)
Total Protein: 7.3 g/dL (ref 6.0–8.5)
eGFR: 99 mL/min/{1.73_m2} (ref >59)

## 2024-03-25 LAB — LIPID PANEL
Chol/HDL Ratio: 4 ratio (ref 0.0–4.4)
Cholesterol, Total: 198 mg/dL (ref 100–199)
HDL: 49 mg/dL (ref >39)
LDL Chol Calc (NIH): 118 mg/dL — ABNORMAL HIGH (ref 0–99)
Triglycerides: 179 mg/dL — ABNORMAL HIGH (ref 0–149)
VLDL Cholesterol Cal: 31 mg/dL (ref 5–40)

## 2024-03-25 LAB — TSH: TSH: 0.869 u[IU]/mL (ref 0.450–4.500)

## 2024-03-25 LAB — VITAMIN D 25 HYDROXY (VIT D DEFICIENCY, FRACTURES): Vit D, 25-Hydroxy: 17.2 ng/mL — ABNORMAL LOW (ref 30.0–100.0)

## 2024-03-27 ENCOUNTER — Ambulatory Visit: Admitting: Nurse Practitioner

## 2024-03-27 ENCOUNTER — Encounter: Payer: Self-pay | Admitting: Nurse Practitioner

## 2024-03-27 NOTE — Progress Notes (Deleted)
 LMP 02/28/2024 (Approximate)    Subjective:    Patient ID: Samantha Villegas, female    DOB: 1991-08-01, 33 y.o.   MRN: [id number]  HPI: Samantha Villegas is a 33 y.o. female  No chief complaint on file.  HEADACHES Duration: {Blank single:19197::"chronic","days","weeks","months","years"} Onset: {Blank single:19197::"sudden","gradual"} Severity: {Blank single:19197::"mild","moderate","severe","1/10","2/10","3/10","4/10","5/10","6/10","7/10","8/10","9/10","10/10"} Quality: {Blank multiple:19196::"sharp","dull","aching","burning","cramping","ill-defined","itchy","pressure-like","pulling","shooting","sore","stabbing","tender","tearing","throbbing"} Frequency: {Blank single:19197::"constant","intermittent","occasional","rare","every few minutes","a few times a hour","a few times a day","a few times a week","a few times a month","a few times a year"} Location:  Headache duration: Radiation: {Blank single:19197::"yes","no"} Time of day headache occurs:  Alleviating factors:  Aggravating factors:  Headache status at time of visit: {Blank single:19197::"current headache","asymptomatic"} Treatments attempted: Treatments attempted: {Blank multiple:19196::"none","rest","ice","heat","APAP","ibuprofen ","aleve", excedrine","triptans","propranolol","topamax","amitriptyline"}   Aura: {Blank single:19197::"yes","no"} Nausea:  {Blank single:19197::"yes","no"} Vomiting: {Blank single:19197::"yes","no"} Photophobia:  {Blank single:19197::"yes","no"} Phonophobia:  {Blank single:19197::"yes","no"} Effect on social functioning:  {Blank single:19197::"yes","no"} Numbers of missed days of school/work each month:  Confusion:  {Blank single:19197::"yes","no"} Gait disturbance/ataxia:  {Blank single:19197::"yes","no"} Behavioral changes:  {Blank single:19197::"yes","no"} Fevers:  {Blank single:19197::"yes","no"}   WEIGHT GAIN Duration:  Previous attempts at weight loss: {Blank  single:19197::"yes","no"} Complications of obesity:  Peak weight:  Weight loss goal:  Weight loss to date:  Requesting obesity pharmacotherapy: {Blank single:19197::"yes","no"} Current weight loss supplements/medications: {Blank single:19197::"yes","no"} Previous weight loss supplements/meds: {Blank single:19197::"yes","no"} Calories:    Relevant past medical, surgical, family and social history reviewed and updated as indicated. Interim medical history since our last visit reviewed. Allergies and medications reviewed and updated.  Review of Systems  Per HPI unless specifically indicated above     Objective:    LMP 02/28/2024 (Approximate)   Wt Readings from Last 3 Encounters:  03/24/24 258 lb (117 kg)  02/23/24 260 lb (117.9 kg)  02/03/24 256 lb 6.4 oz (116.3 kg)    Physical Exam  Results for orders placed or performed in visit on 03/24/24  CBC with Differential/Platelet   Collection Time: 03/24/24 10:15 AM  Result Value Ref Range   WBC 11.5 (H) 3.4 - 10.8 x10E3/uL   RBC 4.63 3.77 - 5.28 x10E6/uL   Hemoglobin 13.2 11.1 - 15.9 g/dL   Hematocrit 65.7 84.6 - 46.6 %   MCV 89 79 - 97 fL   MCH 28.5 26.6 - 33.0 pg   MCHC 31.9 31.5 - 35.7 g/dL   RDW 96.2 95.2 - 84.1 %   Platelets 344 150 - 450 x10E3/uL   Neutrophils 56 Not Estab. %   Lymphs 33 Not Estab. %   Monocytes 5 Not Estab. %   Eos 5 Not Estab. %   Basos 1 Not Estab. %   Neutrophils Absolute 6.4 1.4 - 7.0 x10E3/uL   Lymphocytes Absolute 3.8 (H) 0.7 - 3.1 x10E3/uL   Monocytes Absolute 0.6 0.1 - 0.9 x10E3/uL   EOS (ABSOLUTE) 0.5 (H) 0.0 - 0.4 x10E3/uL   Basophils Absolute 0.1 0.0 - 0.2 x10E3/uL   Immature Granulocytes 0 Not Estab. %   Immature Grans (Abs) 0.0 0.0 - 0.1 x10E3/uL  Comprehensive metabolic panel with GFR   Collection Time: 03/24/24 10:15 AM  Result Value Ref Range   Glucose 111 (H) 70 - 99 mg/dL   BUN 9 6 - 20 mg/dL   Creatinine, Ser 3.24 0.57 - 1.00 mg/dL   eGFR 99 >40 NU/UVO/5.36    BUN/Creatinine Ratio 11 9 - 23   Sodium 139 134 - 144 mmol/L   Potassium 4.0 3.5 - 5.2 mmol/L   Chloride 105 96 - 106 mmol/L   CO2 20 20 - 29 mmol/L   Calcium 9.6  8.7 - 10.2 mg/dL   Total Protein 7.3 6.0 - 8.5 g/dL   Albumin 4.2 3.9 - 4.9 g/dL   Globulin, Total 3.1 1.5 - 4.5 g/dL   Bilirubin Total <1.6 0.0 - 1.2 mg/dL   Alkaline Phosphatase 89 44 - 121 IU/L   AST 14 0 - 40 IU/L   ALT 20 0 - 32 IU/L  Lipid panel   Collection Time: 03/24/24 10:15 AM  Result Value Ref Range   Cholesterol, Total 198 100 - 199 mg/dL   Triglycerides 109 (H) 0 - 149 mg/dL   HDL 49 >60 mg/dL   VLDL Cholesterol Cal 31 5 - 40 mg/dL   LDL Chol Calc (NIH) 454 (H) 0 - 99 mg/dL   Chol/HDL Ratio 4.0 0.0 - 4.4 ratio  TSH   Collection Time: 03/24/24 10:15 AM  Result Value Ref Range   TSH 0.869 0.450 - 4.500 uIU/mL  Vitamin D  (25 hydroxy)   Collection Time: 03/24/24 10:15 AM  Result Value Ref Range   Vit D, 25-Hydroxy 17.2 (L) 30.0 - 100.0 ng/mL      Assessment & Plan:   Problem List Items Addressed This Visit   None    Follow up plan: No follow-ups on file.

## 2024-07-05 ENCOUNTER — Other Ambulatory Visit: Payer: Self-pay | Admitting: Nurse Practitioner

## 2024-07-05 NOTE — Telephone Encounter (Signed)
 Requested Prescriptions  Pending Prescriptions Disp Refills   VENTOLIN  HFA 108 (90 Base) MCG/ACT inhaler [Pharmacy Med Name: VENTOLIN  HFA 90 MCG INHALER] 18 each 3    Sig: TAKE 2 PUFFS BY MOUTH EVERY 6 HOURS AS NEEDED FOR WHEEZE OR SHORTNESS OF BREATH     Pulmonology:  Beta Agonists 2 Passed - 07/05/2024  3:24 PM      Passed - Last BP in normal range    BP Readings from Last 1 Encounters:  03/24/24 104/72         Passed - Last Heart Rate in normal range    Pulse Readings from Last 1 Encounters:  03/24/24 (!) 108         Passed - Valid encounter within last 12 months    Recent Outpatient Visits           3 months ago Annual physical exam   [facility] Melvin Pao, NP   5 months ago Sore throat   [facility] Herold Hadassah SQUIBB, MD

## 2024-10-24 ENCOUNTER — Other Ambulatory Visit (HOSPITAL_COMMUNITY): Payer: Self-pay

## 2024-10-24 ENCOUNTER — Encounter: Payer: Self-pay | Admitting: Nurse Practitioner

## 2024-10-24 ENCOUNTER — Telehealth (INDEPENDENT_AMBULATORY_CARE_PROVIDER_SITE_OTHER): Admitting: Nurse Practitioner

## 2024-10-24 VITALS — Ht 65.3 in | Wt 253.0 lb

## 2024-10-24 DIAGNOSIS — E66813 Obesity, class 3: Secondary | ICD-10-CM | POA: Diagnosis not present

## 2024-10-24 DIAGNOSIS — R21 Rash and other nonspecific skin eruption: Secondary | ICD-10-CM | POA: Diagnosis not present

## 2024-10-24 DIAGNOSIS — E559 Vitamin D deficiency, unspecified: Secondary | ICD-10-CM

## 2024-10-24 DIAGNOSIS — F17210 Nicotine dependence, cigarettes, uncomplicated: Secondary | ICD-10-CM

## 2024-10-24 DIAGNOSIS — Z6841 Body Mass Index (BMI) 40.0 and over, adult: Secondary | ICD-10-CM

## 2024-10-24 MED ORDER — VITAMIN D3 125 MCG (5000 UT) PO CAPS: 5000.0000 [IU] | ORAL_CAPSULE | Freq: Every day | ORAL | 0 refills | Status: AC

## 2024-10-24 MED ORDER — NICOTINE 21 MG/24HR TD PT24: 21.0000 mg | MEDICATED_PATCH | Freq: Every day | TRANSDERMAL | 0 refills | Status: AC

## 2024-10-24 MED ORDER — WEGOVY 0.25 MG/0.5ML ~~LOC~~ SOAJ: 0.2500 mg | SUBCUTANEOUS | 0 refills | Status: DC

## 2024-10-24 MED ORDER — TRIAMCINOLONE ACETONIDE 0.1 % EX CREA: 1.0000 | TOPICAL_CREAM | Freq: Two times a day (BID) | CUTANEOUS | 0 refills | Status: AC

## 2024-10-24 NOTE — Assessment & Plan Note (Signed)
 Chronic.  Not well controlled.  Patient has tried diet and exercise for weight loss without success.  Will start [facility] 0.25mg  weekly.  Will increase to [facility] 0.5mg  weekly after the first 4 weeks.  Discussed how to inject medication.  Discussed side effects and benefits of medication.  Follow up in 1 month.  Call sooner if concerns arise.

## 2024-10-24 NOTE — Assessment & Plan Note (Signed)
 Chronic. Has trouble remembering to take the once weekly dose. Will change to Vitamin D  5000 IU once daily.  Will recheck labs in 1 month since it hasn't been checked since April. Can adjust dose at next visit.

## 2024-10-24 NOTE — Assessment & Plan Note (Signed)
 Discussed cessation and available treatment including nicotine replacement options, pharmacologic treatment, and/or online resources. Based on our discussion, she does plan initiate treatment today. She Plans to initiate tx w/ Nicotine Patch. Total time spent on discussion: 5 minutes.

## 2024-10-24 NOTE — Progress Notes (Signed)
 Called patient lvm for patient to call back and schedule 1 month follow up.

## 2024-10-24 NOTE — Progress Notes (Signed)
 Ht 5' 5.3 (1.659 m)   Wt 253 lb (114.8 kg)   BMI 41.72 kg/m    Subjective:    Patient ID: Samantha Villegas, female    DOB: 06/15/1991, 33 y.o.   MRN: [id number]  HPI: Samantha Villegas is a 33 y.o. female  Chief Complaint  Patient presents with   Rash    Patient states she has rashes on her face that come and go. States that the rashes do itch. States she has been experiencing this for the last 2 weeks.    Weight Management Screening    Patient states she wants to discuss starting on something for weight loss. States she has not taken any weight loss medications in the past.    WEIGHT GAIN Patient has been working on diet.  She is counting carbs and eating less than 2000 calories per day. No fried foods and measuring out foods. She plans to add in exercise soon. Duration: years Previous attempts at weight loss: yes Complications of obesity: fatigue Peak weight: 304 lb Weight loss goal: 200lb Weight loss to date: 1 year Requesting obesity pharmacotherapy: yes Current weight loss supplements/medications: no Previous weight loss supplements/meds: yes Calories:   Patient states her rash started about 2 weeks ago.  She is having redness above her eyes, chin and cheeks.  She states it is itchy. No change to soaps lotions or detergents.  She is using alcohol to dry it out and it has helped some but not a lot.    SMOKING CESSATION Smoking Status: current everyday smoker Smoking Amount: 1 pack of black and milds per day Smoking Onset: 17 years Smoking Quit Date:  Smoking triggers: stress Type of tobacco use: black and milds    Relevant past medical, surgical, family and social history reviewed and updated as indicated. Interim medical history since our last visit reviewed. Allergies and medications reviewed and updated.  Review of Systems  Constitutional:  Positive for unexpected weight change.  Skin:  Positive for rash.    Per HPI unless specifically  indicated above     Objective:    Ht 5' 5.3 (1.659 m)   Wt 253 lb (114.8 kg)   BMI 41.72 kg/m   Wt Readings from Last 3 Encounters:  10/24/24 253 lb (114.8 kg)  03/24/24 258 lb (117 kg)  02/23/24 260 lb (117.9 kg)    Physical Exam Vitals and nursing note reviewed.  HENT:     Head: Normocephalic.     Right Ear: Hearing normal.     Left Ear: Hearing normal.     Nose: Nose normal.  Eyes:     Pupils: Pupils are equal, round, and reactive to light.  Pulmonary:     Effort: Pulmonary effort is normal. No respiratory distress.  Neurological:     Mental Status: She is alert.  Psychiatric:        Mood and Affect: Mood normal.        Behavior: Behavior normal.        Thought Content: Thought content normal.        Judgment: Judgment normal.     Results for orders placed or performed in visit on 03/24/24  CBC with Differential/Platelet   Collection Time: 03/24/24 10:15 AM  Result Value Ref Range   WBC 11.5 (H) 3.4 - 10.8 x10E3/uL   RBC 4.63 3.77 - 5.28 x10E6/uL   Hemoglobin 13.2 11.1 - 15.9 g/dL   Hematocrit 58.5 65.9 - 46.6 %   MCV 89  79 - 97 fL   MCH 28.5 26.6 - 33.0 pg   MCHC 31.9 31.5 - 35.7 g/dL   RDW 86.1 88.2 - 84.5 %   Platelets 344 150 - 450 x10E3/uL   Neutrophils 56 Not Estab. %   Lymphs 33 Not Estab. %   Monocytes 5 Not Estab. %   Eos 5 Not Estab. %   Basos 1 Not Estab. %   Neutrophils Absolute 6.4 1.4 - 7.0 x10E3/uL   Lymphocytes Absolute 3.8 (H) 0.7 - 3.1 x10E3/uL   Monocytes Absolute 0.6 0.1 - 0.9 x10E3/uL   EOS (ABSOLUTE) 0.5 (H) 0.0 - 0.4 x10E3/uL   Basophils Absolute 0.1 0.0 - 0.2 x10E3/uL   Immature Granulocytes 0 Not Estab. %   Immature Grans (Abs) 0.0 0.0 - 0.1 x10E3/uL  Comprehensive metabolic panel with GFR   Collection Time: 03/24/24 10:15 AM  Result Value Ref Range   Glucose 111 (H) 70 - 99 mg/dL   BUN 9 6 - 20 mg/dL   Creatinine, Ser 9.19 0.57 - 1.00 mg/dL   eGFR 99 >40 fO/fpw/8.26   BUN/Creatinine Ratio 11 9 - 23   Sodium 139 134 -  144 mmol/L   Potassium 4.0 3.5 - 5.2 mmol/L   Chloride 105 96 - 106 mmol/L   CO2 20 20 - 29 mmol/L   Calcium 9.6 8.7 - 10.2 mg/dL   Total Protein 7.3 6.0 - 8.5 g/dL   Albumin 4.2 3.9 - 4.9 g/dL   Globulin, Total 3.1 1.5 - 4.5 g/dL   Bilirubin Total <9.7 0.0 - 1.2 mg/dL   Alkaline Phosphatase 89 44 - 121 IU/L   AST 14 0 - 40 IU/L   ALT 20 0 - 32 IU/L  Lipid panel   Collection Time: 03/24/24 10:15 AM  Result Value Ref Range   Cholesterol, Total 198 100 - 199 mg/dL   Triglycerides 820 (H) 0 - 149 mg/dL   HDL 49 >60 mg/dL   VLDL Cholesterol Cal 31 5 - 40 mg/dL   LDL Chol Calc (NIH) 881 (H) 0 - 99 mg/dL   Chol/HDL Ratio 4.0 0.0 - 4.4 ratio  TSH   Collection Time: 03/24/24 10:15 AM  Result Value Ref Range   TSH 0.869 0.450 - 4.500 uIU/mL  Vitamin D  (25 hydroxy)   Collection Time: 03/24/24 10:15 AM  Result Value Ref Range   Vit D, 25-Hydroxy 17.2 (L) 30.0 - 100.0 ng/mL      Assessment & Plan:   Problem List Items Addressed This Visit       Other   Obesity - Primary   Chronic.  Not well controlled.  Patient has tried diet and exercise for weight loss without success.  Will start [facility] 0.25mg  weekly.  Will increase to [facility] 0.5mg  weekly after the first 4 weeks.  Discussed how to inject medication.  Discussed side effects and benefits of medication.  Follow up in 1 month.  Call sooner if concerns arise.         Relevant Medications   semaglutide-weight management (WEGOVY) 0.25 MG/0.5ML SOAJ SQ injection   Nicotine dependence, cigarettes, uncomplicated   Discussed cessation and available treatment including nicotine replacement options, pharmacologic treatment, and/or online resources. Based on our discussion, she does plan initiate treatment today. She Plans to initiate tx w/ Nicotine Patch. Total time spent on discussion: 5 minutes.        Relevant Medications   nicotine (NICODERM CQ - DOSED IN MG/24 HOURS) 21 mg/24hr patch   Vitamin D  deficiency  Chronic. Has trouble  remembering to take the once weekly dose. Will change to Vitamin D  5000 IU once daily.  Will recheck labs in 1 month since it hasn't been checked since April. Can adjust dose at next visit.      Other Visit Diagnoses       Rash       Will treat with triamcinalone. Discussed thin layer on face to help with symptoms. Follow up if not improved.        Follow up plan: Return in about 1 month (around 11/23/2024) for Smoking and weight .   This visit was completed via MyChart due to the restrictions of the COVID-19 pandemic. All issues as above were discussed and addressed. Physical exam was done as above through visual confirmation on MyChart. If it was felt that the patient should be evaluated in the office, they were directed there. The patient verbally consented to this visit. Location of the patient: Home Location of the provider: Office Those involved with this call:  Provider: Darice Petty, NP CMA: Laymon Metro, CMA Front Desk/Registration: Claretta Maiden This encounter was conducted via video.  I spent 30 minutes  dedicated to the care of this patient on the date of this encounter to include previsit review of plan of care, medications, follow up, face to face time with the patient, and post visit ordering of testing.

## 2024-10-25 ENCOUNTER — Other Ambulatory Visit (HOSPITAL_COMMUNITY): Payer: Self-pay

## 2024-10-25 ENCOUNTER — Telehealth: Payer: Self-pay

## 2024-10-25 NOTE — Telephone Encounter (Signed)
 Pharmacy Patient Advocate Encounter   Received notification from Onbase that prior authorization for Wegovy  0.25MG /0.5ML auto-injectors is required/requested.   Insurance verification completed.   The patient is insured through [facility].   Per test claim: PA required; PA submitted to above mentioned insurance via Latent Key/confirmation #/EOC [id number] Status is pending

## 2024-10-26 NOTE — Telephone Encounter (Signed)
 Pharmacy Patient Advocate Encounter  Received notification from [employer] [facility] that Prior Authorization for Wegovy  0.25MG /0.5ML auto-injectors  has been APPROVED from 10/25/24 to 05/25/25

## 2024-11-23 ENCOUNTER — Encounter: Payer: Self-pay | Admitting: Nurse Practitioner

## 2024-11-23 ENCOUNTER — Ambulatory Visit: Admitting: Nurse Practitioner

## 2024-11-23 VITALS — BP 108/71 | HR 62 | Temp 99.2°F | Ht 65.32 in | Wt 252.0 lb

## 2024-11-23 DIAGNOSIS — F17210 Nicotine dependence, cigarettes, uncomplicated: Secondary | ICD-10-CM

## 2024-11-23 DIAGNOSIS — E66813 Obesity, class 3: Secondary | ICD-10-CM | POA: Diagnosis not present

## 2024-11-23 DIAGNOSIS — R300 Dysuria: Secondary | ICD-10-CM | POA: Diagnosis not present

## 2024-11-23 DIAGNOSIS — B9689 Other specified bacterial agents as the cause of diseases classified elsewhere: Secondary | ICD-10-CM | POA: Diagnosis not present

## 2024-11-23 DIAGNOSIS — Z6841 Body Mass Index (BMI) 40.0 and over, adult: Secondary | ICD-10-CM | POA: Diagnosis not present

## 2024-11-23 DIAGNOSIS — N760 Acute vaginitis: Secondary | ICD-10-CM

## 2024-11-23 LAB — WET PREP FOR TRICH, YEAST, CLUE
Clue Cell Exam: POSITIVE — AB
Trichomonas Exam: NEGATIVE
Yeast Exam: NEGATIVE

## 2024-11-23 LAB — URINALYSIS, ROUTINE W REFLEX MICROSCOPIC
Bilirubin, UA: NEGATIVE
Glucose, UA: NEGATIVE
Ketones, UA: NEGATIVE
Leukocytes,UA: NEGATIVE
Nitrite, UA: NEGATIVE
Protein,UA: NEGATIVE
RBC, UA: NEGATIVE
Specific Gravity, UA: 1.025 (ref 1.005–1.030)
Urobilinogen, Ur: 0.2 mg/dL (ref 0.2–1.0)
pH, UA: 5.5 (ref 5.0–7.5)

## 2024-11-23 MED ORDER — METRONIDAZOLE 500 MG PO TABS: 500.0000 mg | ORAL_TABLET | Freq: Two times a day (BID) | ORAL | 0 refills | Status: AC

## 2024-11-23 MED ORDER — WEGOVY 0.5 MG/0.5ML ~~LOC~~ SOAJ: 0.5000 mg | SUBCUTANEOUS | 2 refills | Status: AC

## 2024-11-23 NOTE — Progress Notes (Signed)
 BP 108/71 (BP Location: Right Wrist, Cuff Size: Large)   Pulse 62   Temp 99.2 F (37.3 C) (Oral)   Ht 5' 5.32 (1.659 m)   Wt 252 lb (114.3 kg)   SpO2 99%   BMI 41.53 kg/m    Subjective:    Patient ID: Samantha Villegas, female    DOB: January 11, 1991, 33 y.o.   MRN: [id number]  HPI: Samantha Villegas is a 33 y.o. female  Chief Complaint  Patient presents with   Nicotine  Dependence   Weight Management Screening    1 month F/u   Urinary Tract Infection    Patient stated she would like to be tested for UTI or BV because she is having some burning when she urinates.    WEIGHT GAIN Patient has been working on diet.  She is counting carbs and eating less than 2000 calories per day. No fried foods and measuring out foods. She plans to add in exercise soon. Duration: years Previous attempts at weight loss: yes Complications of obesity: fatigue Peak weight: 304 lb Weight loss goal: 200lb Weight loss to date: 1 year Requesting obesity pharmacotherapy: yes Current weight loss supplements/medications: no Previous weight loss supplements/meds: yes Calories:   SMOKING CESSATION Patient has been 21 days tomorrow smoke free. Smoking Status: current everyday smoker Smoking Amount: 1 pack of black and milds per day Smoking Onset: 17 years Smoking Quit Date:  Smoking triggers: stress Type of tobacco use: black and milds  URINARY SYMPTOMS Symptoms have been going on for the last week Dysuria: yes Urinary frequency: yes Urgency: yes Small volume voids: yes Symptom severity: no Urinary incontinence: no Foul odor: no Hematuria: no Abdominal pain: no Back pain: no Suprapubic pain/pressure: yes Flank pain: no Fever:  no Vomiting: no Relief with cranberry juice: no Relief with pyridium : no Status: stable Previous urinary tract infection: no Recurrent urinary tract infection: no   Relevant past medical, surgical, family and social history reviewed and updated as  indicated. Interim medical history since our last visit reviewed. Allergies and medications reviewed and updated.  Review of Systems  Constitutional:  Positive for unexpected weight change. Negative for fever.  Gastrointestinal:  Positive for abdominal pain. Negative for vomiting.  Genitourinary:  Positive for decreased urine volume, dysuria, frequency and urgency. Negative for flank pain and hematuria.  Musculoskeletal:  Negative for back pain.    Per HPI unless specifically indicated above     Objective:    BP 108/71 (BP Location: Right Wrist, Cuff Size: Large)   Pulse 62   Temp 99.2 F (37.3 C) (Oral)   Ht 5' 5.32 (1.659 m)   Wt 252 lb (114.3 kg)   SpO2 99%   BMI 41.53 kg/m   Wt Readings from Last 3 Encounters:  11/23/24 252 lb (114.3 kg)  10/24/24 253 lb (114.8 kg)  03/24/24 258 lb (117 kg)    Physical Exam Vitals and nursing note reviewed.  Constitutional:      General: She is not in acute distress.    Appearance: Normal appearance. She is not ill-appearing, toxic-appearing or diaphoretic.  HENT:     Head: Normocephalic.     Right Ear: External ear normal.     Left Ear: External ear normal.     Nose: Nose normal.     Mouth/Throat:     Mouth: Mucous membranes are moist.     Pharynx: Oropharynx is clear.  Eyes:     General:        Right  eye: No discharge.        Left eye: No discharge.     Extraocular Movements: Extraocular movements intact.     Conjunctiva/sclera: Conjunctivae normal.     Pupils: Pupils are equal, round, and reactive to light.  Cardiovascular:     Rate and Rhythm: Normal rate and regular rhythm.     Heart sounds: No murmur heard. Pulmonary:     Effort: Pulmonary effort is normal. No respiratory distress.     Breath sounds: Normal breath sounds. No wheezing or rales.  Musculoskeletal:     Cervical back: Normal range of motion and neck supple.  Skin:    General: Skin is warm and dry.     Capillary Refill: Capillary refill takes less than  2 seconds.  Neurological:     General: No focal deficit present.     Mental Status: She is alert and oriented to person, place, and time. Mental status is at baseline.  Psychiatric:        Mood and Affect: Mood normal.        Behavior: Behavior normal.        Thought Content: Thought content normal.        Judgment: Judgment normal.     Results for orders placed or performed in visit on 03/24/24  CBC with Differential/Platelet   Collection Time: 03/24/24 10:15 AM  Result Value Ref Range   WBC 11.5 (H) 3.4 - 10.8 x10E3/uL   RBC 4.63 3.77 - 5.28 x10E6/uL   Hemoglobin 13.2 11.1 - 15.9 g/dL   Hematocrit 58.5 65.9 - 46.6 %   MCV 89 79 - 97 fL   MCH 28.5 26.6 - 33.0 pg   MCHC 31.9 31.5 - 35.7 g/dL   RDW 86.1 88.2 - 84.5 %   Platelets 344 150 - 450 x10E3/uL   Neutrophils 56 Not Estab. %   Lymphs 33 Not Estab. %   Monocytes 5 Not Estab. %   Eos 5 Not Estab. %   Basos 1 Not Estab. %   Neutrophils Absolute 6.4 1.4 - 7.0 x10E3/uL   Lymphocytes Absolute 3.8 (H) 0.7 - 3.1 x10E3/uL   Monocytes Absolute 0.6 0.1 - 0.9 x10E3/uL   EOS (ABSOLUTE) 0.5 (H) 0.0 - 0.4 x10E3/uL   Basophils Absolute 0.1 0.0 - 0.2 x10E3/uL   Immature Granulocytes 0 Not Estab. %   Immature Grans (Abs) 0.0 0.0 - 0.1 x10E3/uL  Comprehensive metabolic panel with GFR   Collection Time: 03/24/24 10:15 AM  Result Value Ref Range   Glucose 111 (H) 70 - 99 mg/dL   BUN 9 6 - 20 mg/dL   Creatinine, Ser 9.19 0.57 - 1.00 mg/dL   eGFR 99 >40 fO/fpw/8.26   BUN/Creatinine Ratio 11 9 - 23   Sodium 139 134 - 144 mmol/L   Potassium 4.0 3.5 - 5.2 mmol/L   Chloride 105 96 - 106 mmol/L   CO2 20 20 - 29 mmol/L   Calcium 9.6 8.7 - 10.2 mg/dL   Total Protein 7.3 6.0 - 8.5 g/dL   Albumin 4.2 3.9 - 4.9 g/dL   Globulin, Total 3.1 1.5 - 4.5 g/dL   Bilirubin Total <9.7 0.0 - 1.2 mg/dL   Alkaline Phosphatase 89 44 - 121 IU/L   AST 14 0 - 40 IU/L   ALT 20 0 - 32 IU/L  Lipid panel   Collection Time: 03/24/24 10:15 AM  Result Value  Ref Range   Cholesterol, Total 198 100 - 199 mg/dL   Triglycerides 820 (  H) 0 - 149 mg/dL   HDL 49 >60 mg/dL   VLDL Cholesterol Cal 31 5 - 40 mg/dL   LDL Chol Calc (NIH) 881 (H) 0 - 99 mg/dL   Chol/HDL Ratio 4.0 0.0 - 4.4 ratio  TSH   Collection Time: 03/24/24 10:15 AM  Result Value Ref Range   TSH 0.869 0.450 - 4.500 uIU/mL  Vitamin D  (25 hydroxy)   Collection Time: 03/24/24 10:15 AM  Result Value Ref Range   Vit D, 25-Hydroxy 17.2 (L) 30.0 - 100.0 ng/mL      Assessment & Plan:   Problem List Items Addressed This Visit       Other   Obesity - Primary   Chronic. On Wegovy  and tolerating the medication well.  Will increase dose to 0.5mg  weekly.  Follow up in 4 months.  Call sooner if concerns arise.       Relevant Medications   semaglutide -weight management (WEGOVY ) 0.5 MG/0.5ML SOAJ SQ injection   Nicotine  dependence, cigarettes, uncomplicated   Has been smoke free for 21 days.       Other Visit Diagnoses       Dysuria       Relevant Orders   Urinalysis, Routine w reflex microscopic   WET PREP FOR TRICH, YEAST, CLUE     Bacterial vaginosis       Clue cells seen on wet prep. Will treat with Flagyl  x 7 days. Complete course of medication.  Follow up if not improved.   Relevant Medications   metroNIDAZOLE  (FLAGYL ) 500 MG tablet         Follow up plan: Return in about 4 months (around 03/24/2025) for Physical and Fasting labs.

## 2024-11-23 NOTE — Assessment & Plan Note (Signed)
 Chronic. On Wegovy  and tolerating the medication well.  Will increase dose to 0.5mg  weekly.  Follow up in 4 months.  Call sooner if concerns arise.

## 2024-11-23 NOTE — Assessment & Plan Note (Signed)
 Has been smoke free for 21 days.

## 2024-11-24 ENCOUNTER — Ambulatory Visit: Payer: Self-pay | Admitting: Nurse Practitioner

## 2024-12-04 ENCOUNTER — Other Ambulatory Visit: Payer: Self-pay | Admitting: Nurse Practitioner

## 2025-01-12 ENCOUNTER — Other Ambulatory Visit (HOSPITAL_COMMUNITY): Payer: Self-pay

## 2025-03-26 ENCOUNTER — Ambulatory Visit: Admitting: Nurse Practitioner

## 2025-03-27 ENCOUNTER — Encounter: Admitting: Pediatrics
